# Patient Record
Sex: Female | Born: 1970 | Hispanic: Yes | Smoking: Never smoker
Health system: Southern US, Community
[De-identification: ages and names within clinical notes are randomized; demographics above are authoritative.]

## PROBLEM LIST (undated history)

## (undated) DIAGNOSIS — R7303 Prediabetes: Secondary | ICD-10-CM

## (undated) DIAGNOSIS — J45909 Unspecified asthma, uncomplicated: Secondary | ICD-10-CM

## (undated) DIAGNOSIS — F329 Major depressive disorder, single episode, unspecified: Secondary | ICD-10-CM

## (undated) DIAGNOSIS — J449 Chronic obstructive pulmonary disease, unspecified: Secondary | ICD-10-CM

## (undated) DIAGNOSIS — K219 Gastro-esophageal reflux disease without esophagitis: Secondary | ICD-10-CM

## (undated) DIAGNOSIS — G43909 Migraine, unspecified, not intractable, without status migrainosus: Secondary | ICD-10-CM

## (undated) DIAGNOSIS — F419 Anxiety disorder, unspecified: Secondary | ICD-10-CM

## (undated) DIAGNOSIS — F32A Depression, unspecified: Secondary | ICD-10-CM

## (undated) DIAGNOSIS — G5600 Carpal tunnel syndrome, unspecified upper limb: Secondary | ICD-10-CM

## (undated) DIAGNOSIS — M810 Age-related osteoporosis without current pathological fracture: Secondary | ICD-10-CM

## (undated) DIAGNOSIS — M51379 Other intervertebral disc degeneration, lumbosacral region without mention of lumbar back pain or lower extremity pain: Secondary | ICD-10-CM

## (undated) DIAGNOSIS — J42 Unspecified chronic bronchitis: Secondary | ICD-10-CM

## (undated) DIAGNOSIS — E039 Hypothyroidism, unspecified: Secondary | ICD-10-CM

## (undated) DIAGNOSIS — G4730 Sleep apnea, unspecified: Secondary | ICD-10-CM

## (undated) DIAGNOSIS — I10 Essential (primary) hypertension: Secondary | ICD-10-CM

## (undated) DIAGNOSIS — M5137 Other intervertebral disc degeneration, lumbosacral region: Secondary | ICD-10-CM

## (undated) DIAGNOSIS — E079 Disorder of thyroid, unspecified: Secondary | ICD-10-CM

## (undated) HISTORY — DX: Major depressive disorder, single episode, unspecified: F32.9

## (undated) HISTORY — DX: Depression, unspecified: F32.A

## (undated) HISTORY — DX: Other intervertebral disc degeneration, lumbosacral region: M51.37

## (undated) HISTORY — PX: EYE SURGERY: SHX253

## (undated) HISTORY — PX: FOOT SURGERY: SHX648

## (undated) HISTORY — DX: Unspecified asthma, uncomplicated: J45.909

## (undated) HISTORY — DX: Disorder of thyroid, unspecified: E07.9

## (undated) HISTORY — DX: Essential (primary) hypertension: I10

## (undated) HISTORY — DX: Other intervertebral disc degeneration, lumbosacral region without mention of lumbar back pain or lower extremity pain: M51.379

## (undated) HISTORY — DX: Anxiety disorder, unspecified: F41.9

## (undated) HISTORY — DX: Carpal tunnel syndrome, unspecified upper limb: G56.00

## (undated) HISTORY — DX: Migraine, unspecified, not intractable, without status migrainosus: G43.909

## (undated) HISTORY — PX: CARPAL TUNNEL RELEASE: SHX101

---

## 2016-02-20 IMAGING — US US THYROID
1 series · 14 of 25 positions shown · non-contrast
Comparison: None.

CLINICAL DATA: Hypothyroid. 47-year-old female with a history of
hypothyroidism and thyroid nodules. She reportedly underwent prior
thyroid nodule biopsy in [place].

EXAM:
THYROID ULTRASOUND
TECHNIQUE: Ultrasound examination of the thyroid gland and adjacent soft
tissues was performed.

[Series 1: us thyroid · 0.07mm/px · 14 of 42 slices shown]
[im 1/42]
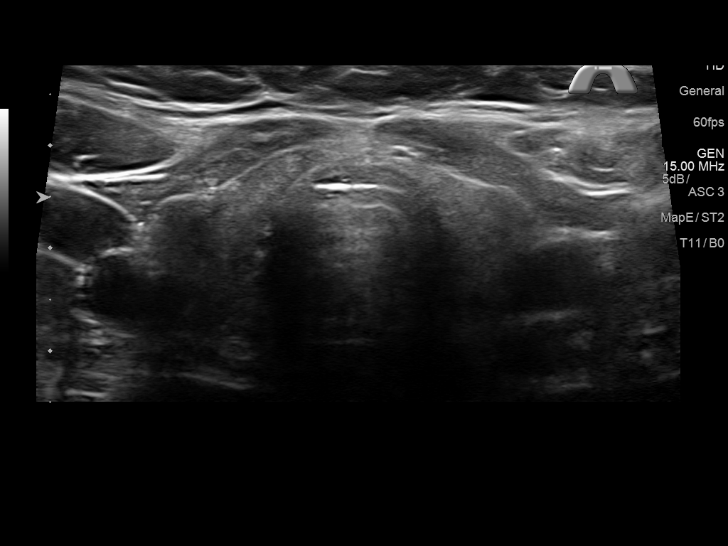
[im 4/42]
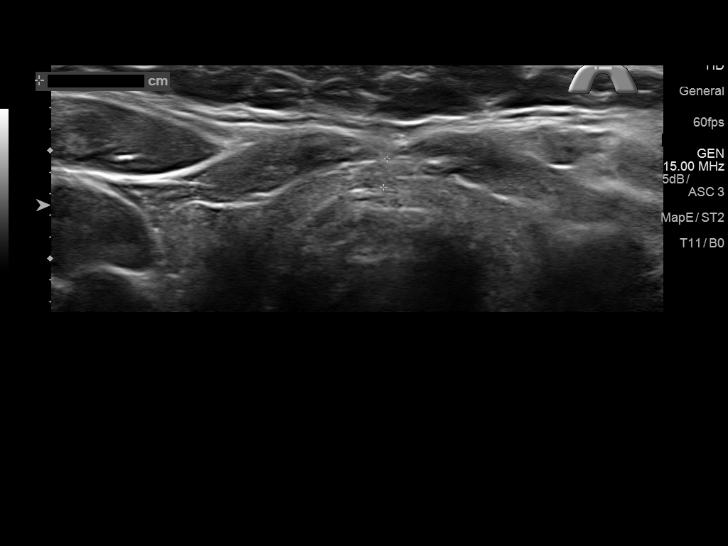
[im 7/42]
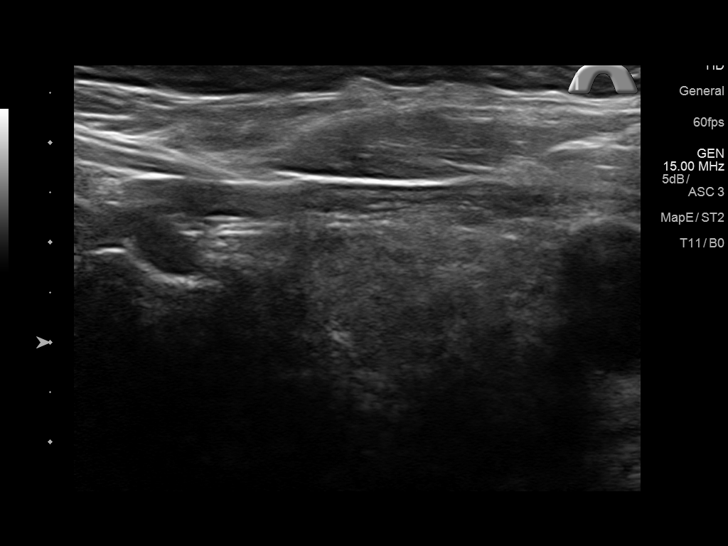
[im 11/42]
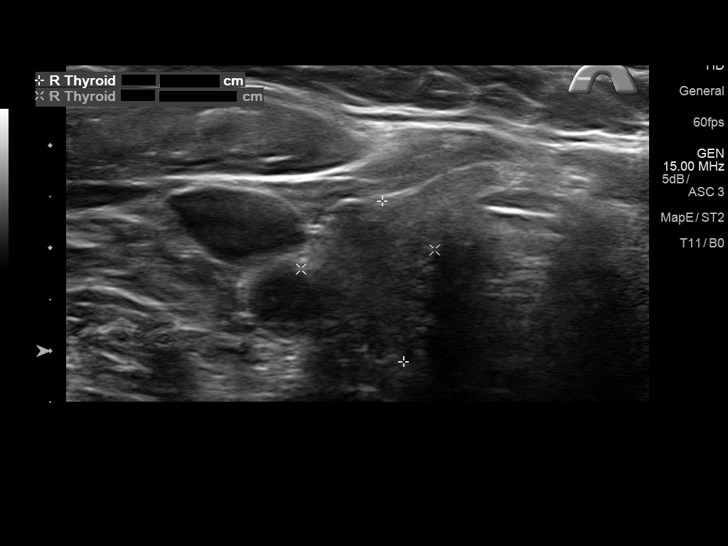
[im 14/42]
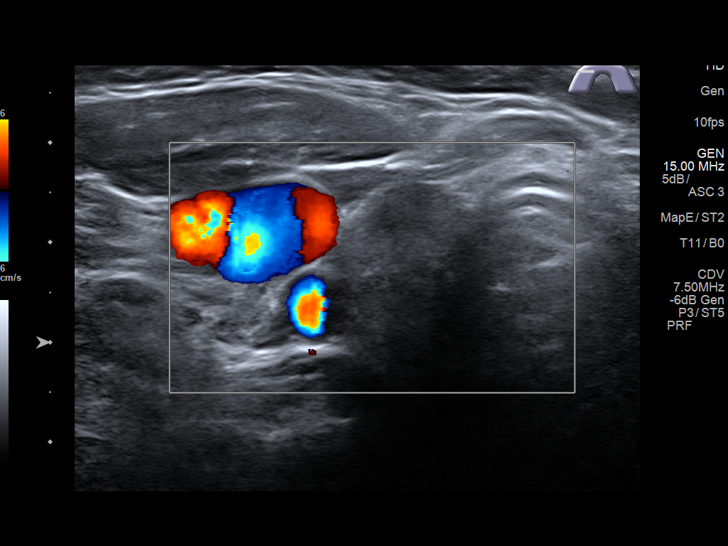
[im 16/42]
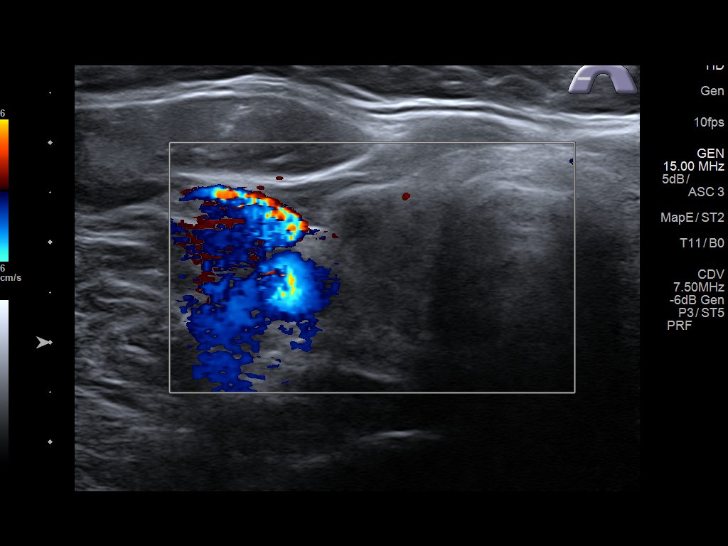
[im 19/42]
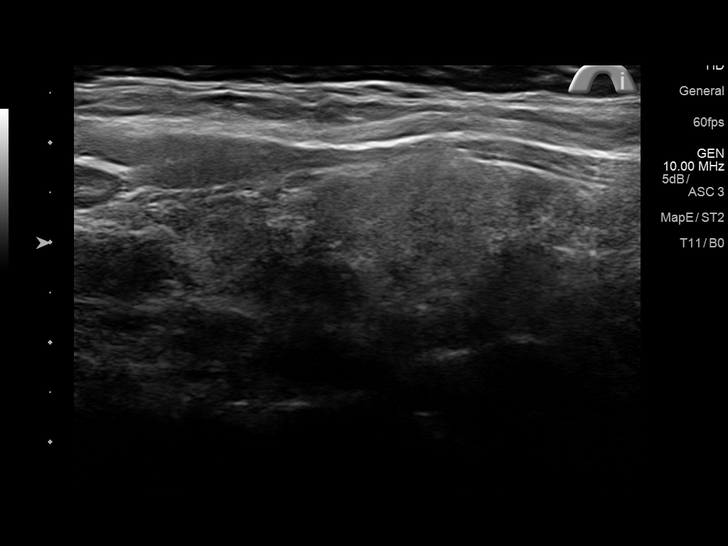
[im 23/42]
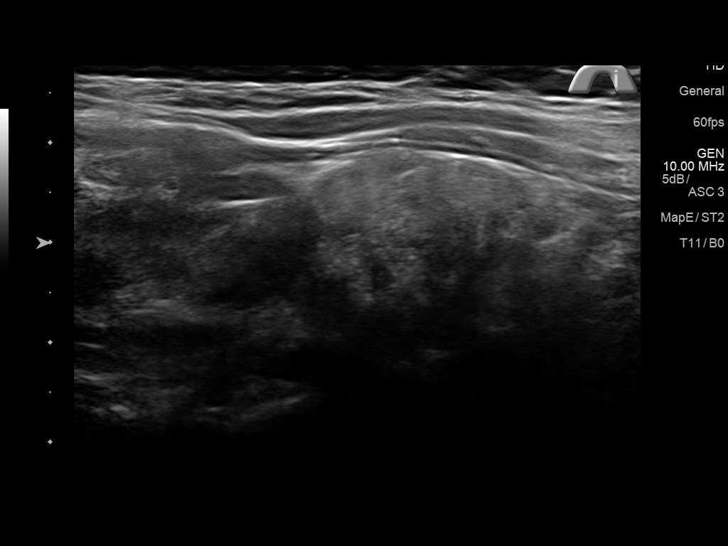
[im 26/42]
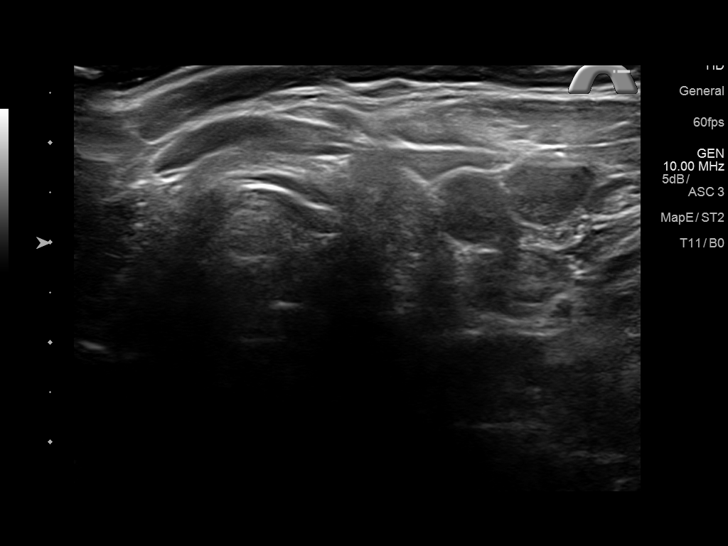
[im 28/42]
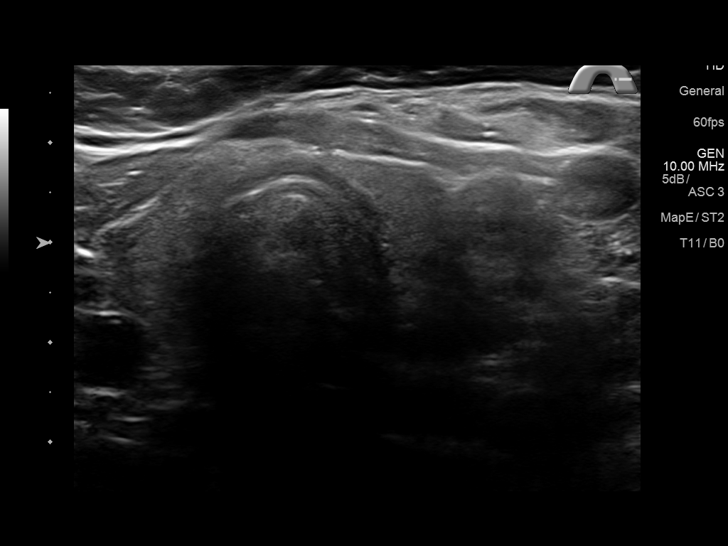
[im 31/42]
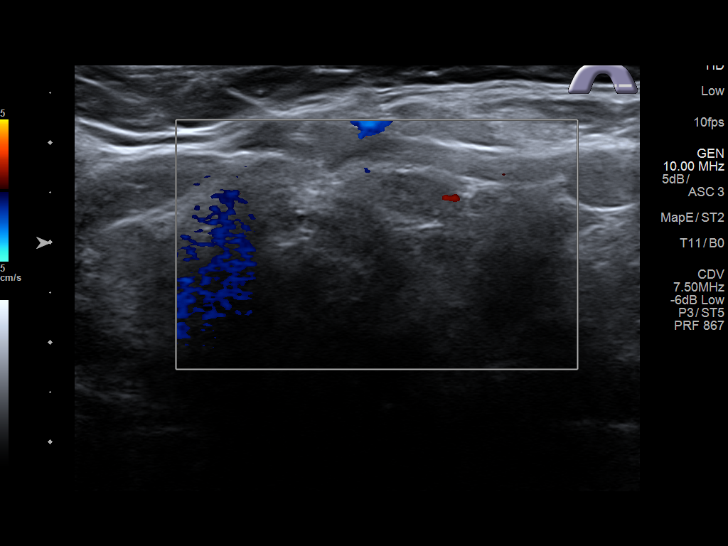
[im 35/42]
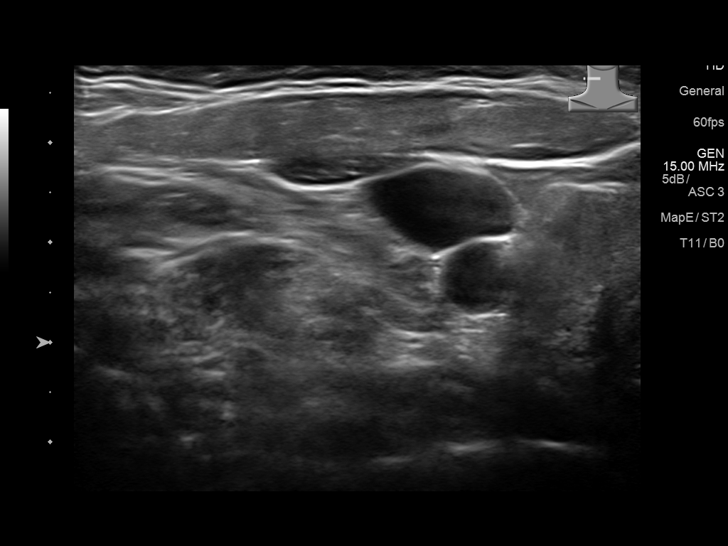
[im 38/42]
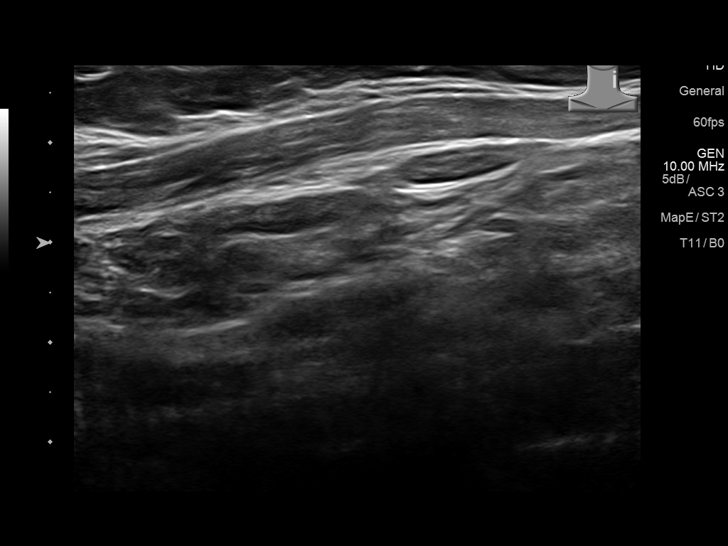
[im 42/42]
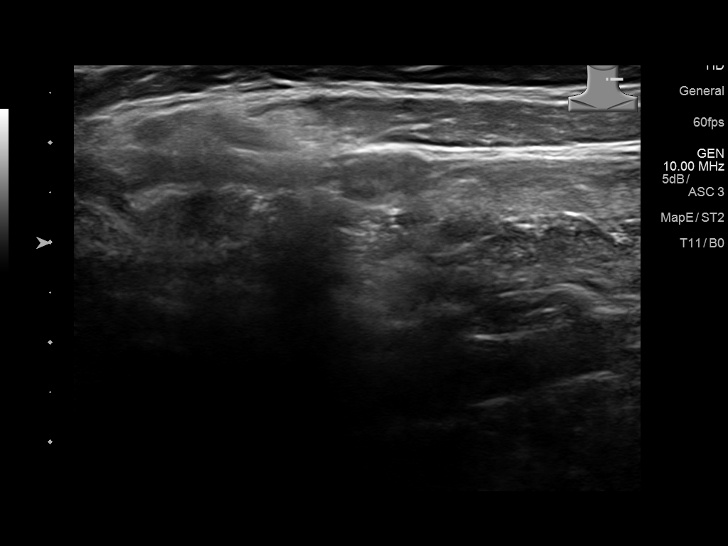

[14 of 25 positions shown; findings below may reference images not displayed]

FINDINGS: Parenchymal Echotexture: Moderately heterogenous

Isthmus: 0.3 cm

Right lobe: 3.4 x 1.6 x 1.3 cm

Left lobe: 2.6 x 1.5 x 1.0 cm

_________________________________________________________

Estimated total number of nodules >/= 1 cm: 0

Number of spongiform nodules >/=  2 cm not described below (TR1): 0

Number of mixed cystic and solid nodules >/= 1.5 cm not described
below (TR2): 0

_________________________________________________________

No discrete nodules are seen within the thyroid gland.
IMPRESSION: Diffusely heterogeneous thyroid gland. No discrete nodules are
identified.

## 2017-04-15 ENCOUNTER — Ambulatory Visit: Payer: Self-pay | Admitting: Urology

## 2017-04-15 VITALS — BP 117/84 | HR 91 | Wt 226.8 lb

## 2017-04-15 DIAGNOSIS — E038 Other specified hypothyroidism: Secondary | ICD-10-CM

## 2017-04-15 MED ORDER — CARVEDILOL 12.5 MG PO TABS: 6.2500 mg | ORAL_TABLET | Freq: Two times a day (BID) | ORAL | 3 refills | Status: DC

## 2017-04-15 MED ORDER — HYDROCHLOROTHIAZIDE 25 MG PO TABS: 12.5000 mg | ORAL_TABLET | Freq: Every day | ORAL | 2 refills | Status: DC

## 2017-04-15 MED ORDER — MONTELUKAST SODIUM 10 MG PO TABS: 10.0000 mg | ORAL_TABLET | Freq: Every day | ORAL | 3 refills | Status: DC

## 2017-04-15 MED ORDER — LEVOTHYROXINE SODIUM 100 MCG PO TABS: 100.0000 ug | ORAL_TABLET | Freq: Every day | ORAL | 3 refills | Status: DC

## 2017-04-15 NOTE — Progress Notes (Signed)
Patient: [name] Female    DOB: 03-12-1970   47 y.o.   MRN: [id number] Visit Date: 04/15/2017  Today's Provider: [place]   Chief Complaint  Patient presents with  . New Patient (Initial Visit)    upper and lower back pain, neck pain  . New Patient (Initial Visit)    medication refills    Subjective:    [name] is 47 y/o with hx of HTN, hypothyroid, asthma Had PCP in [place], who she last saw 4 months ago.   HTN BP well managed with medication regimen of coreg, HCTZ. Denies HA, chest pain, SOB. Had lipids, A1c checked a few months ago in [place], and were told were normal.  Hypothyroid Long-standing hypothyroid. Controlled on current regimen, unsure last time TSH checked.  Neck pain Started one month ago, L paraspinal, with radiation down left shoulder with movement. Pt does not do any heavy lifting, above shoulder exercise. Stretching improves pain. Pt has not tried any meds for pain.        No Known Allergies Previous Medications   ALBUTEROL (PROVENTIL HFA;VENTOLIN HFA) 108 (90 BASE) MCG/ACT INHALER    Inhale 2 puffs into the lungs every 8 (eight) hours as needed for wheezing or shortness of breath.   BUTALBITAL-ACETAMINOPHEN-CAFFEINE (FIORICET, ESGIC) 50-325-40 MG TABLET    Take 2 tablets by mouth 2 (two) times daily as needed for headache.   CARVEDILOL (COREG) 6.25 MG TABLET    Take 6.25 mg by mouth 2 (two) times daily with a meal.   CLONAZEPAM (KLONOPIN) 1 MG TABLET    Take 1 mg by mouth daily.   GABAPENTIN (NEURONTIN) 400 MG CAPSULE    Take 400 mg by mouth every morning.   HYDROCHLOROTHIAZIDE (MICROZIDE) 12.5 MG CAPSULE    Take 12.5 mg by mouth daily.   LEVOTHYROXINE (SYNTHROID, LEVOTHROID) 100 MCG TABLET    Take 100 mcg by mouth daily before breakfast.   MONTELUKAST (SINGULAIR) 10 MG TABLET    Take 10 mg by mouth at bedtime.   VENLAFAXINE XR (EFFEXOR-XR) 150 MG 24 HR CAPSULE    Take 150 mg by mouth daily with breakfast.   ZOLPIDEM (AMBIEN) 5  MG TABLET    Take 5 mg by mouth at bedtime as needed for sleep.    Review of Systems  All other systems reviewed and are negative.   Social History   Tobacco Use  . Smoking status: Never Smoker  . Smokeless tobacco: Never Used  Substance Use Topics  . Alcohol use: Yes    Comment: rarely   Objective:   BP 117/84 (BP Location: Left Arm, Patient Position: Sitting, Cuff Size: Large)   Pulse 91   Wt 226 lb 12.8 oz (102.9 kg)   Physical Exam  Constitutional: She is oriented to person, place, and time. She appears well-developed and well-nourished.  HENT:  Head: Normocephalic and atraumatic.  Eyes: Conjunctivae are normal.  Cardiovascular: Normal rate and regular rhythm. Exam reveals no gallop and no friction rub.  No murmur heard. Pulmonary/Chest: Effort normal and breath sounds normal.  Musculoskeletal: Normal range of motion.       Left shoulder: She exhibits pain. She exhibits normal range of motion, no bony tenderness and normal strength.       Arms: 5+ strength in upper extremities L cervical paraspinal tenderness to palpation. Radiating pain down L shoulder with arm movements (empty can, lift off test)  Neurological: She is alert and oriented to person, place, and time.  Skin:  Skin is warm and dry.  Psychiatric: She has a normal mood and affect.        Assessment & Plan:     [name] is a 47 y/o F with HTN, hypothyroid, asthma here for medication refill  HTN Well controlled on current regimen. Refilled meds today.  Hypothyroid Symptoms well controlled per patient. Pt to return in one week for TSH check. Refilled meds today  Neck pain Hx concerning for muscular vs shoulder impingement. Recommended NSAID for symptom relief. Referral to PT for shoulder exercises.        [facility]

## 2017-04-19 ENCOUNTER — Ambulatory Visit: Payer: Self-pay | Admitting: Pharmacy Technician

## 2017-04-19 DIAGNOSIS — Z79899 Other long term (current) drug therapy: Secondary | ICD-10-CM

## 2017-04-20 NOTE — Progress Notes (Signed)
Patient speaks Spanish.  Interpretation provided by Terrial Rhodes, ID# [id number] Interpreters. Patient stated that she has been receiving SS Disability since 2014.  She currently has Medicare Part A.  According to income information provided, patient should qualify for the Medicare Savings Program and L.I.S.  Referred patient to Dept of SS to be screened for a Medicare Savings Program and SS Administration to sign-up for L.I.S. (Low Income Subsidy).    When patient receives approval letter from [employer] regarding the Medicare Savings Program and from [place] regarding L.I.S., she is contact [facility] and schedule a Medicare Part D consult with Ellie Lunch, Pharmacy Coordinator.  [facility] will only provide medication assistance until patient's Medicare Part D plan goes into effect.    [facility]

## 2017-04-27 ENCOUNTER — Other Ambulatory Visit: Payer: Self-pay

## 2017-04-27 DIAGNOSIS — Z0000 Encounter for general adult medical examination without abnormal findings: Secondary | ICD-10-CM

## 2017-04-28 LAB — TSH: TSH: 1.88 u[IU]/mL (ref 0.450–4.500)

## 2017-05-04 ENCOUNTER — Ambulatory Visit
Admission: EM | Admit: 2017-05-04 | Discharge: 2017-05-04 | Disposition: A | Discharge status: Home / Self Care | Payer: Medicaid Other | Attending: Family Medicine | Admitting: Family Medicine

## 2017-05-04 ENCOUNTER — Encounter: Payer: Self-pay | Admitting: *Deleted

## 2017-05-04 DIAGNOSIS — J069 Acute upper respiratory infection, unspecified: Secondary | ICD-10-CM

## 2017-05-04 MED ORDER — BENZONATATE 200 MG PO CAPS: ORAL_CAPSULE | ORAL | 0 refills | Status: DC

## 2017-05-04 NOTE — ED Triage Notes (Signed)
C/o chills, cough and fever since last night. Pt does not speaks [place]

## 2017-05-04 NOTE — ED Provider Notes (Addendum)
[facility]    CSN: [id number] Arrival date & time: 05/04/17  1520     History   Chief Complaint Chief Complaint  Patient presents with  . Influenza    HPI [name] is a 47 y.o. female.   HPI  47 year old Hispanic female here recently from [place].  Translation services provided by our Spanish-speaking [place].  The patient states that she has had chills cough and fever since last night.  Currently is afebrile, pulse rate of 94, blood pressure 123/82 and O2 sats on room air 99%.  [facility] [place] [facility] for her thyroid problem . The patient has been using her albuterol inhaler.  He has also been on Tessalon Perles 100 mg 8 hours that was prescribed for her in [place] in January.  States that she does not have any wheezing.  Did not receive her influenza injection.        Past Medical History:  Diagnosis Date  . Anxiety   . Asthma   . Carpal tunnel syndrome   . Depression   . Disc degeneration, lumbosacral   . Hypertension   . Migraine   . Thyroid disease     There are no active problems to display for this patient.   Past Surgical History:  Procedure Laterality Date  . CARPAL TUNNEL RELEASE     bilateral  . CESAREAN SECTION      OB History   None      Home Medications    Prior to Admission medications   Medication Sig Start Date End Date Taking? Authorizing Provider  albuterol (PROVENTIL HFA;VENTOLIN HFA) 108 (90 Base) MCG/ACT inhaler Inhale 2 puffs into the lungs every 8 (eight) hours as needed for wheezing or shortness of breath.   Yes [provider]  carvedilol (COREG) 12.5 MG tablet Take 0.5 tablets (6.25 mg total) by mouth 2 (two) times daily with a meal. 04/15/17  Yes McGowan, Larene Beach A, PA-C  gabapentin (NEURONTIN) 400 MG capsule Take 400 mg by mouth every morning.   Yes [provider]  hydrochlorothiazide (HYDRODIURIL) 25 MG tablet Take 0.5 tablets (12.5 mg  total) by mouth daily. 04/15/17  Yes McGowan, Larene Beach A, PA-C  levothyroxine (SYNTHROID, LEVOTHROID) 100 MCG tablet Take 100 mcg by mouth daily before breakfast.   Yes [provider]  benzonatate (TESSALON) 200 MG capsule Take one cap TID PRN cough 05/04/17   Lorin Picket, PA-C  carvedilol (COREG) 6.25 MG tablet Take 6.25 mg by mouth 2 (two) times daily with a meal.    [provider]  hydrochlorothiazide (MICROZIDE) 12.5 MG capsule Take 12.5 mg by mouth daily.    [provider]  levothyroxine (SYNTHROID, LEVOTHROID) 100 MCG tablet Take 1 tablet (100 mcg total) by mouth daily before breakfast. 04/15/17   McGowan, Larene Beach A, PA-C  montelukast (SINGULAIR) 10 MG tablet Take 1 tablet (10 mg total) by mouth at bedtime. 04/15/17   McGowan, Larene Beach A, PA-C  zolpidem (AMBIEN) 5 MG tablet Take 5 mg by mouth at bedtime as needed for sleep.    [provider]    Family History Family History  Problem Relation Age of Onset  . Hypertension Mother   . Depression Mother   . Heart disease Mother   . Thyroid disease Mother   . Heart disease Father   . Heart attack Father   . Cancer Paternal Aunt   . Cancer Paternal Uncle  Social History Social History   Tobacco Use  . Smoking status: Never Smoker  . Smokeless tobacco: Never Used  Substance Use Topics  . Alcohol use: Yes    Comment: rarely  . Drug use: No     Allergies   Morphine and related   Review of Systems Review of Systems  Constitutional: Positive for activity change, appetite change, chills, fatigue and fever.  HENT: Positive for congestion.   Respiratory: Positive for cough.   All other systems reviewed and are negative.    Physical Exam Triage Vital Signs ED Triage Vitals  Enc Vitals Group     BP 05/04/17 1540 123/82     Pulse Rate 05/04/17 1540 94     Resp --      Temp 05/04/17 1540 98 F (36.7 C)     Temp Source 05/04/17 1540 Oral     SpO2 05/04/17 1540 99 %     Weight  05/04/17 1535 226 lb (102.5 kg)     Height 05/04/17 1535 5\' 2"  (1.575 m)     Head Circumference --      Peak Flow --      Pain Score 05/04/17 1534 0     Pain Loc --      Pain Edu? --      Excl. in [place]? --    No data found.  Updated Vital Signs BP 123/82 (BP Location: Left Arm)   Pulse 94   Temp 98 F (36.7 C) (Oral)   Ht 5\' 2"  (1.575 m)   Wt 226 lb (102.5 kg)   LMP 04/18/2017   SpO2 99%   BMI 41.34 kg/m   Visual Acuity Right Eye Distance:   Left Eye Distance:   Bilateral Distance:    Right Eye Near:   Left Eye Near:    Bilateral Near:     Physical Exam  Constitutional: She is oriented to person, place, and time. She appears well-developed and well-nourished. No distress.  HENT:  Head: Normocephalic.  Right Ear: External ear normal.  Left Ear: External ear normal.  Nose: Nose normal.  Mouth/Throat: Oropharynx is clear and moist. No oropharyngeal exudate.  Eyes: Pupils are equal, round, and reactive to light. Right eye exhibits no discharge. Left eye exhibits no discharge.  Neck: Normal range of motion.  Pulmonary/Chest: Effort normal and breath sounds normal.  Musculoskeletal: Normal range of motion.  Lymphadenopathy:    She has no cervical adenopathy.  Neurological: She is alert and oriented to person, place, and time.  Skin: Skin is warm and dry. She is not diaphoretic.  Psychiatric: She has a normal mood and affect. Her behavior is normal. Judgment and thought content normal.  Nursing note and vitals reviewed.    UC Treatments / Results  Labs (all labs ordered are listed, but only abnormal results are displayed) Labs Reviewed - No data to display  EKG None Radiology No results found.  Procedures Procedures (including critical care time)  Medications Ordered in UC Medications - No data to display   Initial Impression / Assessment and Plan / UC Course  I have reviewed the triage vital signs and the nursing notes.  Pertinent labs & imaging  results that were available during my care of the patient were reviewed by me and considered in my medical decision making (see chart for details).     Plan: 1. Test/x-ray results and diagnosis reviewed with patient 2. rx as per orders; risks, benefits, potential side effects reviewed with patient 3.  Recommend supportive treatment with Tessalon Perles.  Reviewed use of the albuterol inhaler as necessary.  If she has increasing sputum production or high fevers she should return to our clinic or go to the emergency room. 4. F/u prn if symptoms worsen or don't improve   Final Clinical Impressions(s) / UC Diagnoses   Final diagnoses:  Upper respiratory tract infection, unspecified type    ED Discharge Orders        Ordered    benzonatate (TESSALON) 200 MG capsule     05/04/17 1558       Controlled Substance Prescriptions [place] Controlled Substance Registry consulted? Not Applicable   Lorin Picket, PA-C 05/04/17 1616    Lorin Picket, PA-C 05/04/17 1621

## 2017-05-11 ENCOUNTER — Telehealth: Payer: Self-pay

## 2017-05-11 NOTE — Telephone Encounter (Signed)
-----   Message from Marcie Mowers sent at 04/28/2017 11:48 AM EDT ----- Regarding: FW: Thyroid lab   ----- Message ----- From: Tawni Millers, MD Sent: 04/28/2017   9:00 AM To: Marcie Mowers Subject: Thyroid lab                                    Test in normal range; current Thyroid Rx dose correct..stay on Rx. ----- Message ----- From: Interface, [facility] Lab Results In Sent: 04/28/2017   7:37 AM To: Bod Results Pool

## 2017-05-11 NOTE — Telephone Encounter (Signed)
Ph number is not in service. Unable to give pt results

## 2017-05-17 ENCOUNTER — Encounter (INDEPENDENT_AMBULATORY_CARE_PROVIDER_SITE_OTHER): Payer: Self-pay

## 2017-05-17 ENCOUNTER — Ambulatory Visit: Payer: Self-pay | Admitting: Pharmacy Technician

## 2017-05-17 DIAGNOSIS — Z79899 Other long term (current) drug therapy: Secondary | ICD-10-CM

## 2017-05-17 NOTE — Progress Notes (Signed)
Patient will be eligible for Medicare B & D 08/02/17.  Helped patient complete online application for [employer] and L.I.S.  Patient understood that [facility] will no longer be able to provide medication assistance once her Medicare Part D goes into effect.  [facility]

## 2017-06-17 DIAGNOSIS — E039 Hypothyroidism, unspecified: Secondary | ICD-10-CM | POA: Insufficient documentation

## 2017-06-17 DIAGNOSIS — F329 Major depressive disorder, single episode, unspecified: Secondary | ICD-10-CM | POA: Insufficient documentation

## 2017-06-17 DIAGNOSIS — I10 Essential (primary) hypertension: Secondary | ICD-10-CM | POA: Insufficient documentation

## 2017-07-13 ENCOUNTER — Ambulatory Visit: Payer: Self-pay

## 2017-08-04 ENCOUNTER — Telehealth: Payer: Self-pay | Admitting: Pharmacy Technician

## 2017-08-04 NOTE — Telephone Encounter (Signed)
Patient has Medicare Part D.  No longer meets [facility] eligibility criteria.  Patient acknowledged that she understood that [facility] could no longer provide medication assistance now that she has prescription drug coverage.  [facility]

## 2017-08-06 ENCOUNTER — Ambulatory Visit
Admission: EM | Admit: 2017-08-06 | Discharge: 2017-08-06 | Disposition: A | Discharge status: Home / Self Care | Payer: Medicare HMO | Attending: Family Medicine | Admitting: Family Medicine

## 2017-08-06 ENCOUNTER — Encounter: Payer: Self-pay | Admitting: Emergency Medicine

## 2017-08-06 ENCOUNTER — Other Ambulatory Visit: Payer: Self-pay

## 2017-08-06 DIAGNOSIS — R05 Cough: Secondary | ICD-10-CM

## 2017-08-06 DIAGNOSIS — R059 Cough, unspecified: Secondary | ICD-10-CM

## 2017-08-06 MED ORDER — HYDROCOD POLST-CPM POLST ER 10-8 MG/5ML PO SUER: 5.0000 mL | Freq: Two times a day (BID) | ORAL | 0 refills | Status: DC | PRN

## 2017-08-06 MED ORDER — PREDNISONE 50 MG PO TABS: ORAL_TABLET | ORAL | 0 refills | Status: DC

## 2017-08-06 NOTE — Discharge Instructions (Signed)
Medications as prescribed. ° °Take care ° °Dr. Brittini Brubeck  °

## 2017-08-06 NOTE — ED Triage Notes (Signed)
Patient in today c/o cough and sob x 4 days. Patient has a history of asthma. Patient denies fever. Patient has tried her Ventolin inhaler, Tussin, Tessalon Perles and Prednisone left over from previous illness.

## 2017-08-06 NOTE — ED Provider Notes (Signed)
[facility]    CSN: [id number] Arrival date & time: 08/06/17  1641  History   Chief Complaint Chief Complaint  Patient presents with  . Cough   HPI  47 year old female presents with cough.  Patient reports a 4-day history of cough.  Patient states that her cough is intermittently dry and productive.  No fever.  She does endorse shortness of breath.  Cough is worse at night.  She has taken her albuterol inhaler, Tessalon Perles, and 1 dose of prednisone without resolution.  No known inciting factor.  No other associated symptoms.  No other complaints.  Past Medical History:  Diagnosis Date  . Anxiety   . Asthma   . Carpal tunnel syndrome   . Depression   . Disc degeneration, lumbosacral   . Hypertension   . Migraine   . Thyroid disease    Past Surgical History:  Procedure Laterality Date  . CARPAL TUNNEL RELEASE     bilateral  . CESAREAN SECTION      OB History   None      Home Medications    Prior to Admission medications   Medication Sig Start Date End Date Taking? Authorizing Provider  albuterol (PROVENTIL HFA;VENTOLIN HFA) 108 (90 Base) MCG/ACT inhaler Inhale 2 puffs into the lungs every 8 (eight) hours as needed for wheezing or shortness of breath.   Yes [provider]  benzonatate (TESSALON) 200 MG capsule Take one cap TID PRN cough 05/04/17  Yes Crecencio Mc P, PA-C  carvedilol (COREG) 6.25 MG tablet Take 6.25 mg by mouth 2 (two) times daily with a meal.   Yes [provider]  clonazePAM (KLONOPIN) 1 MG tablet Take 1 tablet by mouth daily as needed for anxiety. 07/15/17  Yes [provider]  gabapentin (NEURONTIN) 400 MG capsule Take 400 mg by mouth every morning.   Yes [provider]  hydrochlorothiazide (MICROZIDE) 12.5 MG capsule Take 12.5 mg by mouth daily.   Yes [provider]  levothyroxine (SYNTHROID, LEVOTHROID) 100 MCG tablet Take 1 tablet (100 mcg total) by mouth daily before breakfast.  04/15/17  Yes McGowan, Larene Beach A, PA-C  venlafaxine XR (EFFEXOR-XR) 150 MG 24 hr capsule Take 1 capsule by mouth daily. 07/15/17  Yes [provider]  chlorpheniramine-HYDROcodone (TUSSIONEX PENNKINETIC ER) 10-8 MG/5ML SUER Take 5 mLs by mouth every 12 (twelve) hours as needed. 08/06/17   Coral Spikes, DO  predniSONE (DELTASONE) 50 MG tablet 1 tablet daily x 5 days. 08/06/17   Coral Spikes, DO    Family History Family History  Problem Relation Age of Onset  . Hypertension Mother   . Depression Mother   . Heart disease Mother   . Thyroid disease Mother   . Heart disease Father   . Heart attack Father   . Cancer Paternal Aunt   . Cancer Paternal Uncle     Social History Social History   Tobacco Use  . Smoking status: Never Smoker  . Smokeless tobacco: Never Used  Substance Use Topics  . Alcohol use: Yes    Comment: rarely  . Drug use: No     Allergies   Morphine and related   Review of Systems Review of Systems  Constitutional: Negative for fever.  Respiratory: Positive for cough and shortness of breath.    Physical Exam Triage Vital Signs ED Triage Vitals [08/06/17 1653]  Enc Vitals Group     BP (!) 137/93     Pulse Rate (!) 103  Resp 20     Temp 98.1 F (36.7 C)     Temp Source Oral     SpO2 97 %     Weight 226 lb (102.5 kg)     Height 5\' 1"  (1.549 m)     Head Circumference      Peak Flow      Pain Score 0     Pain Loc      Pain Edu?      Excl. in [place]?    Updated Vital Signs BP (!) 137/93 (BP Location: Left Arm)   Pulse (!) 103   Temp 98.1 F (36.7 C) (Oral)   Resp 20   Ht 5\' 1"  (1.549 m)   Wt 226 lb (102.5 kg)   LMP 07/16/2017   SpO2 97%   BMI 42.70 kg/m     Physical Exam  Constitutional: She is oriented to person, place, and time. She appears well-developed. No distress.  HENT:  Head: Normocephalic and atraumatic.  Mouth/Throat: Oropharynx is clear and moist.  Cardiovascular:  Tachycardic.  Regular rhythm.  Pulmonary/Chest:  Effort normal. No respiratory distress.  Coarse breath sounds.  Neurological: She is alert and oriented to person, place, and time.  Psychiatric: She has a normal mood and affect. Her behavior is normal.  Nursing note and vitals reviewed.  UC Treatments / Results  Labs (all labs ordered are listed, but only abnormal results are displayed) Labs Reviewed - No data to display  EKG None  Radiology No results found.  Procedures Procedures (including critical care time)  Medications Ordered in UC Medications - No data to display  Initial Impression / Assessment and Plan / UC Course  I have reviewed the triage vital signs and the nursing notes.  Pertinent labs & imaging results that were available during my care of the patient were reviewed by me and considered in my medical decision making (see chart for details).    47 year old female presents with cough.  Given asthma, treating with prednisone.  Tussionex for cough.  Patient informed me that she has an allergy to morphine but is able to take prescription cough syrups.  Final Clinical Impressions(s) / UC Diagnoses   Final diagnoses:  Cough     Discharge Instructions     Medications as prescribed.  Take care  Dr. Lacinda Axon    ED Prescriptions    Medication Sig Dispense Auth. Provider   predniSONE (DELTASONE) 50 MG tablet 1 tablet daily x 5 days. 5 tablet Puckett, Leler Brion G, DO   chlorpheniramine-HYDROcodone (TUSSIONEX PENNKINETIC ER) 10-8 MG/5ML SUER Take 5 mLs by mouth every 12 (twelve) hours as needed. 115 mL Coral Spikes, DO     Controlled Substance Prescriptions [place] Controlled Substance Registry consulted? Not Applicable   Coral Spikes, DO 08/06/17 1718

## 2017-08-24 ENCOUNTER — Other Ambulatory Visit: Payer: Self-pay | Admitting: Family Medicine

## 2017-08-24 DIAGNOSIS — E041 Nontoxic single thyroid nodule: Secondary | ICD-10-CM

## 2017-08-31 ENCOUNTER — Ambulatory Visit
Admission: RE | Admit: 2017-08-31 | Discharge: 2017-08-31 | Disposition: A | Discharge status: Home / Self Care | Payer: Medicare HMO | Source: Ambulatory Visit | Attending: Family Medicine | Admitting: Family Medicine

## 2017-08-31 DIAGNOSIS — E041 Nontoxic single thyroid nodule: Secondary | ICD-10-CM

## 2017-08-31 DIAGNOSIS — E039 Hypothyroidism, unspecified: Secondary | ICD-10-CM | POA: Diagnosis present

## 2017-09-15 ENCOUNTER — Other Ambulatory Visit: Payer: Self-pay | Admitting: Family Medicine

## 2017-09-15 DIAGNOSIS — Z1231 Encounter for screening mammogram for malignant neoplasm of breast: Secondary | ICD-10-CM

## 2017-09-28 ENCOUNTER — Encounter (INDEPENDENT_AMBULATORY_CARE_PROVIDER_SITE_OTHER): Payer: Self-pay

## 2017-09-28 ENCOUNTER — Ambulatory Visit
Admission: RE | Admit: 2017-09-28 | Discharge: 2017-09-28 | Disposition: A | Discharge status: Home / Self Care | Payer: Medicare HMO | Source: Ambulatory Visit | Attending: Family Medicine | Admitting: Family Medicine

## 2017-09-28 DIAGNOSIS — Z1231 Encounter for screening mammogram for malignant neoplasm of breast: Secondary | ICD-10-CM | POA: Insufficient documentation

## 2017-10-20 ENCOUNTER — Ambulatory Visit: Payer: Medicare HMO | Admitting: Anesthesiology

## 2017-10-20 ENCOUNTER — Encounter
Admission: RE | Disposition: A | Discharge status: Home / Self Care | Payer: Self-pay | Source: Ambulatory Visit | Attending: Unknown Physician Specialty

## 2017-10-20 ENCOUNTER — Encounter: Payer: Self-pay | Admitting: *Deleted

## 2017-10-20 ENCOUNTER — Other Ambulatory Visit: Payer: Self-pay

## 2017-10-20 ENCOUNTER — Ambulatory Visit
Admission: RE | Admit: 2017-10-20 | Discharge: 2017-10-20 | Disposition: A | Discharge status: Home / Self Care | Payer: Medicare HMO | Source: Ambulatory Visit | Attending: Unknown Physician Specialty | Admitting: Unknown Physician Specialty

## 2017-10-20 DIAGNOSIS — Z6841 Body Mass Index (BMI) 40.0 and over, adult: Secondary | ICD-10-CM | POA: Insufficient documentation

## 2017-10-20 DIAGNOSIS — Z79890 Hormone replacement therapy (postmenopausal): Secondary | ICD-10-CM | POA: Insufficient documentation

## 2017-10-20 DIAGNOSIS — Z7951 Long term (current) use of inhaled steroids: Secondary | ICD-10-CM | POA: Diagnosis not present

## 2017-10-20 DIAGNOSIS — Z79899 Other long term (current) drug therapy: Secondary | ICD-10-CM | POA: Diagnosis not present

## 2017-10-20 DIAGNOSIS — G5603 Carpal tunnel syndrome, bilateral upper limbs: Secondary | ICD-10-CM | POA: Insufficient documentation

## 2017-10-20 DIAGNOSIS — M25541 Pain in joints of right hand: Secondary | ICD-10-CM | POA: Diagnosis present

## 2017-10-20 DIAGNOSIS — J45909 Unspecified asthma, uncomplicated: Secondary | ICD-10-CM | POA: Insufficient documentation

## 2017-10-20 DIAGNOSIS — I10 Essential (primary) hypertension: Secondary | ICD-10-CM | POA: Insufficient documentation

## 2017-10-20 DIAGNOSIS — F329 Major depressive disorder, single episode, unspecified: Secondary | ICD-10-CM | POA: Diagnosis not present

## 2017-10-20 DIAGNOSIS — E039 Hypothyroidism, unspecified: Secondary | ICD-10-CM | POA: Diagnosis not present

## 2017-10-20 HISTORY — PX: CARPAL TUNNEL RELEASE: SHX101

## 2017-10-20 LAB — POCT PREGNANCY, URINE: Preg Test, Ur: NEGATIVE

## 2017-10-20 SURGERY — CARPAL TUNNEL RELEASE
Anesthesia: General | Site: Arm Lower | Laterality: Right

## 2017-10-20 MED ORDER — PROPOFOL 10 MG/ML IV BOLUS
INTRAVENOUS | Status: AC
  Filled 2017-10-20: qty 20

## 2017-10-20 MED ORDER — TRAMADOL HCL 50 MG PO TABS
ORAL_TABLET | ORAL | Status: AC
  Administered 2017-10-20: 50 mg via ORAL
  Filled 2017-10-20: qty 1

## 2017-10-20 MED ORDER — DEXAMETHASONE SODIUM PHOSPHATE 10 MG/ML IJ SOLN
INTRAMUSCULAR | Status: DC | PRN
  Administered 2017-10-20: 5 mg via INTRAVENOUS

## 2017-10-20 MED ORDER — BUPIVACAINE HCL (PF) 0.5 % IJ SOLN
INTRAMUSCULAR | Status: AC
  Filled 2017-10-20: qty 30

## 2017-10-20 MED ORDER — LACTATED RINGERS IV SOLN
INTRAVENOUS | Status: DC
  Administered 2017-10-20: 08:00:00 via INTRAVENOUS

## 2017-10-20 MED ORDER — DEXAMETHASONE SODIUM PHOSPHATE 10 MG/ML IJ SOLN
INTRAMUSCULAR | Status: AC
  Filled 2017-10-20: qty 1

## 2017-10-20 MED ORDER — TRAMADOL HCL 50 MG PO TABS
50.0000 mg | ORAL_TABLET | Freq: Once | ORAL | Status: AC
  Administered 2017-10-20: 50 mg via ORAL
  Filled 2017-10-20: qty 1

## 2017-10-20 MED ORDER — EPHEDRINE SULFATE 50 MG/ML IJ SOLN
INTRAMUSCULAR | Status: DC | PRN
  Administered 2017-10-20 (×2): 5 mg via INTRAVENOUS

## 2017-10-20 MED ORDER — FENTANYL CITRATE (PF) 100 MCG/2ML IJ SOLN
INTRAMUSCULAR | Status: AC
  Administered 2017-10-20: 25 ug via INTRAVENOUS
  Filled 2017-10-20: qty 2

## 2017-10-20 MED ORDER — PHENYLEPHRINE HCL 10 MG/ML IJ SOLN
INTRAMUSCULAR | Status: DC | PRN
  Administered 2017-10-20 (×4): 100 ug via INTRAVENOUS
  Administered 2017-10-20: 200 ug via INTRAVENOUS
  Administered 2017-10-20 (×2): 100 ug via INTRAVENOUS
  Administered 2017-10-20: 200 ug via INTRAVENOUS
  Administered 2017-10-20: 100 ug via INTRAVENOUS

## 2017-10-20 MED ORDER — SUCCINYLCHOLINE CHLORIDE 20 MG/ML IJ SOLN
INTRAMUSCULAR | Status: AC
  Filled 2017-10-20: qty 1

## 2017-10-20 MED ORDER — ALBUTEROL SULFATE HFA 108 (90 BASE) MCG/ACT IN AERS
INHALATION_SPRAY | RESPIRATORY_TRACT | Status: AC
  Filled 2017-10-20: qty 6.7

## 2017-10-20 MED ORDER — MIDAZOLAM HCL 2 MG/2ML IJ SOLN
INTRAMUSCULAR | Status: DC | PRN
  Administered 2017-10-20: 2 mg via INTRAVENOUS

## 2017-10-20 MED ORDER — FENTANYL CITRATE (PF) 100 MCG/2ML IJ SOLN
INTRAMUSCULAR | Status: AC
  Filled 2017-10-20: qty 2

## 2017-10-20 MED ORDER — FENTANYL CITRATE (PF) 100 MCG/2ML IJ SOLN: INTRAMUSCULAR | Status: DC | PRN

## 2017-10-20 MED ORDER — ONDANSETRON HCL 4 MG/2ML IJ SOLN
INTRAMUSCULAR | Status: AC
  Filled 2017-10-20: qty 2

## 2017-10-20 MED ORDER — FENTANYL CITRATE (PF) 100 MCG/2ML IJ SOLN
25.0000 ug | INTRAMUSCULAR | Status: DC | PRN
  Administered 2017-10-20 (×4): 25 ug via INTRAVENOUS

## 2017-10-20 MED ORDER — ONDANSETRON HCL 4 MG/2ML IJ SOLN
INTRAMUSCULAR | Status: DC | PRN
  Administered 2017-10-20: 4 mg via INTRAVENOUS

## 2017-10-20 MED ORDER — FENTANYL CITRATE (PF) 100 MCG/2ML IJ SOLN
INTRAMUSCULAR | Status: DC | PRN
  Administered 2017-10-20 (×2): 25 ug via INTRAVENOUS
  Administered 2017-10-20: 50 ug via INTRAVENOUS

## 2017-10-20 MED ORDER — ALBUTEROL SULFATE HFA 108 (90 BASE) MCG/ACT IN AERS
INHALATION_SPRAY | RESPIRATORY_TRACT | Status: DC | PRN
  Administered 2017-10-20: 6 via RESPIRATORY_TRACT

## 2017-10-20 MED ORDER — MIDAZOLAM HCL 2 MG/2ML IJ SOLN
INTRAMUSCULAR | Status: AC
  Filled 2017-10-20: qty 2

## 2017-10-20 MED ORDER — BUPIVACAINE HCL (PF) 0.5 % IJ SOLN
INTRAMUSCULAR | Status: DC | PRN
  Administered 2017-10-20: 4 mL

## 2017-10-20 MED ORDER — PROPOFOL 10 MG/ML IV BOLUS
INTRAVENOUS | Status: DC | PRN
  Administered 2017-10-20: 200 mg via INTRAVENOUS

## 2017-10-20 MED ORDER — LIDOCAINE HCL (CARDIAC) PF 100 MG/5ML IV SOSY
PREFILLED_SYRINGE | INTRAVENOUS | Status: DC | PRN
  Administered 2017-10-20: 100 mg via INTRAVENOUS

## 2017-10-20 SURGICAL SUPPLY — 26 items
BANDAGE ELASTIC 2 LF NS (GAUZE/BANDAGES/DRESSINGS) ×4 IMPLANT
BNDG ESMARK 4X12 TAN STRL LF (GAUZE/BANDAGES/DRESSINGS) ×2 IMPLANT
CHLORAPREP W/TINT 26ML (MISCELLANEOUS) ×2 IMPLANT
CUFF TOURN 18 STER (MISCELLANEOUS) IMPLANT
CUFF TOURN 24 STER (MISCELLANEOUS) ×2 IMPLANT
ELECT REM PT RETURN 9FT ADLT (ELECTROSURGICAL) ×2
ELECTRODE REM PT RTRN 9FT ADLT (ELECTROSURGICAL) ×1 IMPLANT
GAUZE SPONGE 4X4 12PLY STRL (GAUZE/BANDAGES/DRESSINGS) ×2 IMPLANT
GLOVE BIO SURGEON STRL SZ8 (GLOVE) ×2 IMPLANT
GLOVE BIOGEL M STRL SZ7.5 (GLOVE) ×2 IMPLANT
GLOVE INDICATOR 8.0 STRL GRN (GLOVE) ×2 IMPLANT
GOWN STRL REUS W/ TWL LRG LVL3 (GOWN DISPOSABLE) ×2 IMPLANT
GOWN STRL REUS W/TWL LRG LVL3 (GOWN DISPOSABLE) ×2
GOWN STRL REUS W/TWL LRG LVL4 (GOWN DISPOSABLE) ×2 IMPLANT
KIT TURNOVER KIT A (KITS) ×2 IMPLANT
NS IRRIG 500ML POUR BTL (IV SOLUTION) ×2 IMPLANT
PACK EXTREMITY ARMC (MISCELLANEOUS) ×2 IMPLANT
PADDING CAST 2X4YD ST (MISCELLANEOUS) ×1
PADDING CAST BLEND 2X4 STRL (MISCELLANEOUS) ×1 IMPLANT
SOL PREP PVP 2OZ (MISCELLANEOUS) ×2
SOLUTION PREP PVP 2OZ (MISCELLANEOUS) ×1 IMPLANT
SPLINT CAST 1 STEP 3X12 (MISCELLANEOUS) ×2 IMPLANT
STOCKINETTE STRL 4IN 9604848 (GAUZE/BANDAGES/DRESSINGS) ×2 IMPLANT
SUT ETHILON 4-0 (SUTURE) ×1
SUT ETHILON 4-0 FS2 18XMFL BLK (SUTURE) ×1
SUTURE ETHLN 4-0 FS2 18XMF BLK (SUTURE) ×1 IMPLANT

## 2017-10-20 NOTE — Discharge Instructions (Addendum)

## 2017-10-20 NOTE — Op Note (Signed)
DATE OF SURGERY:  10/20/2017  PATIENT NAME:  [name]   DOB: Dec 17, 1970  MRN: [id number]  PRE-OPERATIVE DIAGNOSIS: Right carpal tunnel syndrome  POST-OPERATIVE DIAGNOSIS:  Same  PROCEDURE: Right carpal tunnel release  SURGEON: Dr. Leanor Kail, Brooke Bonito. M.D.  ANESTHESIA: General   INDICATIONS FOR SURGERY: [name] is a 47 y.o. year old female with a long history of numbness and paresthesias in the right hand. Nerve conduction studies demonstrated findings consistent with  median nerve compression.The patient had not seen any significant improvement despite conservative nonsurgical intervention. After discussion of the risks and benefits of surgical intervention, the patient expressed understanding of the risks benefits and agreed with plans for carpal tunnel release.   PROCEDURE IN DETAIL: The patient was taken the operating room where satisfactory general anesthesia was achieved. A tourniquet was placed on the patient's right upper arm.The right hand and arm were prepped  and draped in the usual sterile fashion. A "time-out" was performed as per usual protocol. The hand and forearm were exsanguinated using an Esmarch and the tourniquet was inflated to 250 mmHg.  An incision was made just ulnar to the thenar palmar crease. Dissection was carried down through the palmar fascia to the transverse carpal ligament. The transverse carpal ligament was sharply incised, taking care to protect the underlying structures within the carpal tunnel. Complete release of the transverse carpal ligament was achieved. There was no evidence of a mass or proliferative synovitis within the carpal tunnel.  There was evidence, however, of some scarring around the median nerve.  Scar tissue was freed up as part of the release.  The median nerve did appear to be slightly flattened. The wound was irrigated with saline. The tourniquet was released at this time. It had been up for about 10 minutes.  Bleeding was controlled with digital pressure and coagulation cautery. I did inject the subcutaneous tissue of the wound with about 5 cc of 0.5% Marcaine without epinephrine. The skin was  re-approximated with interrupted sutures of #4-0 nylon. A sterile dressing was applied followed by application of a volar splint.  The patient was awakened and transferred to a stretcher bed.  The patient tolerated the procedure well and was transported to the PACU in stable condition. Blood loss was negligible.  Dr. Mariel Kansky. M.D.

## 2017-10-20 NOTE — Transfer of Care (Signed)
Immediate Anesthesia Transfer of Care Note  Patient: Lenoria Narine [place]  Procedure(s) Performed: CARPAL TUNNEL RELEASE (Right Arm Lower)  Patient Location: PACU  Anesthesia Type:General  Level of Consciousness: awake and patient cooperative  Airway & Oxygen Therapy: Patient Spontanous Breathing and Patient connected to face mask oxygen  Post-op Assessment: Report given to RN and Post -op Vital signs reviewed and stable  Post vital signs: Reviewed and stable  Last Vitals:  Vitals Value Taken Time  BP 158/89 10/20/2017 10:30 AM  Temp 37.1 C 10/20/2017 10:30 AM  Pulse 101 10/20/2017 10:32 AM  Resp 31 10/20/2017 10:32 AM  SpO2 100 % 10/20/2017 10:32 AM  Vitals shown include unvalidated device data.  Last Pain:  Vitals:   10/20/17 0806  TempSrc: Tympanic  PainSc: 10-Worst pain ever      Patients Stated Pain Goal: 0 (28/83/37 4451)  Complications: No apparent anesthesia complications

## 2017-10-20 NOTE — H&P (Signed)
  H and P reviewed. No changes. Uploaded at later date. 

## 2017-10-20 NOTE — Anesthesia Procedure Notes (Signed)
Procedure Name: LMA Insertion Date/Time: 10/20/2017 9:14 AM Performed by: Rudean Hitt, CRNA Pre-anesthesia Checklist: Patient identified, Patient being monitored, Timeout performed, Emergency Drugs available and Suction available Patient Re-evaluated:Patient Re-evaluated prior to induction Oxygen Delivery Method: Circle system utilized Preoxygenation: Pre-oxygenation with 100% oxygen Induction Type: IV induction Ventilation: Mask ventilation without difficulty LMA: LMA inserted LMA Size: 3.5 Tube type: Oral Number of attempts: 1 Placement Confirmation: positive ETCO2 and breath sounds checked- equal and bilateral Tube secured with: Tape Dental Injury: Teeth and Oropharynx as per pre-operative assessment

## 2017-10-20 NOTE — Anesthesia Post-op Follow-up Note (Signed)
Anesthesia QCDR form completed.        

## 2017-10-20 NOTE — Anesthesia Preprocedure Evaluation (Addendum)
Anesthesia Evaluation  Patient identified by MRN, date of birth, ID band Patient awake    Reviewed: Allergy & Precautions, H&P , NPO status , Patient's Chart, lab work & pertinent test results  Airway Mallampati: III   Neck ROM: full   Comment: TM 3 FB Dental  (+) Teeth Intact   Pulmonary neg pulmonary ROS, asthma ,    breath sounds clear to auscultation       Cardiovascular hypertension, negative cardio ROS   Rhythm:regular Rate:Normal     Neuro/Psych  Headaches, PSYCHIATRIC DISORDERS Anxiety Depression  Neuromuscular disease negative neurological ROS  negative psych ROS   GI/Hepatic negative GI ROS, Neg liver ROS,   Endo/Other  negative endocrine ROSMorbid obesity  Renal/GU      Musculoskeletal  (+) Arthritis ,   Abdominal   Peds  Hematology negative hematology ROS (+)   Anesthesia Other Findings Past Medical History: No date: Anxiety No date: Asthma No date: Carpal tunnel syndrome No date: Depression No date: Disc degeneration, lumbosacral No date: Hypertension No date: Migraine No date: Thyroid disease  Past Surgical History: No date: CARPAL TUNNEL RELEASE     Comment:  bilateral No date: CESAREAN SECTION  BMI    Body Mass Index:  44.33 kg/m      Reproductive/Obstetrics negative OB ROS                            Anesthesia Physical Anesthesia Plan  ASA: III  Anesthesia Plan: General LMA   Post-op Pain Management:    Induction:   PONV Risk Score and Plan: Ondansetron and Dexamethasone  Airway Management Planned:   Additional Equipment:   Intra-op Plan:   Post-operative Plan:   Informed Consent: I have reviewed the patients History and Physical, chart, labs and discussed the procedure including the risks, benefits and alternatives for the proposed anesthesia with the patient or authorized representative who has indicated his/her understanding and acceptance.    Dental Advisory Given  Plan Discussed with: Anesthesiologist, CRNA and Surgeon  Anesthesia Plan Comments:        Anesthesia Quick Evaluation

## 2017-10-21 NOTE — Anesthesia Postprocedure Evaluation (Signed)
Anesthesia Post Note  Patient: [name]  Procedure(s) Performed: CARPAL TUNNEL RELEASE (Right Arm Lower)  Patient location during evaluation: PACU Anesthesia Type: General Level of consciousness: awake and alert Pain management: pain level controlled Vital Signs Assessment: post-procedure vital signs reviewed and stable Respiratory status: spontaneous breathing, nonlabored ventilation and respiratory function stable Cardiovascular status: blood pressure returned to baseline and stable Postop Assessment: no apparent nausea or vomiting Anesthetic complications: no     Last Vitals:  Vitals:   10/20/17 1100 10/20/17 1125  BP: 133/82 (!) 139/92  Pulse: 91 98  Resp: (!) 24 16  Temp:  (!) 36.4 C  SpO2: 97% 97%    Last Pain:  Vitals:   10/20/17 1125  TempSrc: Temporal  PainSc: [place]

## 2017-12-27 ENCOUNTER — Other Ambulatory Visit: Payer: Self-pay | Admitting: Family Medicine

## 2017-12-27 ENCOUNTER — Ambulatory Visit
Admission: RE | Admit: 2017-12-27 | Discharge: 2017-12-27 | Disposition: A | Discharge status: Home / Self Care | Payer: Medicare HMO | Source: Ambulatory Visit | Attending: Family Medicine | Admitting: Family Medicine

## 2017-12-27 DIAGNOSIS — M19041 Primary osteoarthritis, right hand: Secondary | ICD-10-CM | POA: Insufficient documentation

## 2017-12-27 DIAGNOSIS — M79641 Pain in right hand: Secondary | ICD-10-CM

## 2017-12-27 DIAGNOSIS — M79642 Pain in left hand: Secondary | ICD-10-CM | POA: Insufficient documentation

## 2017-12-27 DIAGNOSIS — M19042 Primary osteoarthritis, left hand: Secondary | ICD-10-CM | POA: Insufficient documentation

## 2017-12-27 IMAGING — CR DG HAND COMPLETE 3+V*L*
3 series · 3 of 3 positions shown · non-contrast
Comparison: No prior.

CLINICAL DATA: History of carpal tunnel surgery.  Pain.

EXAM:
LEFT HAND - COMPLETE 3+ VIEW

[hand ap]
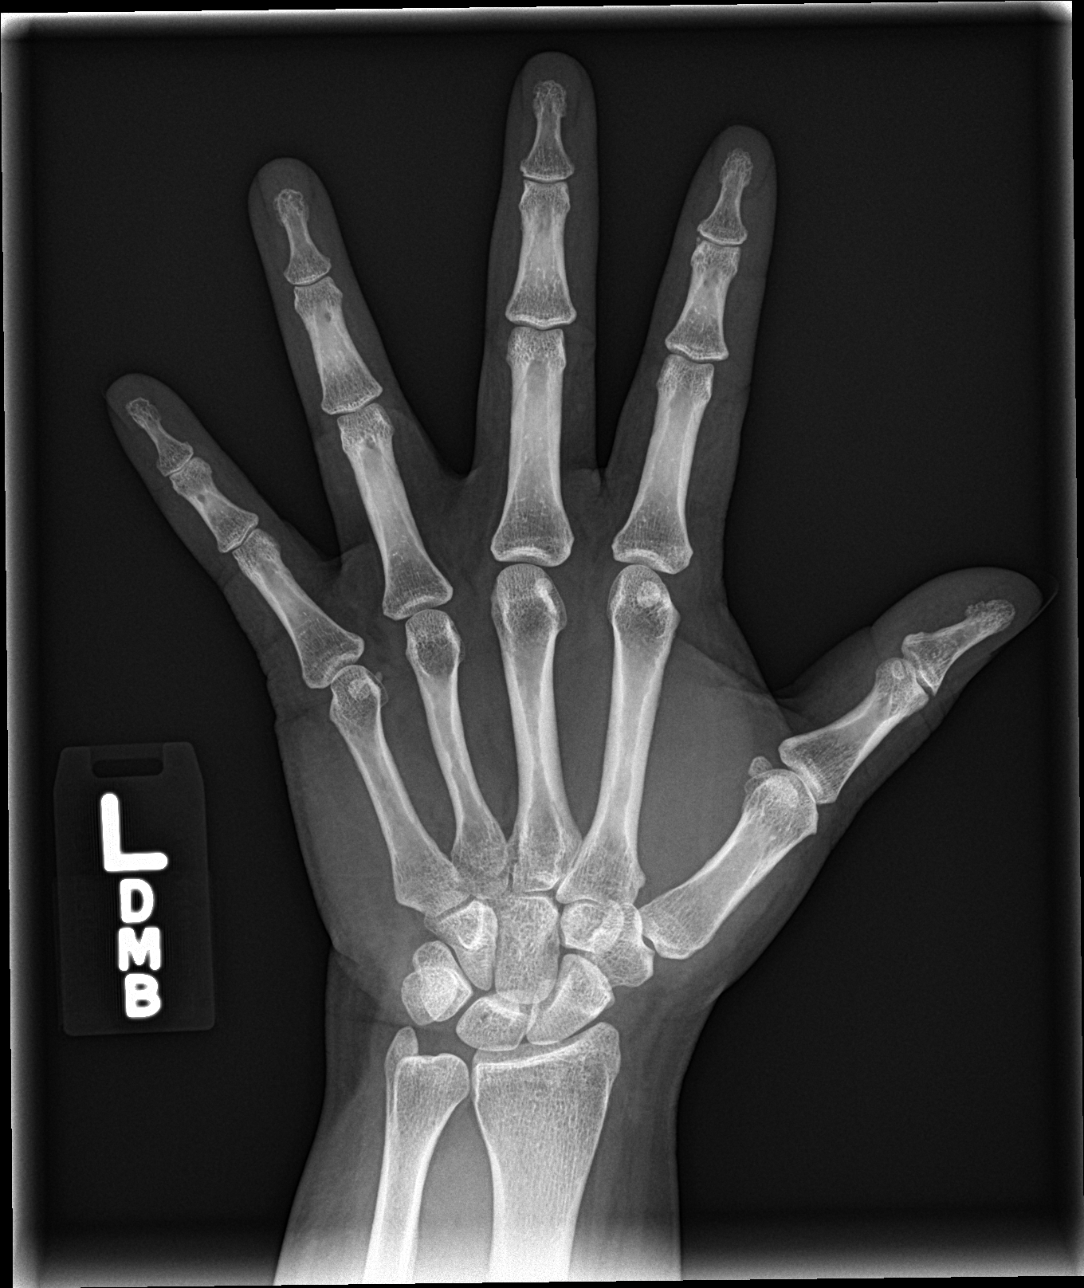

[hand obl]
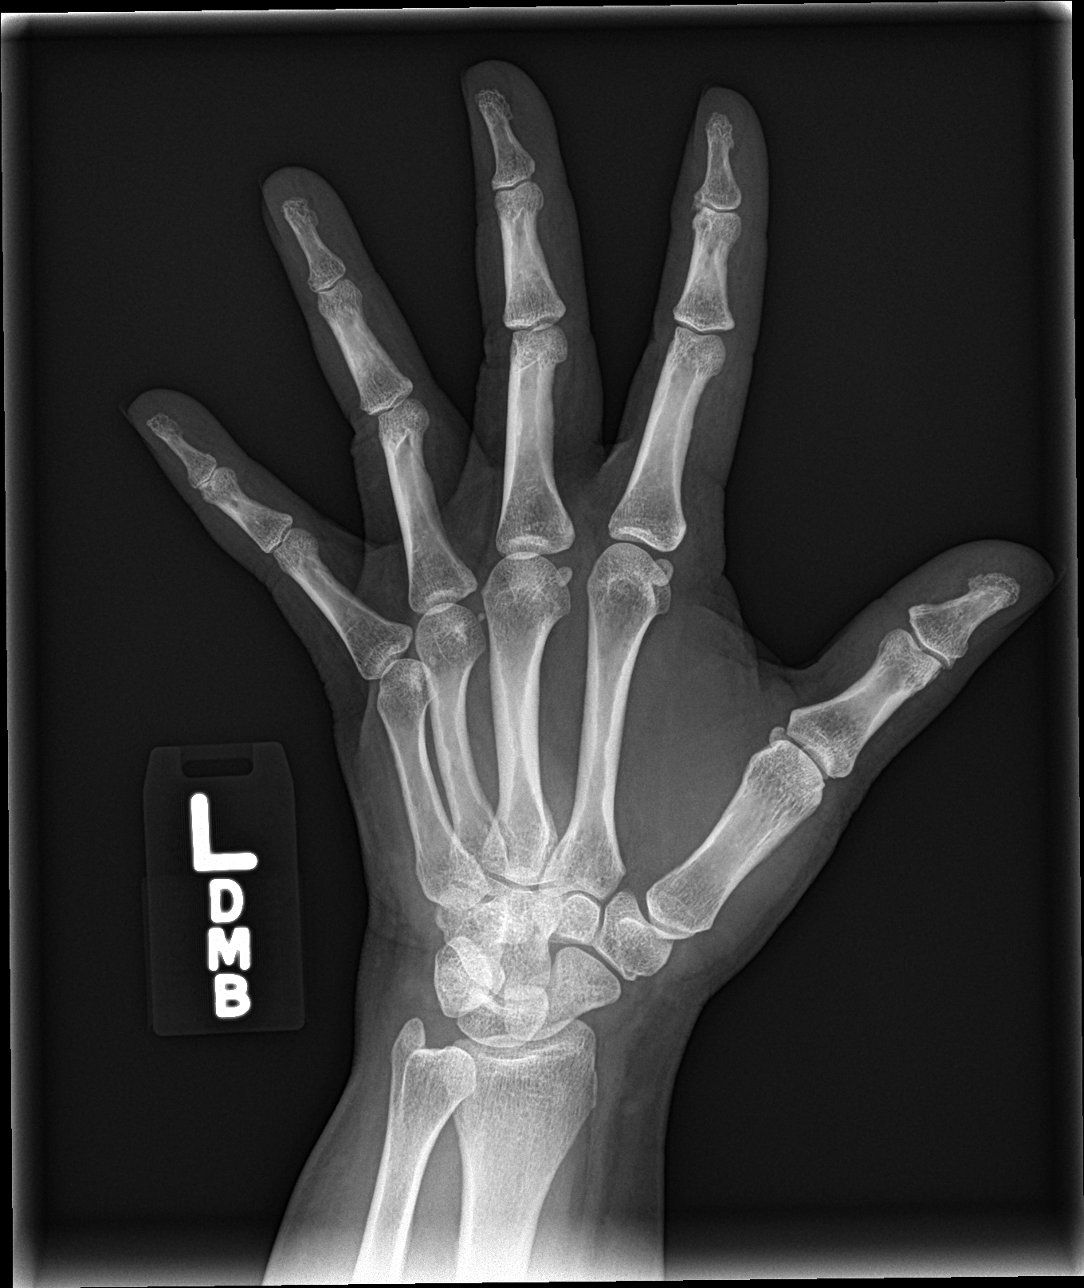

[hand lat]
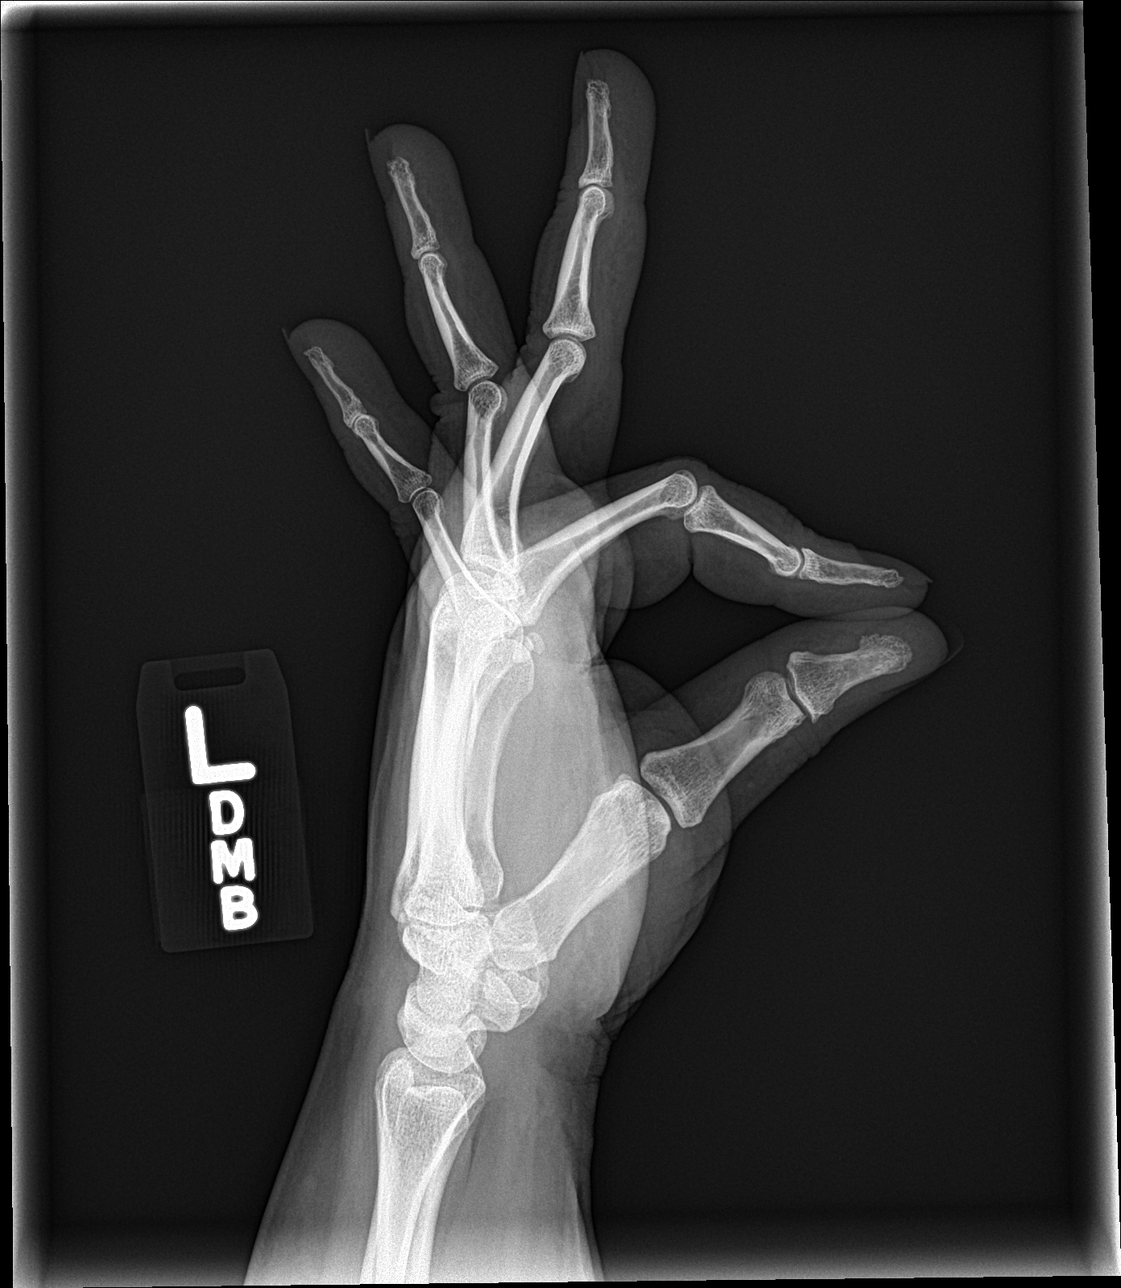

[3 of 3 positions shown; findings below may reference images not displayed]

FINDINGS: Diffuse mild degenerative change. Small carpal cysts are noted.
These most likely degenerative. [HOSPITAL] induced arthropathy such as
CPPD cannot be excluded. No acute abnormality no evidence of
fracture or dislocation.
IMPRESSION: Diffuse mild degenerative change.  No acute abnormality

## 2017-12-27 IMAGING — CR DG HAND COMPLETE 3+V*R*
3 series · 3 of 3 positions shown · non-contrast
Comparison: No prior.

CLINICAL DATA: History of prior surgery.  Pain.

EXAM:
RIGHT HAND - COMPLETE 3+ VIEW

[hand ap]
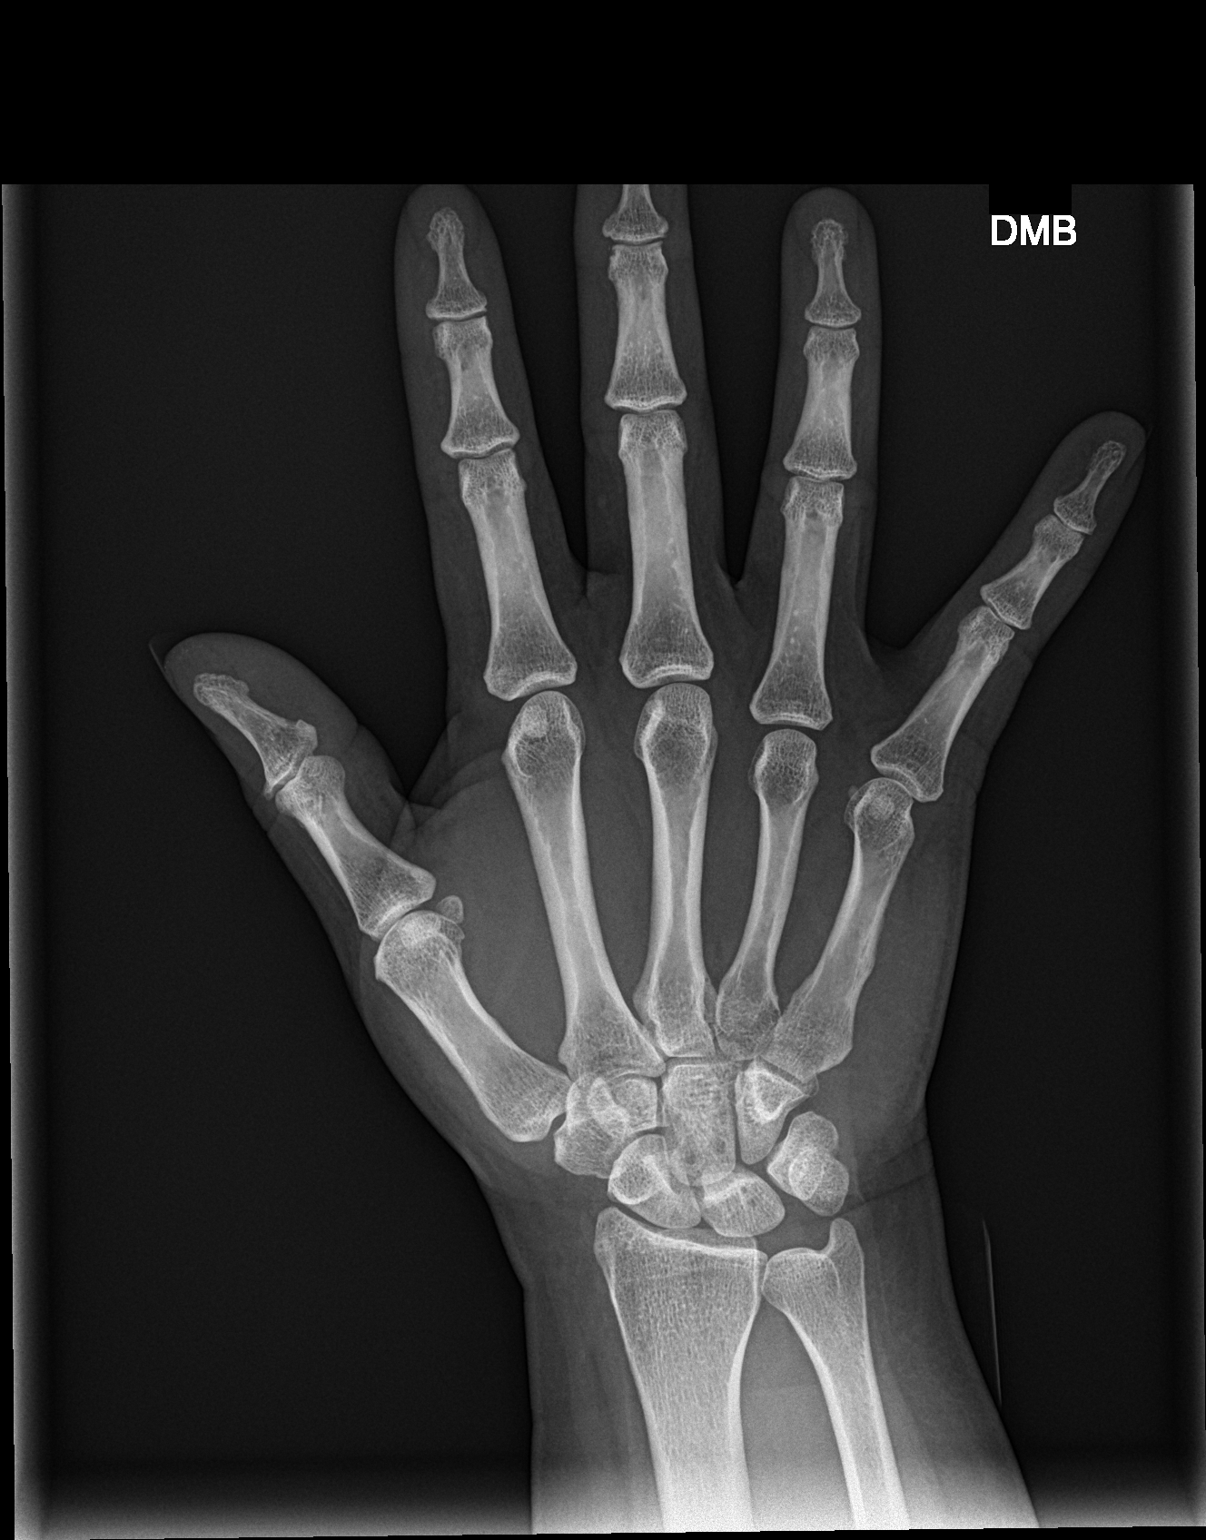

[hand obl]
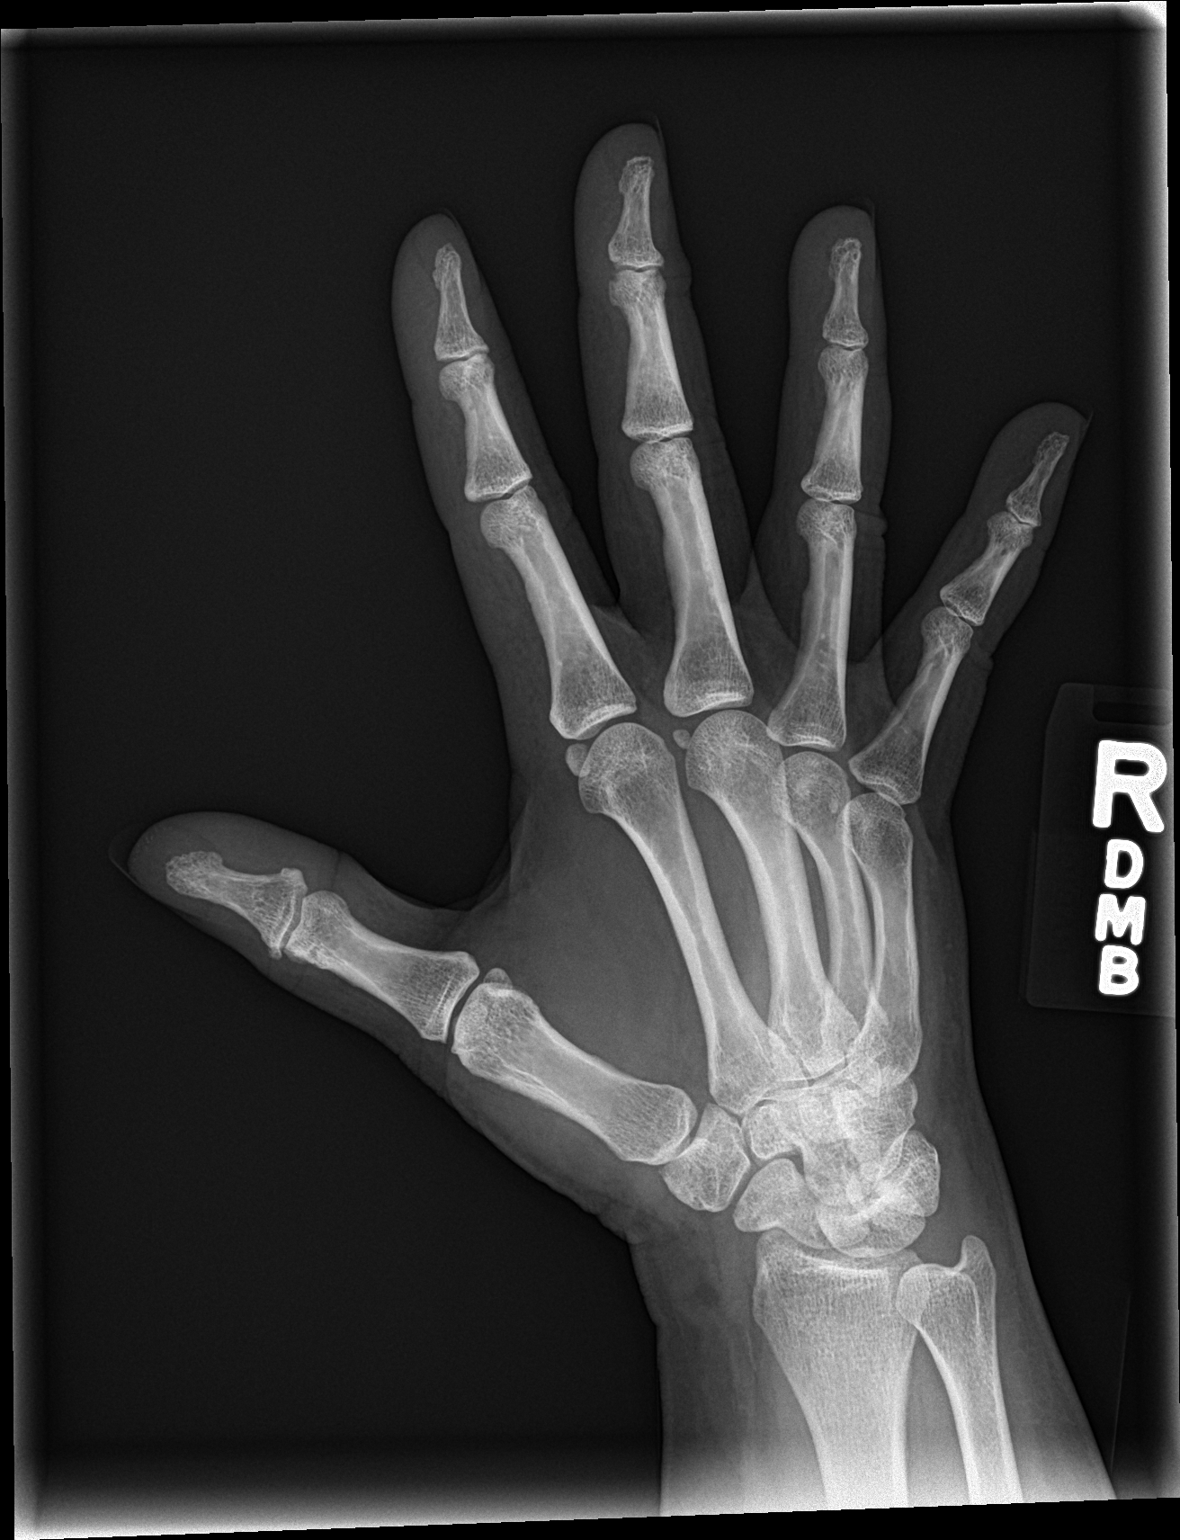

[hand lat]
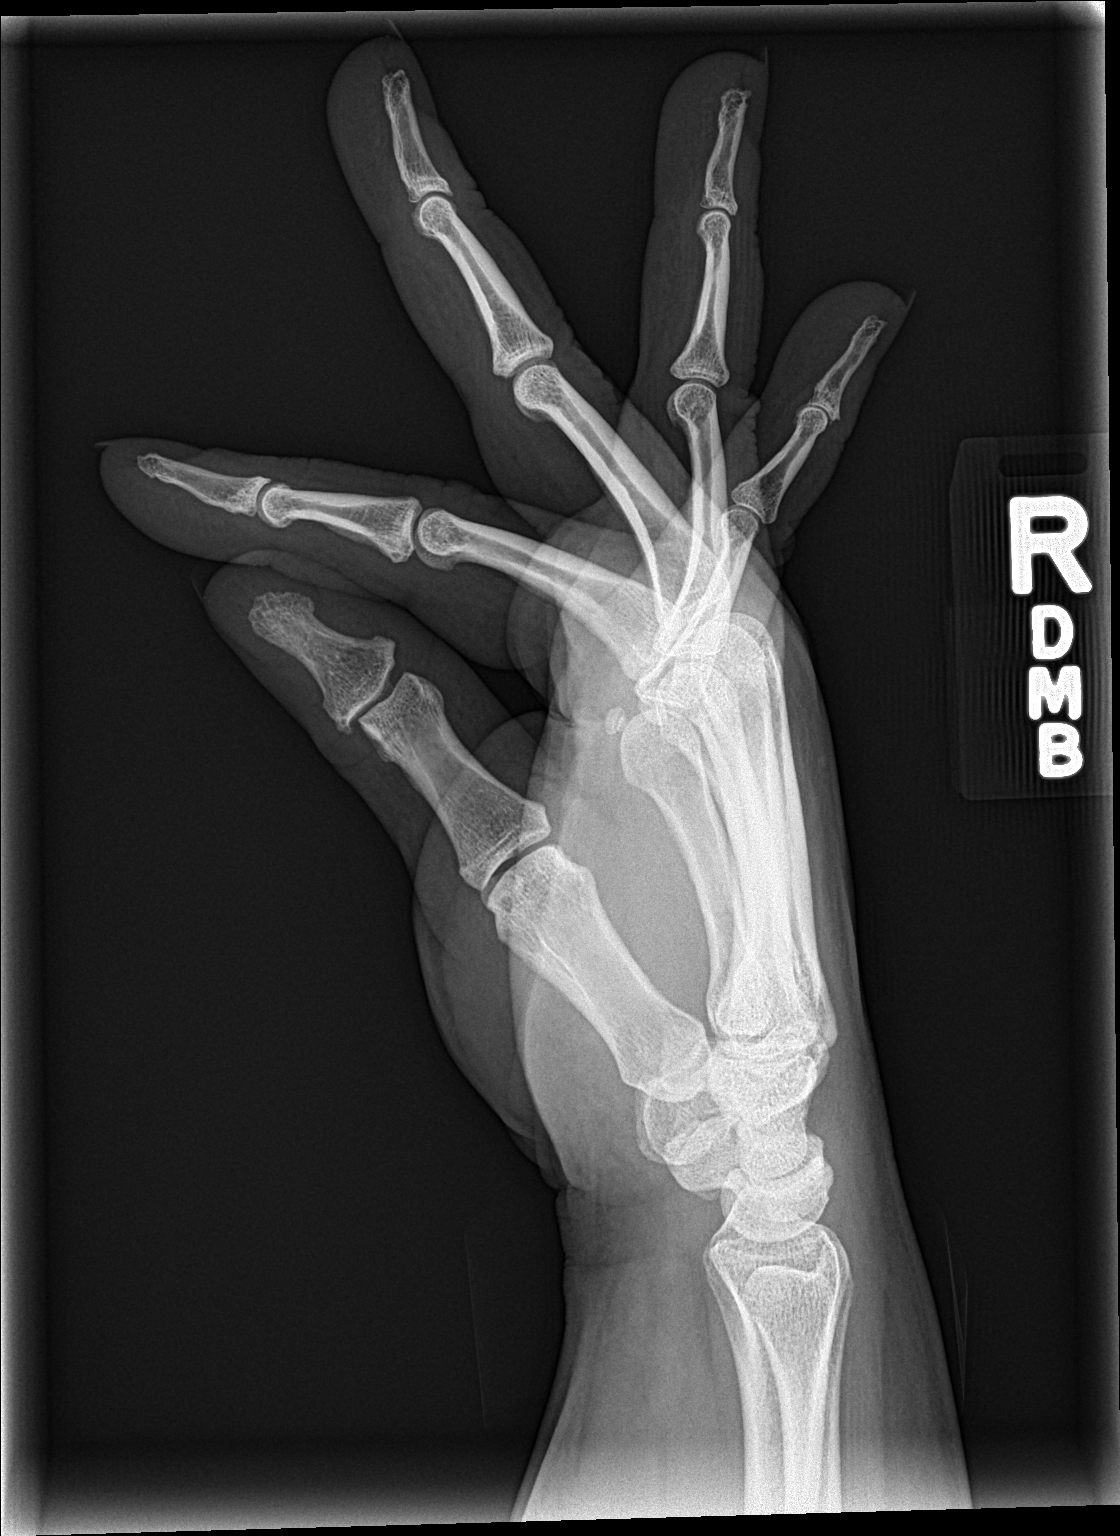

[3 of 3 positions shown; findings below may reference images not displayed]

FINDINGS: Diffuse degenerative change noted. Small carpal cysts are noted.
These most likely degenerative. [HOSPITAL] induced arthropathy
including CPPD cannot be excluded. No acute abnormality. No evidence
of fracture or dislocation.
IMPRESSION: Diffuse degenerative change.  No acute abnormality.

## 2018-05-12 ENCOUNTER — Other Ambulatory Visit: Payer: Self-pay | Admitting: Orthopedic Surgery

## 2018-05-12 DIAGNOSIS — M542 Cervicalgia: Secondary | ICD-10-CM

## 2018-05-12 DIAGNOSIS — M5412 Radiculopathy, cervical region: Secondary | ICD-10-CM

## 2018-06-29 ENCOUNTER — Other Ambulatory Visit: Payer: Self-pay

## 2018-06-29 ENCOUNTER — Ambulatory Visit
Admission: EM | Admit: 2018-06-29 | Discharge: 2018-06-29 | Discharge status: Against Medical Advice | Payer: Medicare HMO | Attending: Emergency Medicine | Admitting: Emergency Medicine

## 2018-06-29 DIAGNOSIS — R0602 Shortness of breath: Secondary | ICD-10-CM | POA: Diagnosis not present

## 2018-06-29 DIAGNOSIS — R600 Localized edema: Secondary | ICD-10-CM | POA: Insufficient documentation

## 2018-06-29 NOTE — ED Triage Notes (Addendum)
Patient states that she has been having shortness of breath and chest tightness and throat tightness. Patient states that she was sent here by primary MD. Patient states that she has had shortness of breath x 1 week. Patient states that she has gained 15 pounds in the last 3 days.  Interpreter Tiffany [phone]

## 2018-06-29 NOTE — ED Provider Notes (Addendum)
[facility] ____________________________________________  Time seen: Approximately 5:27 PM  I have reviewed the triage vital signs and the nursing notes.   HISTORY  Chief Complaint Shortness of Breath and Leg Swelling  Spanish interpreter utilized.  HPI [name] is a 48 y.o. female presenting for evaluation of shortness of breath and swelling sensation.  Patient reports over the last 1 week she has been having shortness of breath and what she describes as having to force deep breaths and breathing more rapidly.  Patient states over the last several days she has been noticing increased swelling to her bilateral hands and bilateral lower extremities.  Further states she has gained 15 pounds in the last 3 days.  History of hypertension.  Denies other cardiac history.  No injury or trauma.  Denies immobilization or recent sickness.  States she overall feels bloated and tight, but denies other pain.  Some tightness in chest intermittently, but denies other chest pain.  No history of similar.  Denies aggravating or alleviating factors.  Denies difficulty swallowing.  Denies recent cough, congestion or fevers.  Hortencia Pilar, MD: PCP   Past Medical History:  Diagnosis Date  . Anxiety   . Asthma   . Carpal tunnel syndrome   . Depression   . Disc degeneration, lumbosacral   . Hypertension   . Migraine   . Thyroid disease     There are no active problems to display for this patient.   Past Surgical History:  Procedure Laterality Date  . CARPAL TUNNEL RELEASE     bilateral  . CARPAL TUNNEL RELEASE Right 10/20/2017   Procedure: CARPAL TUNNEL RELEASE;  Surgeon: Leanor Kail, MD;  Location: [facility] ORS;  Service: Orthopedics;  Laterality: Right;  . CESAREAN SECTION       No current facility-administered medications for this encounter.   Current Outpatient Medications:  .  albuterol (PROVENTIL HFA;VENTOLIN HFA) 108 (90 Base) MCG/ACT inhaler, Inhale 2 puffs  into the lungs every 8 (eight) hours as needed for wheezing or shortness of breath., Disp: , Rfl:  .  carvedilol (COREG) 6.25 MG tablet, Take 6.25 mg by mouth 2 (two) times daily with a meal., Disp: , Rfl:  .  clonazePAM (KLONOPIN) 1 MG tablet, Take 1 tablet by mouth daily as needed for anxiety., Disp: , Rfl: 1 .  hydrochlorothiazide (MICROZIDE) 12.5 MG capsule, Take 12.5 mg by mouth daily., Disp: , Rfl:  .  levothyroxine (SYNTHROID, LEVOTHROID) 100 MCG tablet, Take 1 tablet (100 mcg total) by mouth daily before breakfast., Disp: 30 tablet, Rfl: 3 .  venlafaxine XR (EFFEXOR-XR) 150 MG 24 hr capsule, Take 1 capsule by mouth daily., Disp: , Rfl: 4 .  benzonatate (TESSALON) 200 MG capsule, Take one cap TID PRN cough, Disp: 30 capsule, Rfl: 0 .  chlorpheniramine-HYDROcodone (TUSSIONEX PENNKINETIC ER) 10-8 MG/5ML SUER, Take 5 mLs by mouth every 12 (twelve) hours as needed., Disp: 115 mL, Rfl: 0 .  gabapentin (NEURONTIN) 400 MG capsule, Take 400 mg by mouth every morning., Disp: , Rfl:  .  predniSONE (DELTASONE) 50 MG tablet, 1 tablet daily x 5 days., Disp: 5 tablet, Rfl: 0  Allergies Morphine and related  Family History  Problem Relation Age of Onset  . Hypertension Mother   . Depression Mother   . Heart disease Mother   . Thyroid disease Mother   . Heart disease Father   . Heart attack Father   . Cancer Paternal Aunt   . Cancer Paternal Uncle   .  Breast cancer Neg Hx     Social History Social History   Tobacco Use  . Smoking status: Never Smoker  . Smokeless tobacco: Never Used  Substance Use Topics  . Alcohol use: Yes    Comment: rarely  . Drug use: No    Review of Systems Constitutional: No fever ENT: No sore throat. Cardiovascular: Denies chest pain. Respiratory: Positive shortness of breath. Gastrointestinal: Positive abdominal bloated sensation.  Denies abdominal pain.  No nausea, no vomiting.  Positive intermittent diarrhea.  No constipation.  Last bowel movement today.  Genitourinary: Negative for dysuria. Musculoskeletal: Negative for back pain. Skin: Negative for rash. Neurological: Negative for focal weakness or numbness.   ____________________________________________   PHYSICAL EXAM:  VITAL SIGNS: ED Triage Vitals [06/29/18 1658]  Enc Vitals Group     BP (!) 143/77     Pulse Rate (!) 101     Resp 20     Temp 98.8 F (37.1 C)     Temp Source Oral     SpO2 98 %     Weight 270 lb (122.5 kg)     Height 5\' 1"  (1.549 m)     Head Circumference      Peak Flow      Pain Score 7     Pain Loc      Pain Edu?      Excl. in [place]?     Constitutional: Alert and oriented.  Patient tearful. ENT      Head: Normocephalic and atraumatic. Cardiovascular:  Grossly normal heart sounds.  Good peripheral circulation. Respiratory: Mild increased respiratory rate.  No wheezes, rales, rhonchi. Gastrointestinal: Soft and nontender.  No CVA tenderness. Musculoskeletal:  Bilateral pedal and distal radial pulses equal and easily palpated.  Ambulatory with steady gait.  Bilateral lower extremities mild pitting edema. Neurologic:  Normal speech and language. No gross focal neurologic deficits are appreciated. Speech is normal. No gait instability.  Skin:  Skin is warm, dry and intact. No rash noted. Psychiatric: Mood and affect are normal. Speech and behavior are normal. Patient exhibits appropriate insight and judgment   ___________________________________________   LABS (all labs ordered are listed, but only abnormal results are displayed)  Labs Reviewed - No data to display ____________________________________________  EKG  ED ECG REPORT I, Marylene Land, the attending provider, personally viewed and interpreted this ECG.   Date: 06/29/2018  EKG Time: 1733  Rate: 99  Rhythm: normal sinus rhythm  Axis: normal  Intervals:none  ST&T Change: none noted  RADIOLOGY  No results found. ____________________________________________   PROCEDURES  Procedures     INITIAL IMPRESSION / ASSESSMENT AND PLAN / ED COURSE  Pertinent labs & imaging results that were available during my care of the patient were reviewed by me and considered in my medical decision making (see chart for details).  Patient appears anxious, denies anxiety.  Patient tearful.  Patient complaining of shortness of breath with edema present over the last 1 week.  Recommend further evaluation emergency room for further evaluation.  Discussed transfer by EMS.  Patient alert and oriented with decisional capacity and declines EMS transfer and verbalizes understand risk including up to death, and states her friend will drive her to [facility].  Patient directed to go directly to the emergency room.  Patient agrees to this plan.   ____________________________________________   FINAL CLINICAL IMPRESSION(S) / ED DIAGNOSES  Final diagnoses:  Shortness of breath  Leg edema     ED Discharge Orders  None       Note: This dictation was prepared with Dragon dictation along with smaller phrase technology. Any transcriptional errors that result from this process are unintentional.        Marylene Land, NP 06/29/18 1747

## 2018-06-29 NOTE — Discharge Instructions (Signed)
Go directly to emergency room.  

## 2018-07-02 MED ORDER — VENLAFAXINE HCL ER 75 MG PO CP24
150.00 | ORAL_CAPSULE | ORAL | Status: DC
Start: 2018-07-01 — End: 2018-07-02

## 2018-07-02 MED ORDER — ALBUTEROL SULFATE (2.5 MG/3ML) 0.083% IN NEBU
1.25 | INHALATION_SOLUTION | RESPIRATORY_TRACT | Status: DC
Start: ? — End: 2018-07-02

## 2018-07-02 MED ORDER — ASPIRIN 81 MG PO CHEW
81.00 | CHEWABLE_TABLET | ORAL | Status: DC
Start: 2018-07-01 — End: 2018-07-02

## 2018-07-02 MED ORDER — MONTELUKAST SODIUM 10 MG PO TABS
10.00 | ORAL_TABLET | ORAL | Status: DC
Start: 2018-06-30 — End: 2018-07-02

## 2018-07-02 MED ORDER — TIZANIDINE HCL 4 MG PO TABS
4.00 | ORAL_TABLET | ORAL | Status: DC
Start: ? — End: 2018-07-02

## 2018-07-02 MED ORDER — IPRATROPIUM BROMIDE 0.02 % IN SOLN
500.00 | RESPIRATORY_TRACT | Status: DC
Start: 2018-06-30 — End: 2018-07-02

## 2018-07-02 MED ORDER — LEVOTHYROXINE SODIUM 112 MCG PO TABS
112.00 | ORAL_TABLET | ORAL | Status: DC
Start: 2018-07-01 — End: 2018-07-02

## 2018-07-02 MED ORDER — MELOXICAM 15 MG PO TABS
15.00 | ORAL_TABLET | ORAL | Status: DC
Start: ? — End: 2018-07-02

## 2018-07-02 MED ORDER — GENERIC EXTERNAL MEDICATION
Status: DC
Start: ? — End: 2018-07-02

## 2018-07-02 MED ORDER — FLUTICASONE FUROATE-VILANTEROL 200-25 MCG/INH IN AEPB
1.00 | INHALATION_SPRAY | RESPIRATORY_TRACT | Status: DC
Start: 2018-06-30 — End: 2018-07-02

## 2018-07-02 MED ORDER — CLONAZEPAM 1 MG PO TABS
1.00 | ORAL_TABLET | ORAL | Status: DC
Start: ? — End: 2018-07-02

## 2018-07-02 MED ORDER — HYDROCHLOROTHIAZIDE 12.5 MG PO TABS
12.50 | ORAL_TABLET | ORAL | Status: DC
Start: 2018-07-01 — End: 2018-07-02

## 2018-07-02 MED ORDER — CARVEDILOL 6.25 MG PO TABS
6.25 | ORAL_TABLET | ORAL | Status: DC
Start: ? — End: 2018-07-02

## 2018-07-02 MED ORDER — ZOLPIDEM TARTRATE 5 MG PO TABS
5.00 | ORAL_TABLET | ORAL | Status: DC
Start: ? — End: 2018-07-02

## 2018-07-02 MED ORDER — FLUTICASONE PROPIONATE 50 MCG/ACT NA SUSP
2.00 | NASAL | Status: DC
Start: 2018-07-01 — End: 2018-07-02

## 2018-07-02 MED ORDER — LORATADINE 10 MG PO TABS
10.00 | ORAL_TABLET | ORAL | Status: DC
Start: 2018-07-01 — End: 2018-07-02

## 2018-07-02 MED ORDER — POLYETHYLENE GLYCOL 3350 17 G PO PACK
17.00 | PACK | ORAL | Status: DC
Start: 2018-07-01 — End: 2018-07-02

## 2018-07-02 MED ORDER — ACETAMINOPHEN 325 MG PO TABS
650.00 | ORAL_TABLET | ORAL | Status: DC
Start: ? — End: 2018-07-02

## 2018-07-07 ENCOUNTER — Other Ambulatory Visit: Payer: Self-pay

## 2018-07-07 ENCOUNTER — Ambulatory Visit
Admission: RE | Admit: 2018-07-07 | Discharge: 2018-07-07 | Disposition: A | Discharge status: Home / Self Care | Payer: Medicare HMO | Source: Ambulatory Visit | Attending: Orthopedic Surgery | Admitting: Orthopedic Surgery

## 2018-07-07 DIAGNOSIS — M542 Cervicalgia: Secondary | ICD-10-CM | POA: Insufficient documentation

## 2018-07-07 DIAGNOSIS — M5412 Radiculopathy, cervical region: Secondary | ICD-10-CM | POA: Insufficient documentation

## 2018-07-07 IMAGING — MR MRI CERVICAL SPINE WITHOUT CONTRAST
5 series · 40 of 48 positions shown · non-contrast
Comparison: None.

CLINICAL DATA: 47-year-old female with pain radiating to the
bilateral shoulders and arms right greater than left. Tingling in
the right 4th and 5th fingers since [DATE].

EXAM:
MRI CERVICAL SPINE WITHOUT CONTRAST
TECHNIQUE: Multiplanar, multisequence MR imaging of the cervical spine was
performed. No intravenous contrast was administered.

[Series 5: T2 · sagittal · 3.0mm · 0.62mm/px · 8 of 15 slices shown (1 of 2)]
[im 1/15]
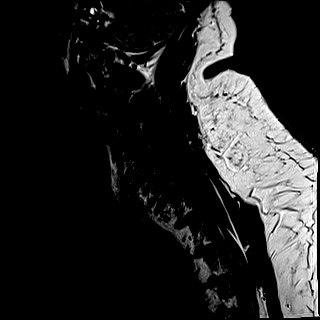
[im 3/15]
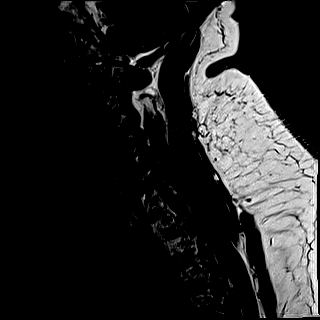
[im 5/15]
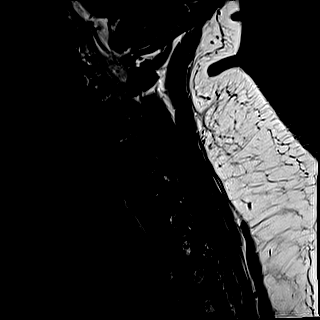
[im 7/15]
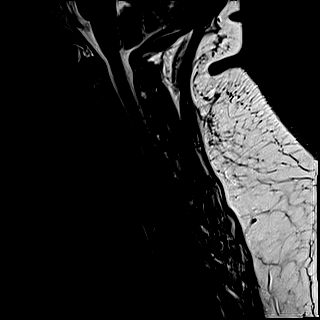
[im 9/15]
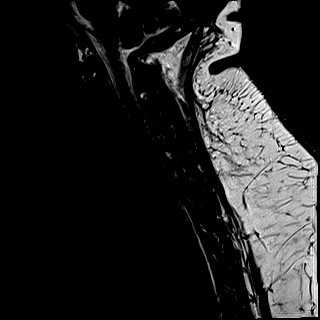
[im 11/15]
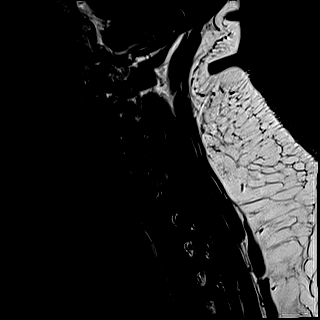
[im 13/15]
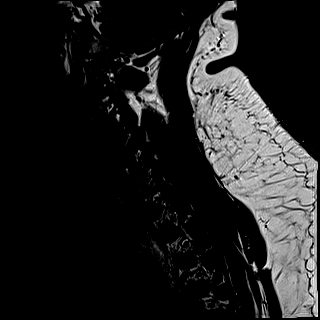
[im 15/15]
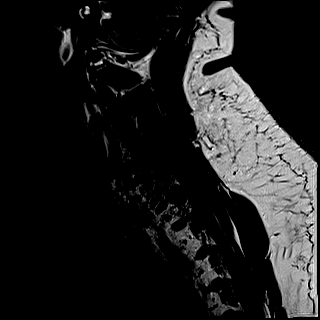

[Series 6: FLAIR · sagittal · 3.0mm · 0.78mm/px · 8 of 15 slices shown]
[im 1/15]
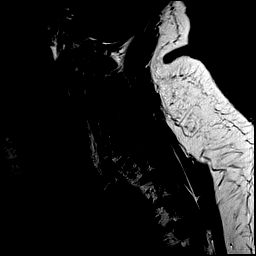
[im 3/15]
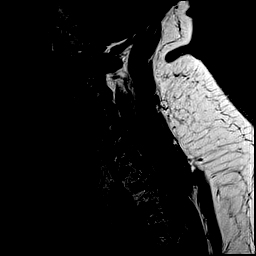
[im 5/15]
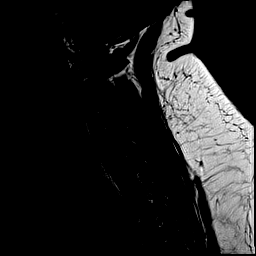
[im 7/15]
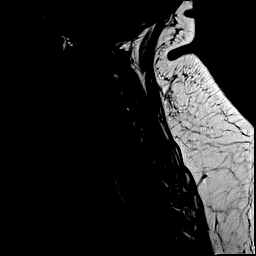
[im 9/15]
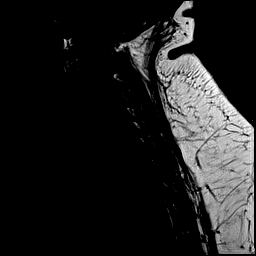
[im 11/15]
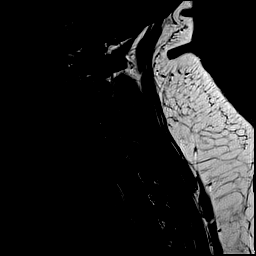
[im 13/15]
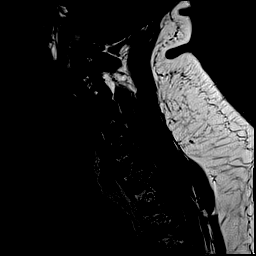
[im 15/15]
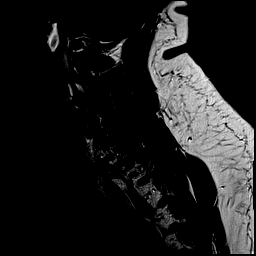

[Series 7: STIR · sagittal · 3.0mm · 0.62mm/px · 8 of 15 slices shown]
[im 1/15]
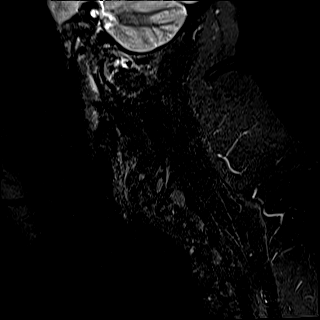
[im 3/15]
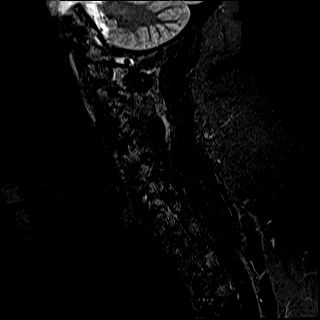
[im 5/15]
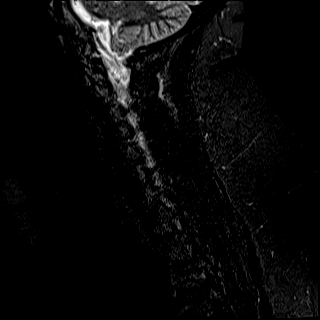
[im 7/15]
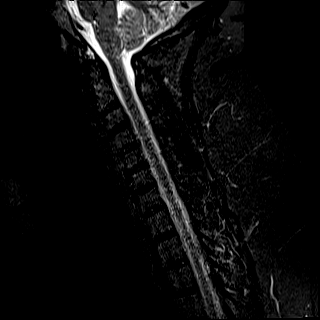
[im 9/15]
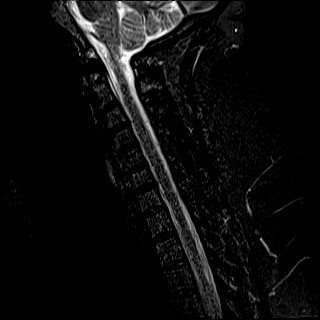
[im 11/15]
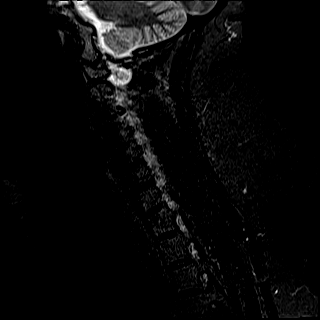
[im 13/15]
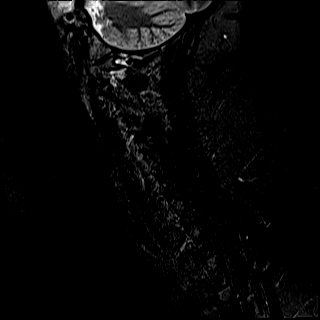
[im 15/15]
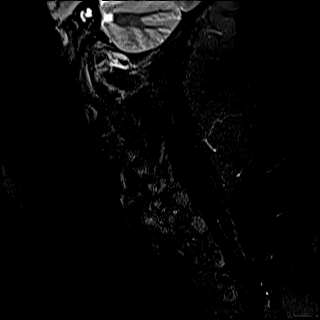

[Series 8: T2 · axial · 3.0mm · 0.70mm/px · z∈[-23,+52]mm · 9 of 24 slices shown (2 of 2)]
[im 1/24]
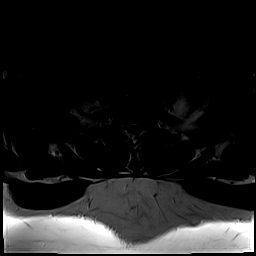
[im 5/24]
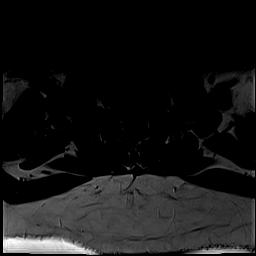
[im 7/24]
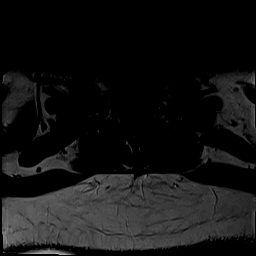
[im 11/24]
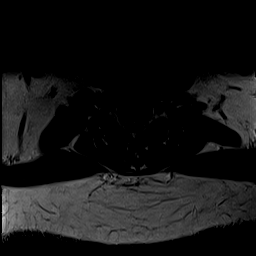
[im 13/24]
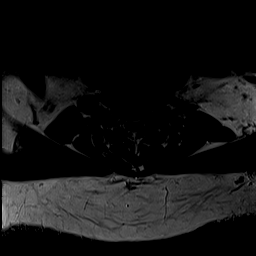
[im 17/24]
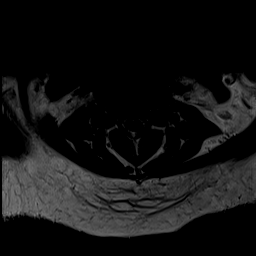
[im 19/24]
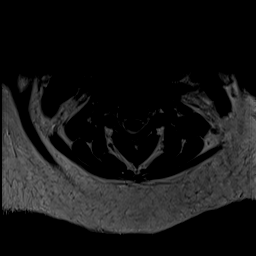
[im 21/24]
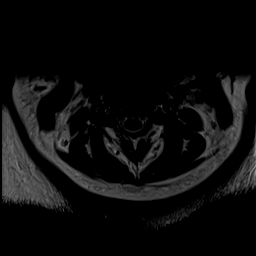
[im 24/24]
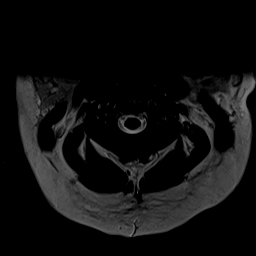

[Series 9: ax mpgr · axial · 3.0mm · 0.35mm/px · z∈[-23,+36]mm · 7 of 24 slices shown]
[im 1/24]
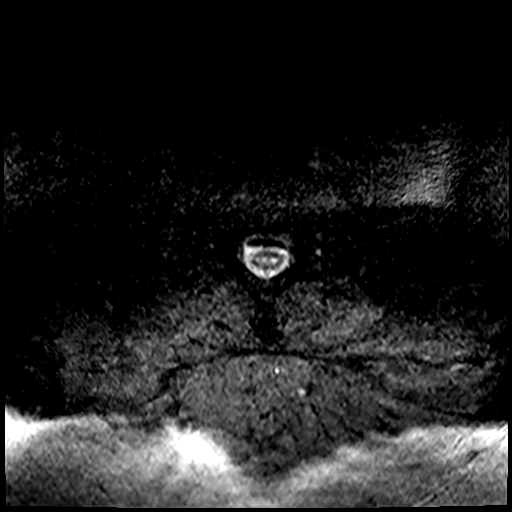
[im 5/24]
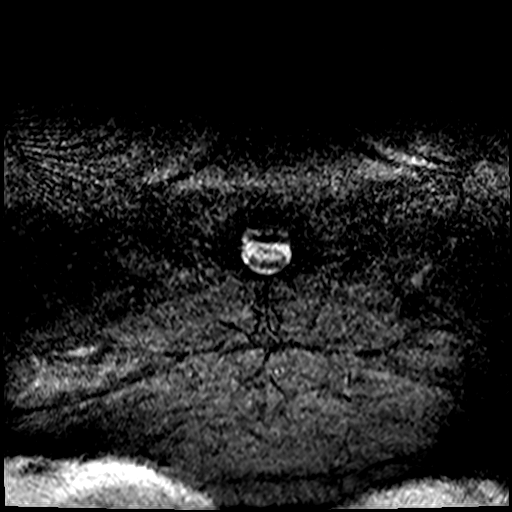
[im 7/24]
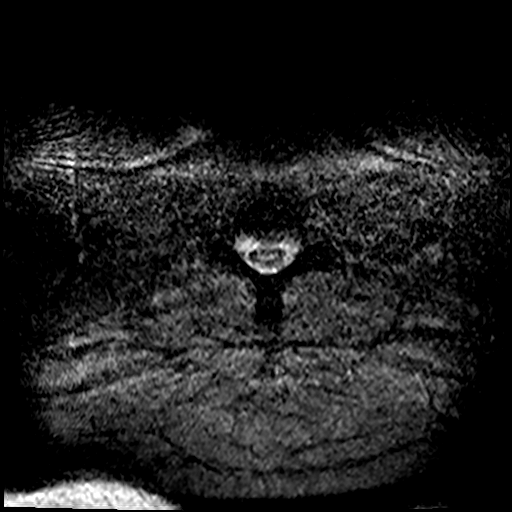
[im 11/24]
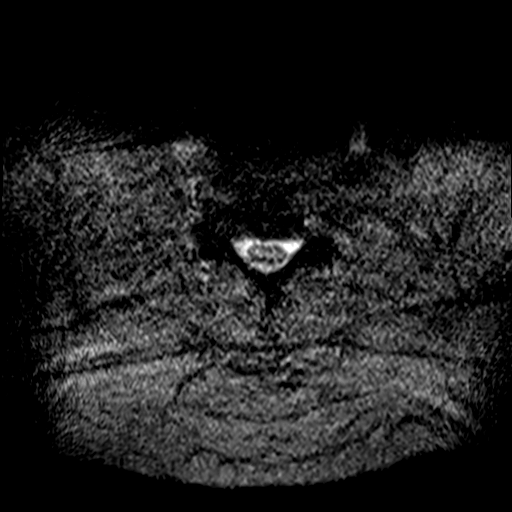
[im 13/24]
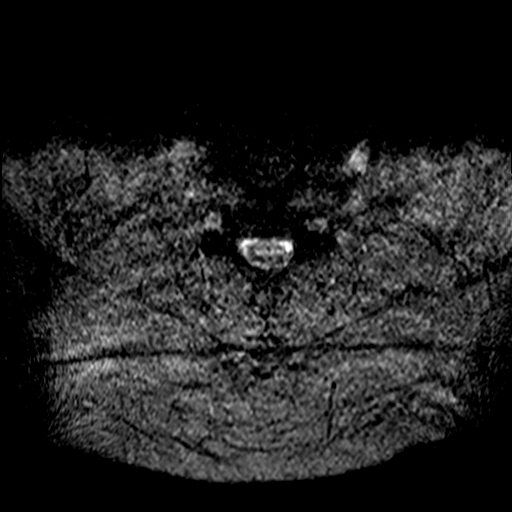
[im 17/24]
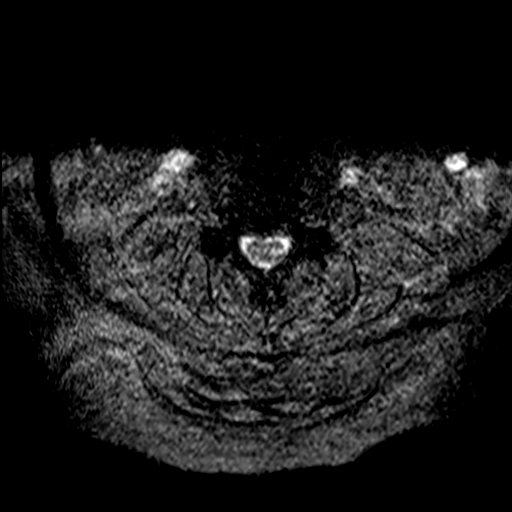
[im 19/24]
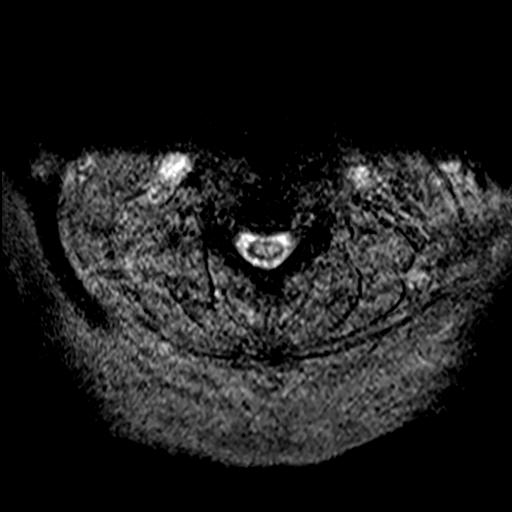

[40 of 48 positions shown; findings below may reference images not displayed]

FINDINGS: Alignment: Straightening of cervical lordosis. No spondylolisthesis.

Vertebrae: No marrow edema or evidence of acute osseous abnormality.
Visualized bone marrow signal is within normal limits.

Cord: Spinal cord signal is within normal limits at all visualized
levels.

Posterior Fossa, vertebral arteries, paraspinal tissues:
Cervicomedullary junction is within normal limits. Negative visible
posterior fossa.

Preserved major vascular flow voids in the neck. Negative visible
neck soft tissues and lung apices.

Disc levels:

C2-C3:  Mild left facet hypertrophy.  No stenosis.

C3-C4: Minor disc bulge. Mild left facet hypertrophy. No stenosis.

C4-C5:  Negative.

C5-C6:  Minimal disc bulge.  No stenosis.

C6-C7: Suggestion of a tiny right paracentral disc protrusion on
series 8, image 18. Underlying mild disc bulging and endplate
spurring. Borderline to mild spinal stenosis. No convincing
foraminal stenosis.

C7-T1: Mild facet hypertrophy. Mild endplate spurring. No spinal
stenosis. Mild right C8 foraminal stenosis.

Negative visible upper thoracic levels.
IMPRESSION: No acute osseous abnormality and overall mild cervical spine
degeneration.

Possible small right paracentral disc herniation at C6-C7 with
borderline to mild spinal stenosis. There is mild right neural
foraminal stenosis at C7-T1 related to endplate and facet spurring.
Query right C8 radiculitis.

## 2019-02-23 DIAGNOSIS — J4550 Severe persistent asthma, uncomplicated: Secondary | ICD-10-CM | POA: Insufficient documentation

## 2019-05-01 ENCOUNTER — Ambulatory Visit: Payer: Medicare HMO | Attending: Internal Medicine

## 2019-05-01 DIAGNOSIS — Z23 Encounter for immunization: Secondary | ICD-10-CM

## 2019-05-01 NOTE — Progress Notes (Signed)
   Covid-19 Vaccination Clinic  Name:  [name]    MRN: [id number] DOB: [birth year]  05/01/2019  [name] was observed post Covid-19 immunization for 30 minutes based on pre-vaccination screening without incident. She was provided with Vaccine Information Sheet and instruction to access the V-Safe system. Comploained of elevated HR. VSS 147/88 HR 97 SPO2 96 c/o feeling anxious. 1155 said felt better  [name] was instructed to call 911 with any severe reactions post vaccine: Marland Kitchen Difficulty breathing  . Swelling of face and throat  . A fast heartbeat  . A bad rash all over body  . Dizziness and weakness   Immunizations Administered    Name Date Dose VIS Date Route   Pfizer COVID-19 Vaccine 05/01/2019 11:34 AM 0.3 mL 01/13/2019 Intramuscular   Manufacturer: [place]   Lot: [id number]   [place]: [id number]

## 2019-05-22 ENCOUNTER — Ambulatory Visit: Payer: Medicare HMO | Attending: Internal Medicine

## 2019-05-22 DIAGNOSIS — Z23 Encounter for immunization: Secondary | ICD-10-CM

## 2019-05-22 NOTE — Progress Notes (Signed)
   Covid-19 Vaccination Clinic  Name:  [name]    MRN: [id number] DOB: Dec 16, 1970  05/22/2019  [name] was observed post Covid-19 immunization for 15 minutes without incident. She was provided with Vaccine Information Sheet and instruction to access the V-Safe system.   [name] was instructed to call 911 with any severe reactions post vaccine: Marland Kitchen Difficulty breathing  . Swelling of face and throat  . A fast heartbeat  . A bad rash all over body  . Dizziness and weakness   Immunizations Administered    Name Date Dose VIS Date Route   Pfizer COVID-19 Vaccine 05/22/2019 10:13 AM 0.3 mL 03/29/2018 Intramuscular   Manufacturer: [employer]   Lot: [id number]   [place]: [id number]

## 2019-05-25 ENCOUNTER — Telehealth: Payer: Self-pay | Admitting: Emergency Medicine

## 2019-05-25 ENCOUNTER — Ambulatory Visit
Admission: EM | Admit: 2019-05-25 | Discharge: 2019-05-25 | Disposition: A | Discharge status: Home / Self Care | Payer: Medicare HMO | Attending: Family Medicine | Admitting: Family Medicine

## 2019-05-25 ENCOUNTER — Other Ambulatory Visit: Payer: Self-pay

## 2019-05-25 ENCOUNTER — Encounter: Payer: Self-pay | Admitting: Emergency Medicine

## 2019-05-25 ENCOUNTER — Ambulatory Visit (INDEPENDENT_AMBULATORY_CARE_PROVIDER_SITE_OTHER): Payer: Medicare HMO

## 2019-05-25 DIAGNOSIS — R109 Unspecified abdominal pain: Secondary | ICD-10-CM

## 2019-05-25 LAB — URINALYSIS, COMPLETE (UACMP) WITH MICROSCOPIC
Bilirubin Urine: NEGATIVE
Glucose, UA: NEGATIVE mg/dL
Ketones, ur: NEGATIVE mg/dL
Leukocytes,Ua: NEGATIVE
Nitrite: NEGATIVE
Protein, ur: NEGATIVE mg/dL
Specific Gravity, Urine: 1.015 (ref 1.005–1.030)
pH: 6 (ref 5.0–8.0)

## 2019-05-25 LAB — PREGNANCY, URINE: Preg Test, Ur: NEGATIVE

## 2019-05-25 IMAGING — CT CT RENAL STONE PROTOCOL
1 of 2 series · 15 of 32 positions shown, 19 images · non-contrast
Comparison: None.

CLINICAL DATA: Acute left flank pain.

EXAM:
CT ABDOMEN AND PELVIS WITHOUT CONTRAST
TECHNIQUE: Multidetector CT imaging of the abdomen and pelvis was performed
following the standard protocol without IV contrast.

[Series 2: axial st · axial · 0.84mm/px · z∈[-1254,-794]mm · 15 of 100 slices shown, 19 images]
[im 4/100  soft-tissue]
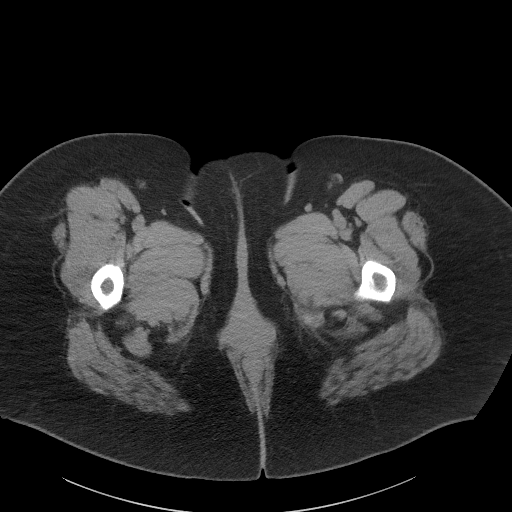
[im 4/100  bone]
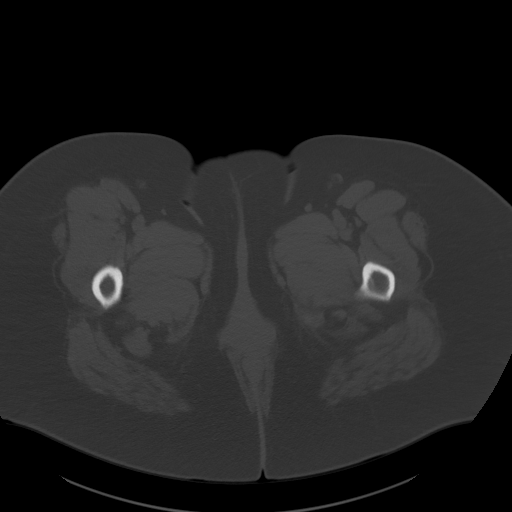
[im 12/100  soft-tissue]
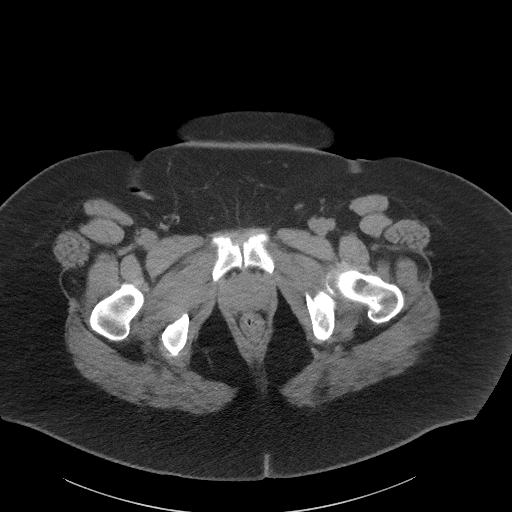
[im 20/100  soft-tissue]
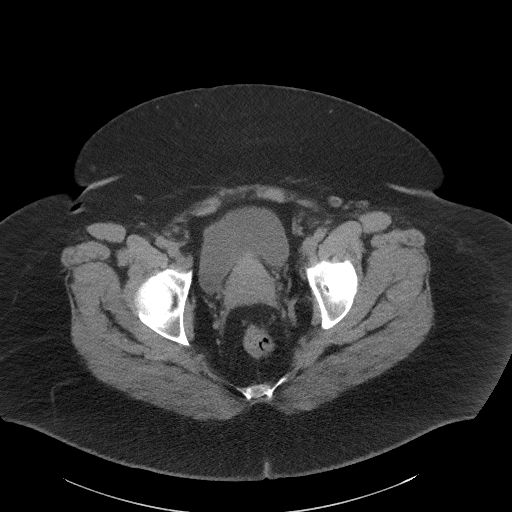
[im 28/100  soft-tissue]
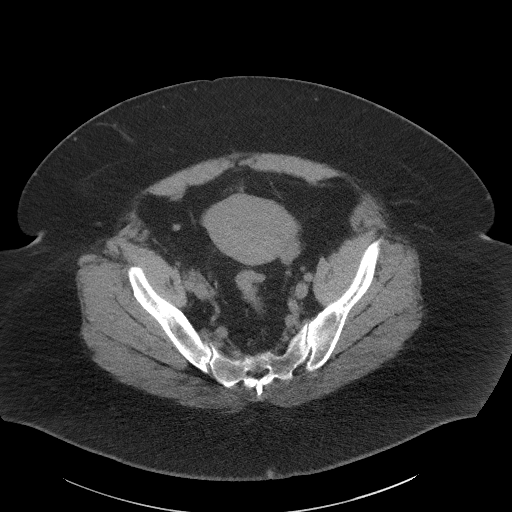
[im 36/100  soft-tissue]
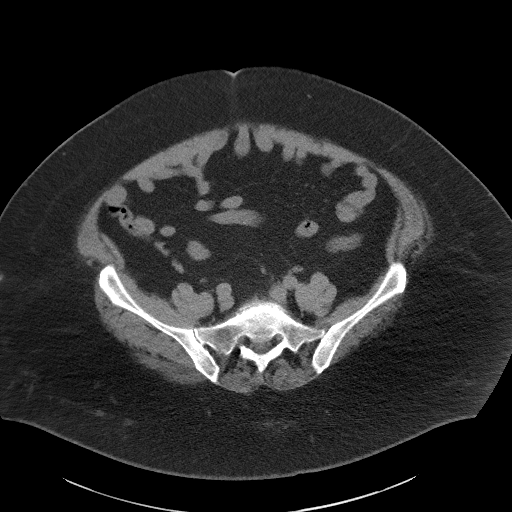
[im 44/100  soft-tissue]
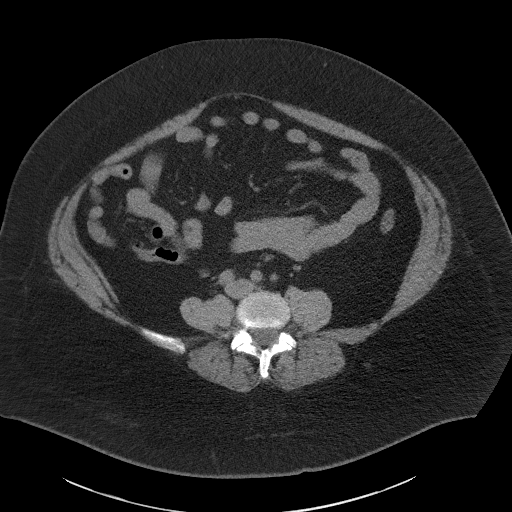
[im 52/100  soft-tissue]
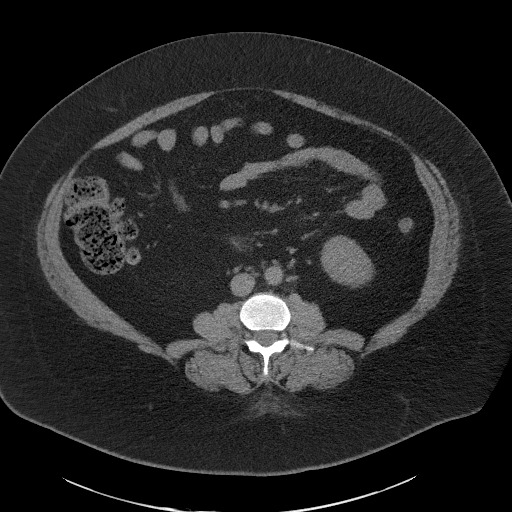
[im 56/100  soft-tissue]
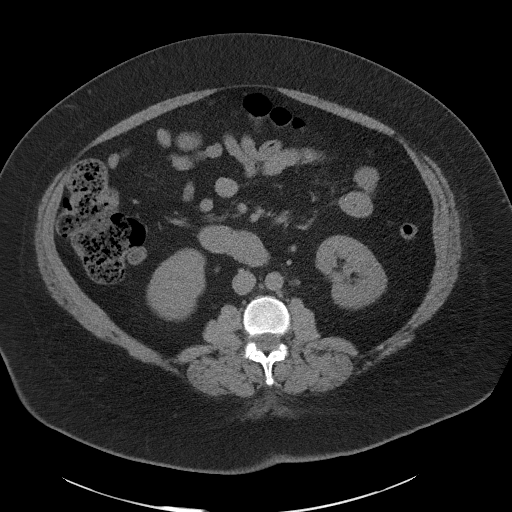
[im 64/100  soft-tissue]
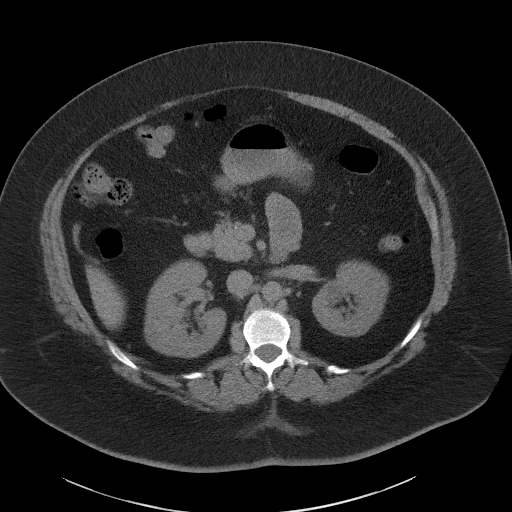
[im 64/100  bone]
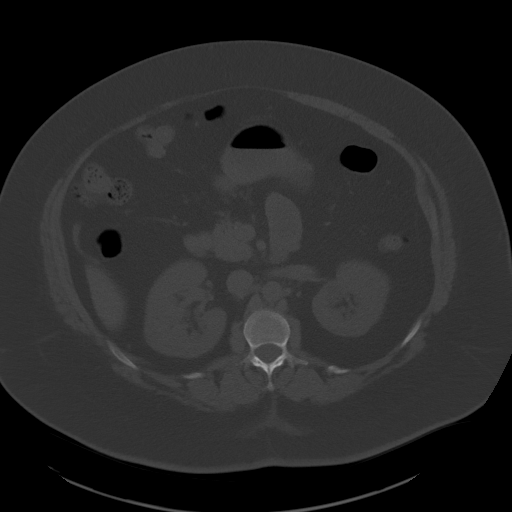
[im 72/100  soft-tissue]
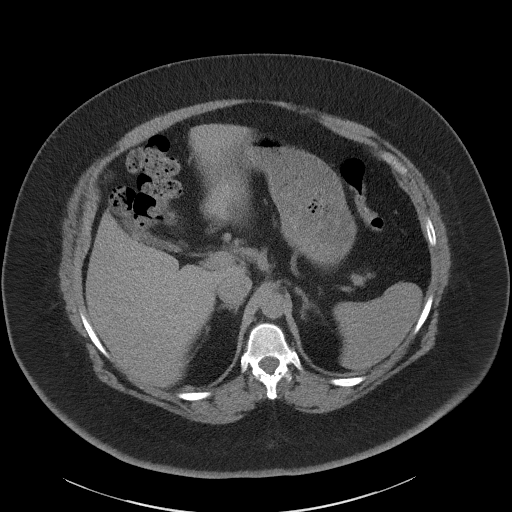
[im 80/100  soft-tissue]
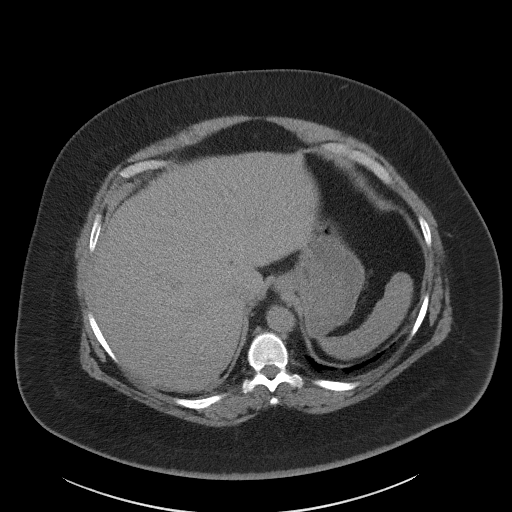
[im 84/100  lung]
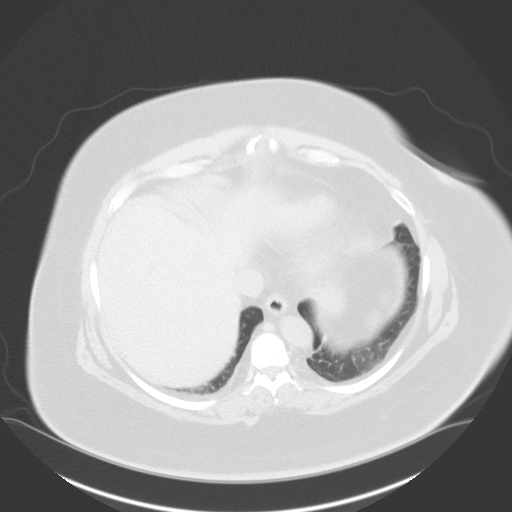
[im 88/100  soft-tissue]
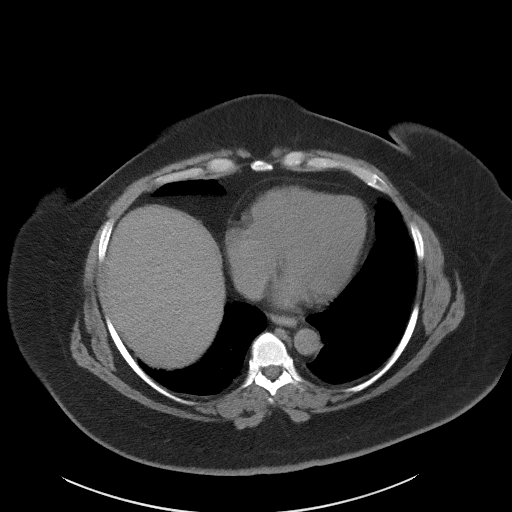
[im 88/100  lung]
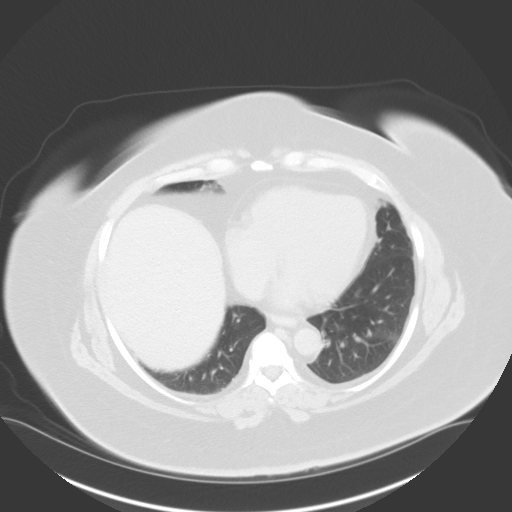
[im 92/100  lung]
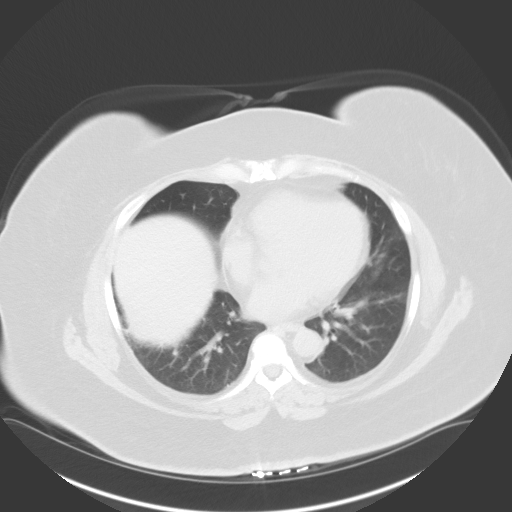
[im 96/100  soft-tissue]
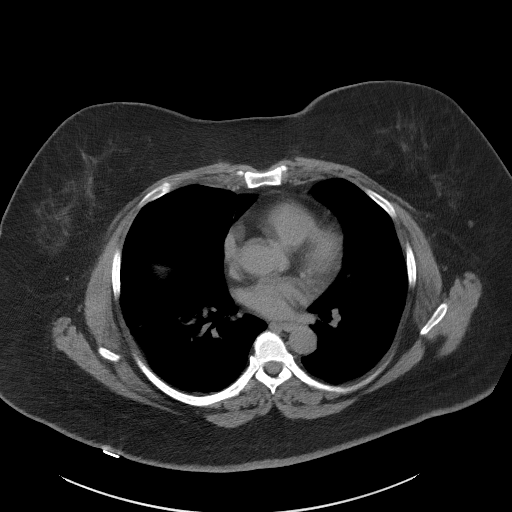
[im 96/100  lung]
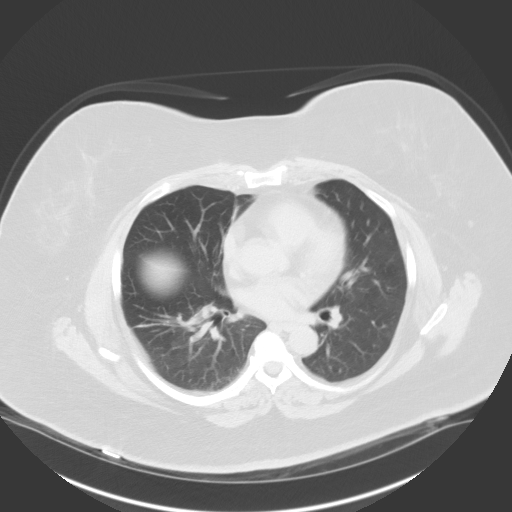

[15 of 32 positions shown; findings below may reference images not displayed]

FINDINGS: Lower chest: No acute abnormality.

Hepatobiliary: No focal liver abnormality is seen. No gallstones,
gallbladder wall thickening, or biliary dilatation.

Pancreas: Unremarkable. No pancreatic ductal dilatation or
surrounding inflammatory changes.

Spleen: Normal in size without focal abnormality.

Adrenals/Urinary Tract: Adrenal glands are unremarkable. Kidneys are
normal, without renal calculi, focal lesion, or hydronephrosis.
Bladder is unremarkable.

Stomach/Bowel: Stomach is within normal limits. Appendix appears
normal. No evidence of bowel wall thickening, distention, or
inflammatory changes.

Vascular/Lymphatic: No significant vascular findings are present. No
enlarged abdominal or pelvic lymph nodes.

Reproductive: Uterus and bilateral adnexa are unremarkable.

Other: No abdominal wall hernia or abnormality. No abdominopelvic
ascites.

Musculoskeletal: No acute or significant osseous findings.
IMPRESSION: No abnormality seen in the abdomen or pelvis.

## 2019-05-25 MED ORDER — KETOROLAC TROMETHAMINE 10 MG PO TABS: 10.0000 mg | ORAL_TABLET | Freq: Four times a day (QID) | ORAL | 0 refills | Status: DC | PRN

## 2019-05-25 NOTE — ED Provider Notes (Addendum)
[facility]    CSN: [id number] Arrival date & time: 05/25/19  1133  History   Chief Complaint Chief Complaint  Patient presents with  . Flank Pain    left   HPI  49 year old female presents with left-sided flank pain.  Started on Monday.  Patient reports that her pain is steadily worsening.  Located on the left flank.  9/10 in severity.  Worsening.  She has chronic back pain but states that this is different.  No urinary symptoms. No relieving factors.  No fall, trauma, injury.  No other reported symptoms.  No other complaints.  Past Medical History:  Diagnosis Date  . Anxiety   . Asthma   . Carpal tunnel syndrome   . Depression   . Disc degeneration, lumbosacral   . Hypertension   . Migraine   . Thyroid disease    Past Surgical History:  Procedure Laterality Date  . CARPAL TUNNEL RELEASE     bilateral  . CARPAL TUNNEL RELEASE Right 10/20/2017   Procedure: CARPAL TUNNEL RELEASE;  Surgeon: Leanor Kail, MD;  Location: [facility] ORS;  Service: Orthopedics;  Laterality: Right;  . CESAREAN SECTION     OB History   No obstetric history on file.    Home Medications    Prior to Admission medications   Medication Sig Start Date End Date Taking? Authorizing Provider  albuterol (PROVENTIL HFA;VENTOLIN HFA) 108 (90 Base) MCG/ACT inhaler Inhale 2 puffs into the lungs every 8 (eight) hours as needed for wheezing or shortness of breath.   Yes [provider]  carvedilol (COREG) 6.25 MG tablet Take 6.25 mg by mouth 2 (two) times daily with a meal.   Yes [provider]  clonazePAM (KLONOPIN) 1 MG tablet Take 1 tablet by mouth daily as needed for anxiety. 07/15/17  Yes [provider]  cyclobenzaprine (FLEXERIL) 10 MG tablet TAKE 1/2 TO 1 TABLET POR V A ORAL AL ACOSTARSE CUANDO SEA NECESARIO 10/07/18  Yes [provider]  hydrochlorothiazide (MICROZIDE) 12.5 MG capsule Take 12.5 mg by mouth daily.   Yes [provider]    levothyroxine (SYNTHROID) 112 MCG tablet Take by mouth. 03/02/18  Yes [provider]  montelukast (SINGULAIR) 10 MG tablet Take by mouth. 03/16/18  Yes [provider]  Tiotropium Bromide Monohydrate (SPIRIVA RESPIMAT) 1.25 MCG/ACT AERS Inhale into the lungs. 05/16/18  Yes [provider]  venlafaxine XR (EFFEXOR-XR) 150 MG 24 hr capsule Take 1 capsule by mouth daily. 07/15/17  Yes [provider]  ketorolac (TORADOL) 10 MG tablet Take 1 tablet (10 mg total) by mouth every 6 (six) hours as needed for moderate pain or severe pain. 05/25/19   Coral Spikes, DO  gabapentin (NEURONTIN) 400 MG capsule Take 400 mg by mouth every morning.  05/25/19  [provider]    Family History Family History  Problem Relation Age of Onset  . Hypertension Mother   . Depression Mother   . Heart disease Mother   . Thyroid disease Mother   . Heart disease Father   . Heart attack Father   . Cancer Paternal Aunt   . Cancer Paternal Uncle   . Breast cancer Neg Hx     Social History Social History   Tobacco Use  . Smoking status: Never Smoker  . Smokeless tobacco: Never Used  Substance Use Topics  . Alcohol use: Yes    Comment: rarely  . Drug use: No     Allergies  Morphine and related   Review of Systems Review of Systems  Constitutional: Negative.   Genitourinary: Positive for flank pain.   Physical Exam Triage Vital Signs ED Triage Vitals  Enc Vitals Group     BP 05/25/19 1159 117/81     Pulse Rate 05/25/19 1159 93     Resp 05/25/19 1159 18     Temp 05/25/19 1159 98.3 F (36.8 C)     Temp Source 05/25/19 1159 Oral     SpO2 05/25/19 1159 98 %     Weight 05/25/19 1153 270 lb 1 oz (122.5 kg)     Height 05/25/19 1153 5\' 1"  (1.549 m)     Head Circumference --      Peak Flow --      Pain Score 05/25/19 1153 9     Pain Loc --      Pain Edu? --      Excl. in [place]? --    Updated Vital Signs BP 117/81 (BP Location: Right Arm)   Pulse 93    Temp 98.3 F (36.8 C) (Oral)   Resp 18   Ht 5\' 1"  (1.549 m)   Wt 122.5 kg   LMP 05/17/2018   SpO2 98%   BMI 51.03 kg/m   Visual Acuity Right Eye Distance:   Left Eye Distance:   Bilateral Distance:    Right Eye Near:   Left Eye Near:    Bilateral Near:     Physical Exam Vitals and nursing note reviewed.  Constitutional:      General: She is not in acute distress.    Appearance: Normal appearance. She is not ill-appearing.  HENT:     Head: Normocephalic and atraumatic.  Eyes:     General:        Right eye: No discharge.        Left eye: No discharge.     Conjunctiva/sclera: Conjunctivae normal.  Cardiovascular:     Rate and Rhythm: Normal rate and regular rhythm.  Pulmonary:     Effort: Pulmonary effort is normal.     Breath sounds: No wheezing, rhonchi or rales.  Abdominal:     Tenderness: There is no right CVA tenderness or left CVA tenderness.  Neurological:     Mental Status: She is alert.  Psychiatric:        Mood and Affect: Mood normal.        Behavior: Behavior normal.    UC Treatments / Results  Labs (all labs ordered are listed, but only abnormal results are displayed) Labs Reviewed  URINALYSIS, COMPLETE (UACMP) WITH MICROSCOPIC - Abnormal; Notable for the following components:      Result Value   Hgb urine dipstick TRACE (*)    Bacteria, UA FEW (*)    All other components within normal limits  PREGNANCY, URINE    EKG   Radiology CT Renal Stone Study  Result Date: 05/25/2019 CLINICAL DATA:  Acute left flank pain. EXAM: CT ABDOMEN AND PELVIS WITHOUT CONTRAST TECHNIQUE: Multidetector CT imaging of the abdomen and pelvis was performed following the standard protocol without IV contrast. COMPARISON:  None. FINDINGS: Lower chest: No acute abnormality. Hepatobiliary: No focal liver abnormality is seen. No gallstones, gallbladder wall thickening, or biliary dilatation. Pancreas: Unremarkable. No pancreatic ductal dilatation or surrounding inflammatory  changes. Spleen: Normal in size without focal abnormality. Adrenals/Urinary Tract: Adrenal glands are unremarkable. Kidneys are normal, without renal calculi, focal lesion, or hydronephrosis. Bladder is unremarkable. Stomach/Bowel: Stomach is within normal limits. Appendix  appears normal. No evidence of bowel wall thickening, distention, or inflammatory changes. Vascular/Lymphatic: No significant vascular findings are present. No enlarged abdominal or pelvic lymph nodes. Reproductive: Uterus and bilateral adnexa are unremarkable. Other: No abdominal wall hernia or abnormality. No abdominopelvic ascites. Musculoskeletal: No acute or significant osseous findings. IMPRESSION: No abnormality seen in the abdomen or pelvis. Electronically Signed   By: Marijo Conception M.D.   On: 05/25/2019 13:43    Procedures Procedures (including critical care time)  Medications Ordered in UC Medications - No data to display  Initial Impression / Assessment and Plan / UC Course  I have reviewed the triage vital signs and the nursing notes.  Pertinent labs & imaging results that were available during my care of the patient were reviewed by me and considered in my medical decision making (see chart for details).    49 year old female presents with flank pain.  Given acute nature, concern for nephrolithiasis/ureterolithiasis.  CT obtained.  CT independently interpreted.  Interpretation: No acute findings in the abdomen or pelvis.  No apparent kidney stone.  Toradol as directed.  Follow-up with regular physician.  Supportive care.  Final Clinical Impressions(s) / UC Diagnoses   Final diagnoses:  Flank pain     Discharge Instructions     Medication as prescribed.  CT negative today.  Follow up with your regular physician.  Take care  Dr. Lacinda Axon     ED Prescriptions    Medication Sig Dispense Auth. Provider   ketorolac (TORADOL) 10 MG tablet Take 1 tablet (10 mg total) by mouth every 6 (six) hours as needed  for moderate pain or severe pain. 20 tablet Thersa Salt G, DO     I have reviewed the PDMP during this encounter.    Coral Spikes, DO 05/25/19 1414

## 2019-05-25 NOTE — Discharge Instructions (Signed)
Medication as prescribed.  CT negative today.  Follow up with your regular physician.  Take care  Dr. Lacinda Axon

## 2019-05-25 NOTE — Telephone Encounter (Signed)
PA obtained for CT abd/pelvis without contrast. PA# [id number] effective 05/25/19-06/24/19

## 2019-05-25 NOTE — ED Triage Notes (Signed)
Pt c/o left sided flank pain. Started about 4 days ago. She states it has gotten progressively worse since it started. Denies urinary symptoms.

## 2019-07-18 ENCOUNTER — Other Ambulatory Visit: Payer: Self-pay | Admitting: Orthopedic Surgery

## 2019-07-18 DIAGNOSIS — M5412 Radiculopathy, cervical region: Secondary | ICD-10-CM

## 2019-07-18 DIAGNOSIS — G8929 Other chronic pain: Secondary | ICD-10-CM

## 2019-07-18 DIAGNOSIS — M9951 Intervertebral disc stenosis of neural canal of cervical region: Secondary | ICD-10-CM

## 2019-07-18 DIAGNOSIS — M542 Cervicalgia: Secondary | ICD-10-CM

## 2019-07-30 ENCOUNTER — Encounter: Payer: Self-pay | Admitting: Emergency Medicine

## 2019-07-30 ENCOUNTER — Ambulatory Visit
Admission: EM | Admit: 2019-07-30 | Discharge: 2019-07-30 | Disposition: A | Discharge status: Home / Self Care | Payer: Medicare HMO | Attending: Emergency Medicine | Admitting: Emergency Medicine

## 2019-07-30 ENCOUNTER — Other Ambulatory Visit: Payer: Self-pay

## 2019-07-30 DIAGNOSIS — L304 Erythema intertrigo: Secondary | ICD-10-CM | POA: Diagnosis not present

## 2019-07-30 MED ORDER — TERBINAFINE HCL 1 % EX CREA: 1.0000 "application " | TOPICAL_CREAM | Freq: Two times a day (BID) | CUTANEOUS | 0 refills | Status: DC

## 2019-07-30 MED ORDER — FLUCONAZOLE 150 MG PO TABS: 150.0000 mg | ORAL_TABLET | ORAL | 0 refills | Status: DC

## 2019-07-30 NOTE — Discharge Instructions (Signed)
You were seen for a rash to your breast and are being treated for a fungal infection.   Take medication as prescribed. Do not use any additional creams or powders. Keep the area as dry as possible.  Take care, Dr. Marland Kitchen, NP-c

## 2019-07-30 NOTE — ED Triage Notes (Signed)
Patient c/o rash under her breasts that started 3 days ago.  Patient c/o soreness and itching under her breasts

## 2019-07-30 NOTE — ED Provider Notes (Signed)
[facility] - [place], [place]   Name: [name] DOB: 10-10-1970 MRN: [id number] CSN: [id number] PCP: Hortencia Pilar, MD  Arrival date and time:  07/30/19 1307  Chief Complaint:  Rash   NOTE: Prior to seeing the patient today, I have reviewed the triage nursing documentation and vital signs. Clinical staff has updated patient's PMH/PSHx, current medication list, and drug allergies/intolerances to ensure comprehensive history available to assist in medical decision making.   History:   HPI: [name] is a 49 y.o. female who presents today with complaints of bilateral breast.  Patient states she noticed this rash approximately 3 days ago she has been attempting to self treat with over-the-counter medications.  She has tried topical antibacterials, antifungals and baby powder.  She has noticed little to no relief from all those topical treatments.  Last night the discomfort spread to her breast tissue surrounding her nipples.  The pain and discomfort were so severe that she was unable to sleep.  She denies any fevers, shortness of breath, or change in activity.  She states she has never had a rash like this before.   Past Medical History:  Diagnosis Date  . Anxiety   . Asthma   . Carpal tunnel syndrome   . Depression   . Disc degeneration, lumbosacral   . Hypertension   . Migraine   . Thyroid disease     Past Surgical History:  Procedure Laterality Date  . CARPAL TUNNEL RELEASE     bilateral  . CARPAL TUNNEL RELEASE Right 10/20/2017   Procedure: CARPAL TUNNEL RELEASE;  Surgeon: Leanor Kail, MD;  Location: [facility];  Service: Orthopedics;  Laterality: Right;  . CESAREAN SECTION      Family History  Problem Relation Age of Onset  . Hypertension Mother   . Depression Mother   . Heart disease Mother   . Thyroid disease Mother   . Heart disease Father   . Heart attack Father   . Cancer Paternal Aunt   . Cancer Paternal Uncle   . Breast  cancer Neg Hx     Social History   Tobacco Use  . Smoking status: Never Smoker  . Smokeless tobacco: Never Used  Vaping Use  . Vaping Use: Never used  Substance Use Topics  . Alcohol use: Yes    Comment: rarely  . Drug use: No    There are no problems to display for this patient.   Home Medications:    Current Meds  Medication Sig  . albuterol (PROVENTIL HFA;VENTOLIN HFA) 108 (90 Base) MCG/ACT inhaler Inhale 2 puffs into the lungs every 8 (eight) hours as needed for wheezing or shortness of breath.  . carvedilol (COREG) 6.25 MG tablet Take 6.25 mg by mouth 2 (two) times daily with a meal.  . clonazePAM (KLONOPIN) 1 MG tablet Take 1 tablet by mouth daily as needed for anxiety.  . hydrochlorothiazide (MICROZIDE) 12.5 MG capsule Take 12.5 mg by mouth daily.  Marland Kitchen levothyroxine (SYNTHROID) 112 MCG tablet Take by mouth.  . montelukast (SINGULAIR) 10 MG tablet Take by mouth.  . Tiotropium Bromide Monohydrate (SPIRIVA RESPIMAT) 1.25 MCG/ACT AERS Inhale into the lungs.  . venlafaxine XR (EFFEXOR-XR) 150 MG 24 hr capsule Take 1 capsule by mouth daily.    Allergies:   Morphine and related  Review of Systems (ROS): Review of Systems  Constitutional: Negative for chills, fatigue and fever.  Skin: Positive for rash.  All other systems reviewed and are negative.  Vital Signs: Today's Vitals   07/30/19 1323 07/30/19 1327 07/30/19 1358  BP:  121/69   Pulse:  84   Resp:  16   Temp:  98 F (36.7 C)   TempSrc:  Oral   SpO2:  95%   Weight: 257 lb (116.6 kg)    Height: 5\' 1"  (1.549 m)    PainSc: 7   7     Physical Exam: Physical Exam Vitals and nursing note reviewed.  Constitutional:      Appearance: Normal appearance. She is obese.  Cardiovascular:     Rate and Rhythm: Normal rate and regular rhythm.     Heart sounds: Normal heart sounds.  Pulmonary:     Breath sounds: Normal breath sounds.  Chest:     Breasts:        Right: Skin change present.        Left: Skin  change present.     Comments: Moist red patches underneath bilateral breast.  Fissuring is present.  Scant erythematous satellite papules suggesting candidal involvement. Neurological:     Mental Status: She is alert.  Psychiatric:        Mood and Affect: Mood normal.        Thought Content: Thought content normal.      Urgent Care Treatments / Results:   LABS: PLEASE NOTE: all labs that were ordered this encounter are listed, however only abnormal results are displayed. Labs Reviewed - No data to display  EKG: -None  RADIOLOGY: No results found.  PROCEDURES: Procedures  MEDICATIONS RECEIVED THIS VISIT: Medications - No data to display  PERTINENT CLINICAL COURSE NOTES/UPDATES:   Initial Impression / Assessment and Plan / Urgent Care Course:  Pertinent labs & imaging results that were available during my care of the patient were personally reviewed by me and considered in my medical decision making (see lab/imaging section of note for values and interpretations).  [name] is a 49 y.o. female who presents to [facility] today with complaints of rash to both breasts, diagnosed with intertrigo, and treated as such with the medications below. NP and patient reviewed discharge instructions below during visit.   Patient is well appearing overall in clinic today. She does not appear to be in any acute distress. Presenting symptoms (see HPI) and exam as documented above.   I have reviewed the follow up and strict return precautions for any new or worsening symptoms. Patient is aware of symptoms that would be deemed urgent/emergent, and would thus require further evaluation either here or in the emergency department. At the time of discharge, she verbalized understanding and consent with the discharge plan as it was reviewed with her. All questions were fielded by provider and/or clinic staff prior to patient discharge.    Final Clinical Impressions / Urgent Care  Diagnoses:   Final diagnoses:  Intertrigo    New Prescriptions:  [place] Controlled Substance Registry consulted? Not Applicable  Meds ordered this encounter  Medications  . fluconazole (DIFLUCAN) 150 MG tablet    Sig: Take 1 tablet (150 mg total) by mouth every 3 (three) days.    Dispense:  2 tablet    Refill:  0  . terbinafine (LAMISIL) 1 % cream    Sig: Apply 1 application topically 2 (two) times daily.    Dispense:  30 g    Refill:  0      Discharge Instructions     You were seen for a rash to your breast and  are being treated for a fungal infection.   Take medication as prescribed. Do not use any additional creams or powders. Keep the area as dry as possible.  Take care, Dr. Marland Kitchen, NP-c     Recommended Follow up Care:  Patient encouraged to follow up with the following provider within the specified time frame, or sooner as dictated by the severity of her symptoms. As always, she was instructed that for any urgent/emergent care needs, she should seek care either here or in the emergency department for more immediate evaluation.   Gertie Baron, DNP, NP-c    Gertie Baron, NP 07/30/19 1428

## 2019-08-01 ENCOUNTER — Ambulatory Visit
Admission: RE | Admit: 2019-08-01 | Discharge: 2019-08-01 | Disposition: A | Discharge status: Home / Self Care | Payer: Medicare HMO | Source: Ambulatory Visit | Attending: Orthopedic Surgery | Admitting: Orthopedic Surgery

## 2019-08-01 ENCOUNTER — Other Ambulatory Visit: Payer: Self-pay

## 2019-08-01 DIAGNOSIS — M542 Cervicalgia: Secondary | ICD-10-CM | POA: Insufficient documentation

## 2019-08-01 DIAGNOSIS — M9951 Intervertebral disc stenosis of neural canal of cervical region: Secondary | ICD-10-CM

## 2019-08-01 DIAGNOSIS — M5412 Radiculopathy, cervical region: Secondary | ICD-10-CM | POA: Diagnosis not present

## 2019-08-01 DIAGNOSIS — G8929 Other chronic pain: Secondary | ICD-10-CM | POA: Insufficient documentation

## 2019-08-01 IMAGING — MR MR CERVICAL SPINE W/O CM
5 series · 38 of 48 positions shown · non-contrast
Comparison: Cervical spine MRI [DATE]

CLINICAL DATA: Cervical radiculopathy. Intervertebral discs
stenosis of neural canal or cervical region. Chronic neck pain.
Additional history provided by technologist: Patient reports right
neck and arm pain, history of carpal tunnel surgery on the right.

EXAM:
MRI CERVICAL SPINE WITHOUT CONTRAST
TECHNIQUE: Multiplanar, multisequence MR imaging of the cervical spine was
performed. No intravenous contrast was administered.

[Series 5: T2 · sagittal · 3.0mm · 0.62mm/px · 8 of 15 slices shown (1 of 2)]
[im 1/15]
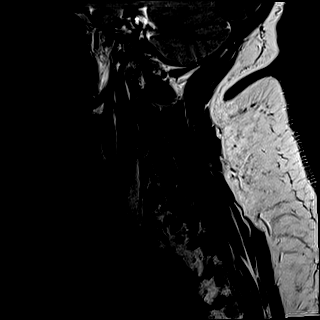
[im 3/15]
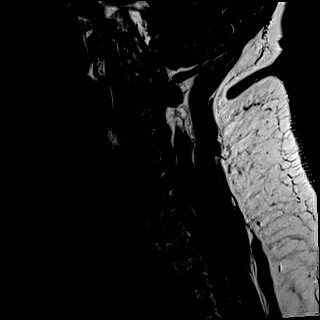
[im 5/15]
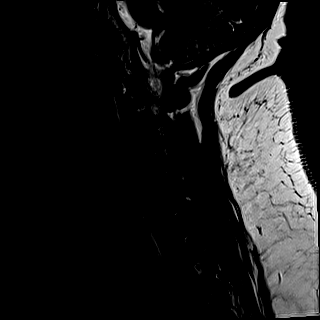
[im 7/15]
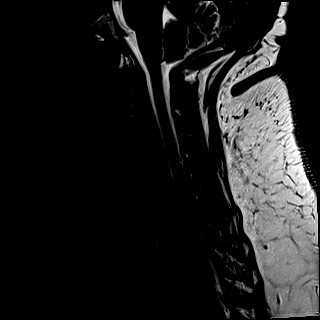
[im 9/15]
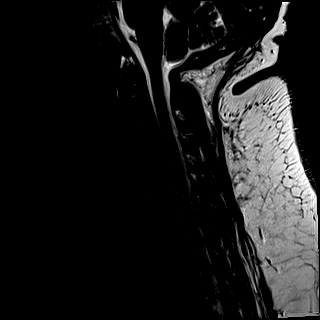
[im 11/15]
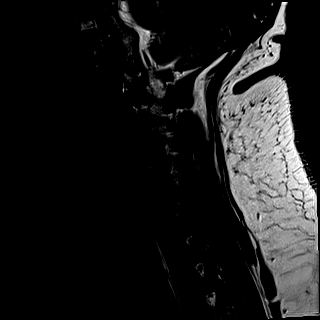
[im 13/15]
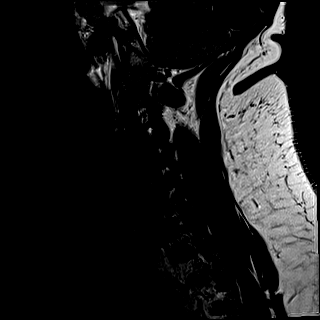
[im 15/15]
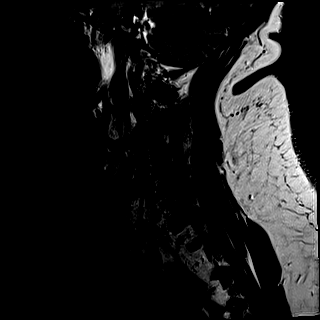

[Series 6: FLAIR · sagittal · 3.0mm · 0.78mm/px · 7 of 15 slices shown]
[im 1/15]
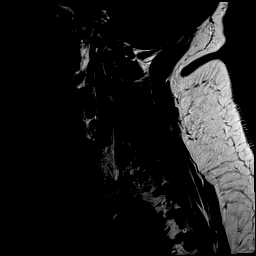
[im 3/15]
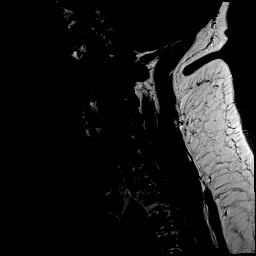
[im 5/15]
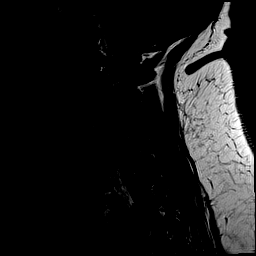
[im 8/15]
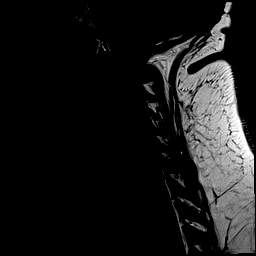
[im 10/15]
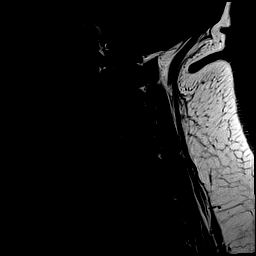
[im 12/15]
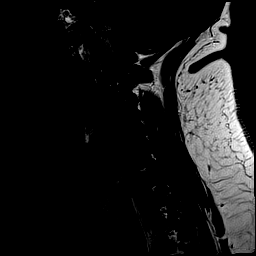
[im 15/15]
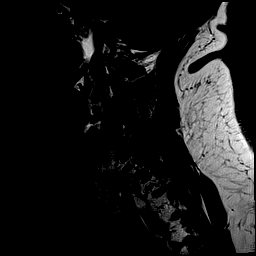

[Series 7: STIR · sagittal · 3.0mm · 0.62mm/px · 7 of 15 slices shown]
[im 1/15]
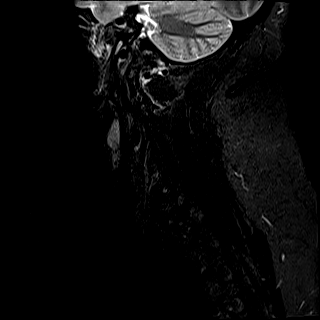
[im 3/15]
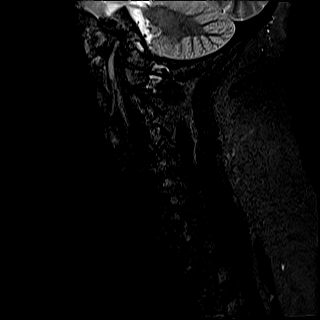
[im 5/15]
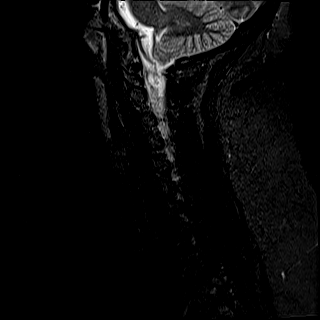
[im 8/15]
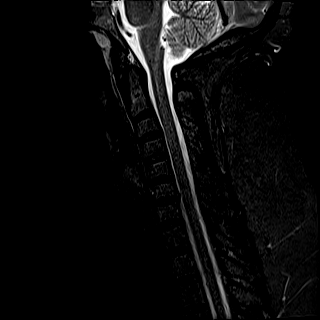
[im 10/15]
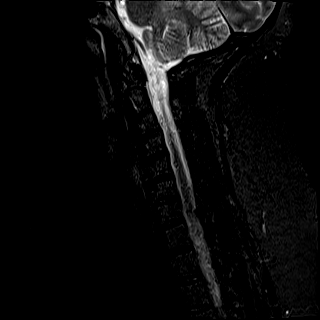
[im 12/15]
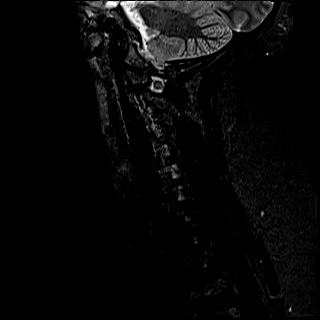
[im 15/15]
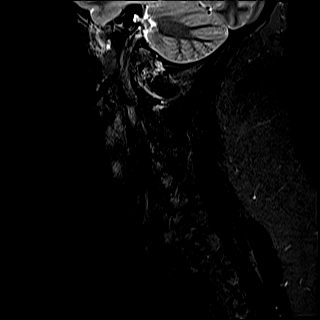

[Series 8: T2 · axial · 3.0mm · 0.70mm/px · z∈[-144,-55]mm · 9 of 27 slices shown (2 of 2)]
[im 1/27]
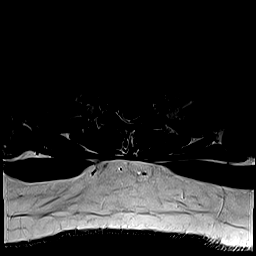
[im 5/27]
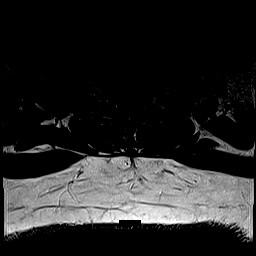
[im 9/27]
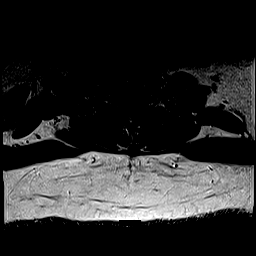
[im 11/27]
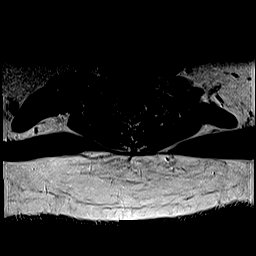
[im 14/27]
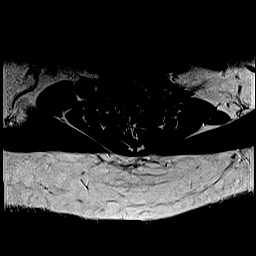
[im 16/27]
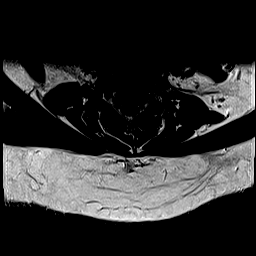
[im 18/27]
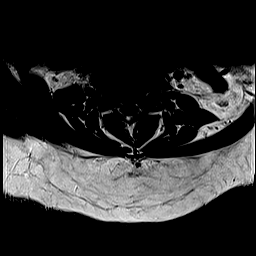
[im 22/27]
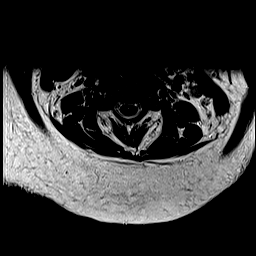
[im 27/27]
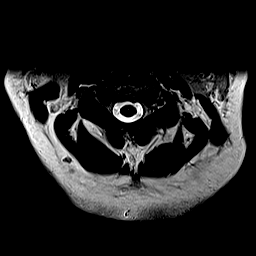

[Series 9: ax mpgr · axial · 3.0mm · 0.35mm/px · z∈[-144,-72]mm · 7 of 27 slices shown]
[im 1/27]
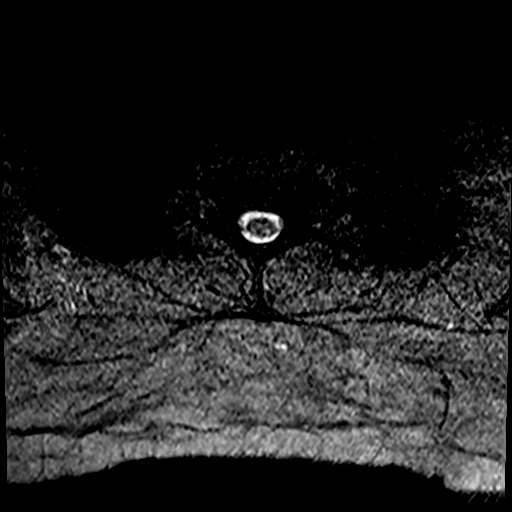
[im 5/27]
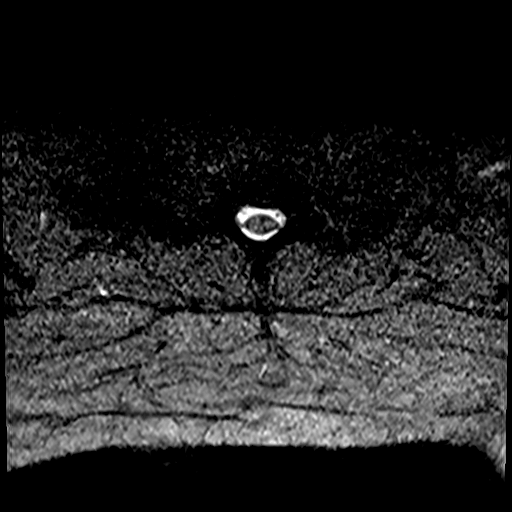
[im 9/27]
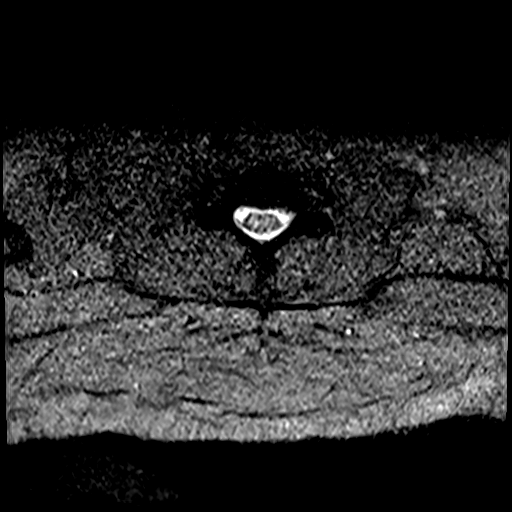
[im 11/27]
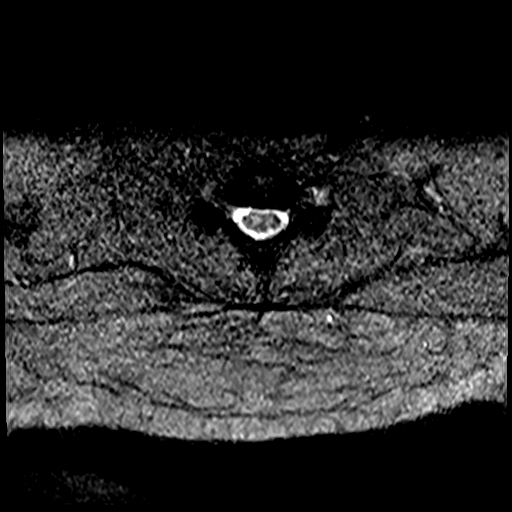
[im 16/27]
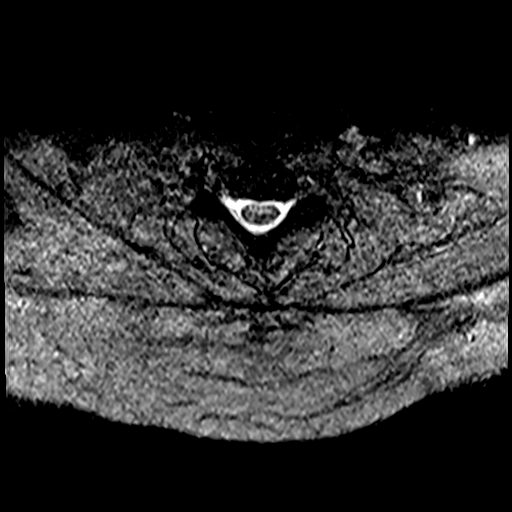
[im 18/27]
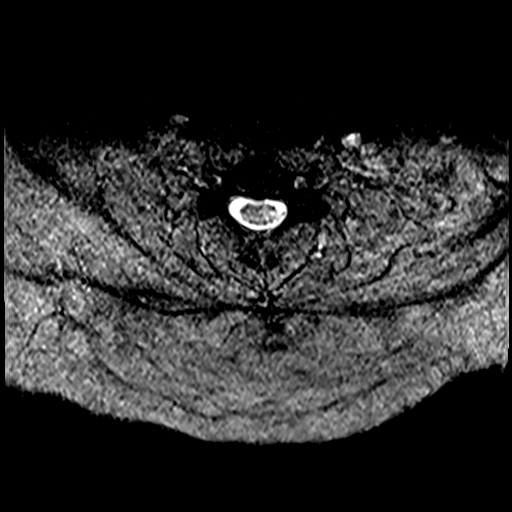
[im 22/27]
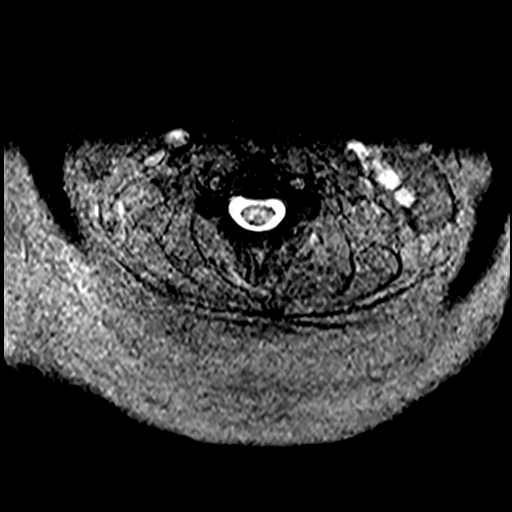

[38 of 48 positions shown; findings below may reference images not displayed]

FINDINGS: Mild intermittent motion degradation.

Alignment: Straightening of the expected cervical lordosis. No
significant spondylolisthesis.

Vertebrae: There is no suspicious osseous lesion or significant
marrow edema.

Cord: No spinal cord signal abnormality is identified.

Posterior Fossa, vertebral arteries, paraspinal tissues: No
abnormality is identified within included portions of the posterior
fossa. Flow voids preserved within the imaged cervical vertebral
arteries. Paraspinal soft tissues within normal limits.

Disc levels:

Unless otherwise stated, the level by level findings below have not
significantly changed since prior MRI [DATE].

Unchanged mild multilevel disc degeneration.

C2-C3: Minimal facet hypertrophy.  No stenosis.

C3-C4: Minimal facet hypertrophy.  No stenosis.

C4-C5: Minimal facet hypertrophy.  No stenosis.

C5-C6: Minimal facet hypertrophy.  No stenosis.

C6-C7: Disc bulge with superimposed small right center disc
protrusion. Mild endplate spurring. Mild relative spinal canal
narrowing without spinal cord mass effect. No significant foraminal
stenosis.

C7-T1: Mild endplate spurring and facet hypertrophy. No significant
spinal canal stenosis. Mild/mild right neural foraminal narrowing.
IMPRESSION: Cervical spondylosis as described and unchanged as compared to prior
MRI [DATE]. Findings are most notably as follows.

At C6-C7, there is a disc bulge with superimposed small right center
disc protrusion. Mild endplate spurring. Mild relative spinal canal
narrowing without spinal cord mass effect. No significant foraminal
stenosis.

At C7-T1, mild endplate spurring and facet hypertrophy.
Mild/moderate right neural foraminal narrowing. No significant canal
stenosis.

Minimal facet hypertrophy at the remaining levels without stenosis.

## 2019-08-18 ENCOUNTER — Encounter: Payer: Self-pay | Admitting: Emergency Medicine

## 2019-08-18 ENCOUNTER — Other Ambulatory Visit: Payer: Self-pay

## 2019-08-18 ENCOUNTER — Ambulatory Visit
Admission: EM | Admit: 2019-08-18 | Discharge: 2019-08-18 | Disposition: A | Discharge status: Home / Self Care | Payer: Medicare HMO | Attending: Emergency Medicine | Admitting: Emergency Medicine

## 2019-08-18 DIAGNOSIS — J0100 Acute maxillary sinusitis, unspecified: Secondary | ICD-10-CM | POA: Insufficient documentation

## 2019-08-18 DIAGNOSIS — Z20822 Contact with and (suspected) exposure to covid-19: Secondary | ICD-10-CM | POA: Insufficient documentation

## 2019-08-18 DIAGNOSIS — J069 Acute upper respiratory infection, unspecified: Secondary | ICD-10-CM | POA: Insufficient documentation

## 2019-08-18 MED ORDER — IBUPROFEN 600 MG PO TABS: 600.0000 mg | ORAL_TABLET | Freq: Four times a day (QID) | ORAL | 0 refills | Status: DC | PRN

## 2019-08-18 MED ORDER — BENZONATATE 200 MG PO CAPS: 200.0000 mg | ORAL_CAPSULE | Freq: Three times a day (TID) | ORAL | 0 refills | Status: DC | PRN

## 2019-08-18 MED ORDER — FLUTICASONE PROPIONATE 50 MCG/ACT NA SUSP: 2.0000 | Freq: Every day | NASAL | 0 refills | Status: DC

## 2019-08-18 NOTE — Discharge Instructions (Addendum)
Start Mucinex to keep the mucous thin.  I have written another prescription of Flonase in case you run out.  It is not on your current medication list.  You may take 600 mg of motrin with 1 gram of tylenol up to 3-4 times a day as needed for pain. This is an effective combination for pain.  Most sinus infections are viral and do not need antibiotics unless you have a high fever, have had this for 10 days, or you get better and then get sick again. Use a NeilMed sinus rinse with distilled water as often as you want to to reduce nasal congestion. Follow the directions on the box.  Tessalon for the cough.  Return to the ER if you get worse, have a fever >100.4, or for any concerns.   Go to www.goodrx.com to look up your medications. This will give you a list of where you can find your prescriptions at the most affordable prices. Or you can ask the pharmacist what the cash price is. This is frequently cheaper than going through insurance.

## 2019-08-18 NOTE — ED Triage Notes (Signed)
Patient c/o stuffy nose, sinus congestion and pressure and dry cough that started 2 days ago.  Patient denies fevers.

## 2019-08-18 NOTE — ED Provider Notes (Signed)
HPI  SUBJECTIVE:  [name] is a 49 y.o. female who presents with 2 days of nasal congestion, cough, sinus pain and pressure, body aches, sore throat yesterday secondary to the cough.  No fevers, headaches, rhinorrhea, postnasal drip.  She reports loss of smell secondary to the nasal congestion, reports intact sense of taste.  No chest pain, wheezing, shortness of breath, nausea, vomiting, diarrhea, abdominal pain.  No known Covid exposure.  She got both doses of the [employer] Covid vaccine.  No facial swelling, upper dental pain.  States that her mother and niece had identical symptoms last week, they were not tested for Covid.  No allergy symptoms.  No antibiotics in the past month.  No antipyretic in the past 6 hours.  Patient has tried Vicks, pushing fluids, Tussin, Flonase.  No aggravating or alleviating factors.  She has a past medical history of asthma, allergies, hypertension, hypothyroidism.  No history of smoking, frequent sinusitis, diabetes.  LMP: April 14.  Denies the possibility being pregnant.  PMD: Hortencia Pilar, MD    Past Medical History:  Diagnosis Date  . Anxiety   . Asthma   . Carpal tunnel syndrome   . Depression   . Disc degeneration, lumbosacral   . Hypertension   . Migraine   . Thyroid disease     Past Surgical History:  Procedure Laterality Date  . CARPAL TUNNEL RELEASE     bilateral  . CARPAL TUNNEL RELEASE Right 10/20/2017   Procedure: CARPAL TUNNEL RELEASE;  Surgeon: Leanor Kail, MD;  Location: [facility] OR[facility];  Service: Orthopedics;  Laterality: Right;  . CESAREAN SECTION      Family History  Problem Relation Age of Onset  . Hypertension Mother   . Depression Mother   . Heart disease Mother   . Thyroid disease Mother   . Heart disease Father   . Heart attack Father   . Cancer Paternal Aunt   . Cancer Paternal Uncle   . Breast cancer Neg Hx     Social History   Tobacco Use  . Smoking status: Never Smoker  . Smokeless tobacco: Never  Used  Vaping Use  . Vaping Use: Never used  Substance Use Topics  . Alcohol use: Yes    Comment: rarely  . Drug use: No    No current facility-administered medications for this encounter.  Current Outpatient Medications:  .  albuterol (PROVENTIL HFA;VENTOLIN HFA) 108 (90 Base) MCG/ACT inhaler, Inhale 2 puffs into the lungs every 8 (eight) hours as needed for wheezing or shortness of breath., Disp: , Rfl:  .  carvedilol (COREG) 6.25 MG tablet, Take 6.25 mg by mouth 2 (two) times daily with a meal., Disp: , Rfl:  .  clonazePAM (KLONOPIN) 1 MG tablet, Take 1 tablet by mouth daily as needed for anxiety., Disp: , Rfl: 1 .  cyclobenzaprine (FLEXERIL) 10 MG tablet, TAKE 1/2 TO 1 TABLET POR V A ORAL AL ACOSTARSE CUANDO SEA NECESARIO, Disp: , Rfl:  .  hydrochlorothiazide (MICROZIDE) 12.5 MG capsule, Take 12.5 mg by mouth daily., Disp: , Rfl:  .  levothyroxine (SYNTHROID) 112 MCG tablet, Take by mouth., Disp: , Rfl:  .  montelukast (SINGULAIR) 10 MG tablet, Take by mouth., Disp: , Rfl:  .  terbinafine (LAMISIL) 1 % cream, Apply 1 application topically 2 (two) times daily., Disp: 30 g, Rfl: 0 .  venlafaxine XR (EFFEXOR-XR) 150 MG 24 hr capsule, Take 1 capsule by mouth daily., Disp: , Rfl: 4 .  benzonatate (TESSALON) 200  MG capsule, Take 1 capsule (200 mg total) by mouth 3 (three) times daily as needed for cough., Disp: 30 capsule, Rfl: 0 .  fluconazole (DIFLUCAN) 150 MG tablet, Take 1 tablet (150 mg total) by mouth every 3 (three) days., Disp: 2 tablet, Rfl: 0 .  fluticasone (FLONASE) 50 MCG/ACT nasal spray, Place 2 sprays into both nostrils daily., Disp: 16 g, Rfl: 0 .  ibuprofen (ADVIL) 600 MG tablet, Take 1 tablet (600 mg total) by mouth every 6 (six) hours as needed., Disp: 30 tablet, Rfl: 0 .  Tiotropium Bromide Monohydrate (SPIRIVA RESPIMAT) 1.25 MCG/ACT AERS, Inhale into the lungs., Disp: , Rfl:   Allergies  Allergen Reactions  . Morphine And Related Anaphylaxis     ROS  As noted in  HPI.   Physical Exam  BP 111/79 (BP Location: Right Arm)   Pulse 92   Temp 98.4 F (36.9 C) (Oral)   Resp 16   Ht 5\' 1"  (1.549 m)   Wt 117 kg   LMP 05/17/2018   SpO2 96%   BMI 48.75 kg/m   Constitutional: Well developed, well nourished, no acute distress Eyes:  EOMI, conjunctiva normal bilaterally HENT: Normocephalic, atraumatic,mucus membranes moist.  Extensive nasal congestion with erythematous, swollen turbinates worse on the right.  Positive bilateral maxillary sinus tenderness.  No frontal sinus tenderness.  Erythematous oropharynx.  Tonsils normal size without exudates.  Uvula midline. Neck: No cervical adenopathy Respiratory: Normal inspiratory effort, lungs clear bilaterally, good air movement Cardiovascular: Normal rate regular rhythm no murmurs rubs or gallops GI: nondistended skin: No rash, skin intact Musculoskeletal: no deformities Neurologic: Alert & oriented x 3, no focal neuro deficits Psychiatric: Speech and behavior appropriate   ED Course   Medications - No data to display  Orders Placed This Encounter  Procedures  . SARS CORONAVIRUS 2 (TAT 6-24 HRS) Nasopharyngeal Nasopharyngeal Swab    Standing Status:   Standing    Number of Occurrences:   1    Order Specific Question:   Is this test for diagnosis or screening    Answer:   Screening    Order Specific Question:   Symptomatic for COVID-19 as defined by CDC    Answer:   Yes    Order Specific Question:   Date of Symptom Onset    Answer:   08/16/2019    Order Specific Question:   Hospitalized for COVID-19    Answer:   No    Order Specific Question:   Admitted to ICU for COVID-19    Answer:   No    Order Specific Question:   Previously tested for COVID-19    Answer:   No    Order Specific Question:   Resident in a congregate (group) care setting    Answer:   No    Order Specific Question:   Employed in healthcare setting    Answer:   No    Order Specific Question:   Pregnant    Answer:   No     Order Specific Question:   Has patient completed COVID vaccination(s) (2 doses of Pfizer/Moderna 1 dose of [employer])    Answer:   Yes    No results found for this or any previous visit (from the past 24 hour(s)). No results found.  ED Clinical Impression  1. Upper respiratory tract infection, unspecified type   2. Encounter for laboratory testing for COVID-19 virus   3. Acute non-recurrent maxillary sinusitis      ED  Assessment/Plan  Patient with a URI/sinusitis, most likely viral.  No indications for antibiotics today.  Covid PCR sent.  Home with Tessalon, saline nasal irrigation, Mucinex, ibuprofen 600 mg combined with gauze milligrams of Tylenol 3-4 times a day as needed for pain, continue Flonase.  Will write a prescription for Flonase in case that she does not have enough.  Follow-up with PMD or here in 8 days if not better we can consider antibiotics at that time.   Discussed labs, imaging, MDM, treatment plan, and plan for follow-up with patient.  patient agrees with plan.   Meds ordered this encounter  Medications  . fluticasone (FLONASE) 50 MCG/ACT nasal spray    Sig: Place 2 sprays into both nostrils daily.    Dispense:  16 g    Refill:  0  . ibuprofen (ADVIL) 600 MG tablet    Sig: Take 1 tablet (600 mg total) by mouth every 6 (six) hours as needed.    Dispense:  30 tablet    Refill:  0  . benzonatate (TESSALON) 200 MG capsule    Sig: Take 1 capsule (200 mg total) by mouth 3 (three) times daily as needed for cough.    Dispense:  30 capsule    Refill:  0    *This clinic note was created using [employer]. Therefore, there may be occasional mistakes despite careful proofreading.   ?    Melynda Ripple, MD 08/18/19 1657

## 2019-08-19 LAB — SARS CORONAVIRUS 2 (TAT 6-24 HRS): SARS Coronavirus 2: NEGATIVE

## 2019-10-04 HISTORY — PX: LAPAROSCOPIC GASTRIC SLEEVE RESECTION: SHX5895

## 2019-10-07 ENCOUNTER — Other Ambulatory Visit: Payer: Self-pay

## 2019-10-07 ENCOUNTER — Ambulatory Visit (INDEPENDENT_AMBULATORY_CARE_PROVIDER_SITE_OTHER): Payer: Medicare HMO

## 2019-10-07 ENCOUNTER — Ambulatory Visit
Admission: EM | Admit: 2019-10-07 | Discharge: 2019-10-07 | Disposition: A | Discharge status: Home / Self Care | Payer: Medicare HMO | Attending: Internal Medicine | Admitting: Internal Medicine

## 2019-10-07 ENCOUNTER — Encounter: Payer: Self-pay | Admitting: Emergency Medicine

## 2019-10-07 DIAGNOSIS — M25572 Pain in left ankle and joints of left foot: Secondary | ICD-10-CM

## 2019-10-07 DIAGNOSIS — M79672 Pain in left foot: Secondary | ICD-10-CM

## 2019-10-07 DIAGNOSIS — M779 Enthesopathy, unspecified: Secondary | ICD-10-CM | POA: Diagnosis not present

## 2019-10-07 LAB — CBC WITH DIFFERENTIAL/PLATELET
Abs Immature Granulocytes: 0.06 10*3/uL (ref 0.00–0.07)
Basophils Absolute: 0 10*3/uL (ref 0.0–0.1)
Basophils Relative: 0 %
Eosinophils Absolute: 0.2 10*3/uL (ref 0.0–0.5)
Eosinophils Relative: 1 %
HCT: 43.2 % (ref 36.0–46.0)
Hemoglobin: 15 g/dL (ref 12.0–15.0)
Immature Granulocytes: 1 %
Lymphocytes Relative: 22 %
Lymphs Abs: 2.5 10*3/uL (ref 0.7–4.0)
MCH: 31.9 pg (ref 26.0–34.0)
MCHC: 34.7 g/dL (ref 30.0–36.0)
MCV: 91.9 fL (ref 80.0–100.0)
Monocytes Absolute: 1.2 10*3/uL — ABNORMAL HIGH (ref 0.1–1.0)
Monocytes Relative: 10 %
Neutro Abs: 7.6 10*3/uL (ref 1.7–7.7)
Neutrophils Relative %: 66 %
Platelets: 432 10*3/uL — ABNORMAL HIGH (ref 150–400)
RBC: 4.7 MIL/uL (ref 3.87–5.11)
RDW: 14.1 % (ref 11.5–15.5)
WBC: 11.5 10*3/uL — ABNORMAL HIGH (ref 4.0–10.5)
nRBC: 0 % (ref 0.0–0.2)

## 2019-10-07 IMAGING — CR DG FOOT COMPLETE 3+V*L*
3 series · 3 of 3 positions shown · non-contrast
Comparison: None.

CLINICAL DATA: Pain in the region of the left 1st metatarsal and
left ankle the past 4 days. No known injury.

EXAM:
LEFT FOOT - COMPLETE 3+ VIEW

[foot ap]
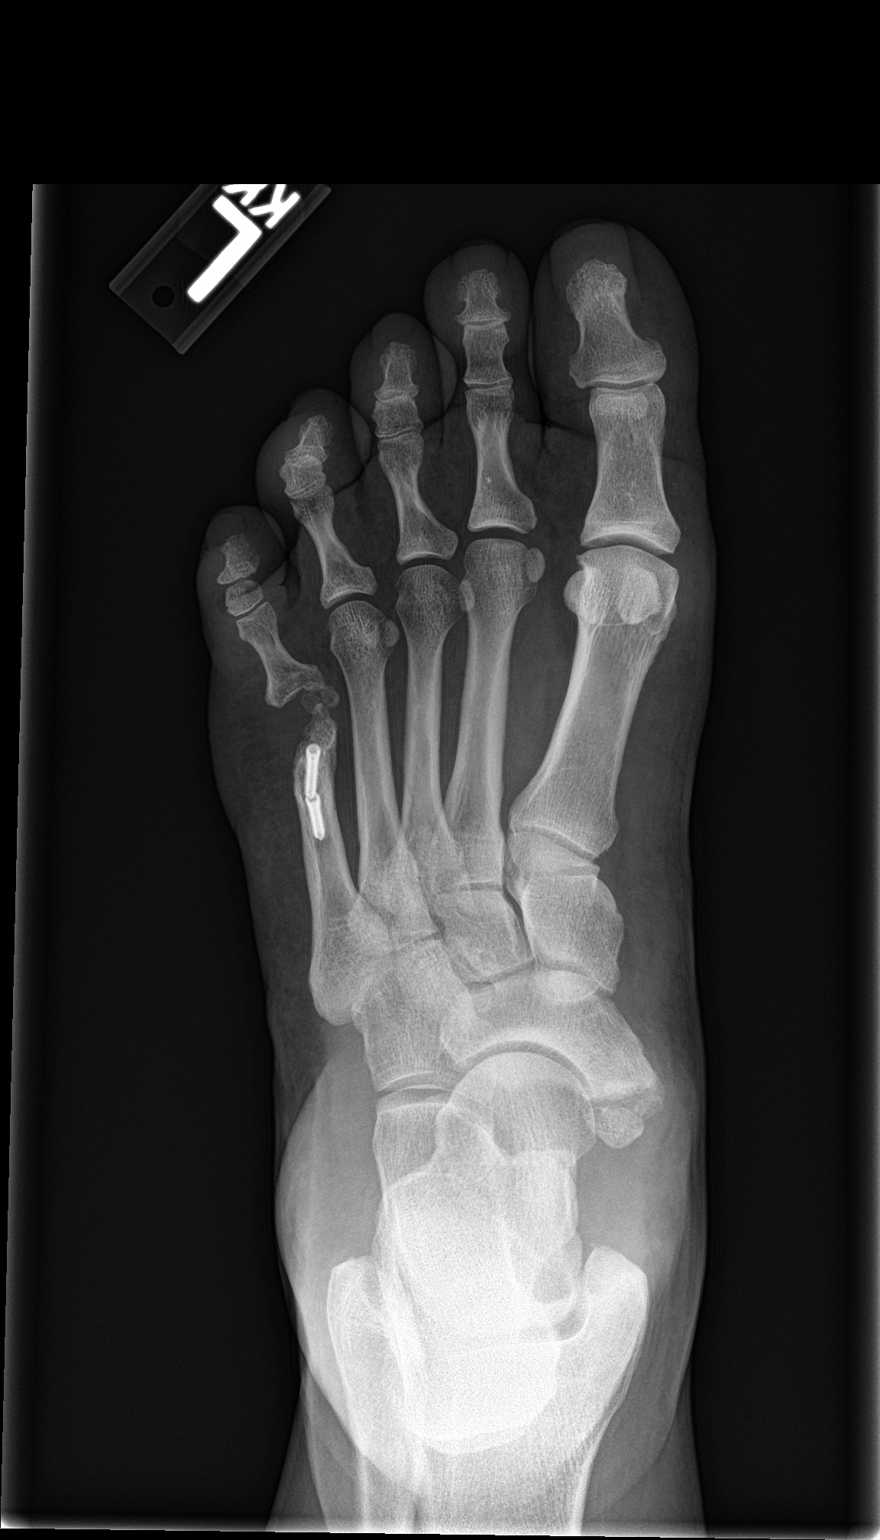

[foot obl]
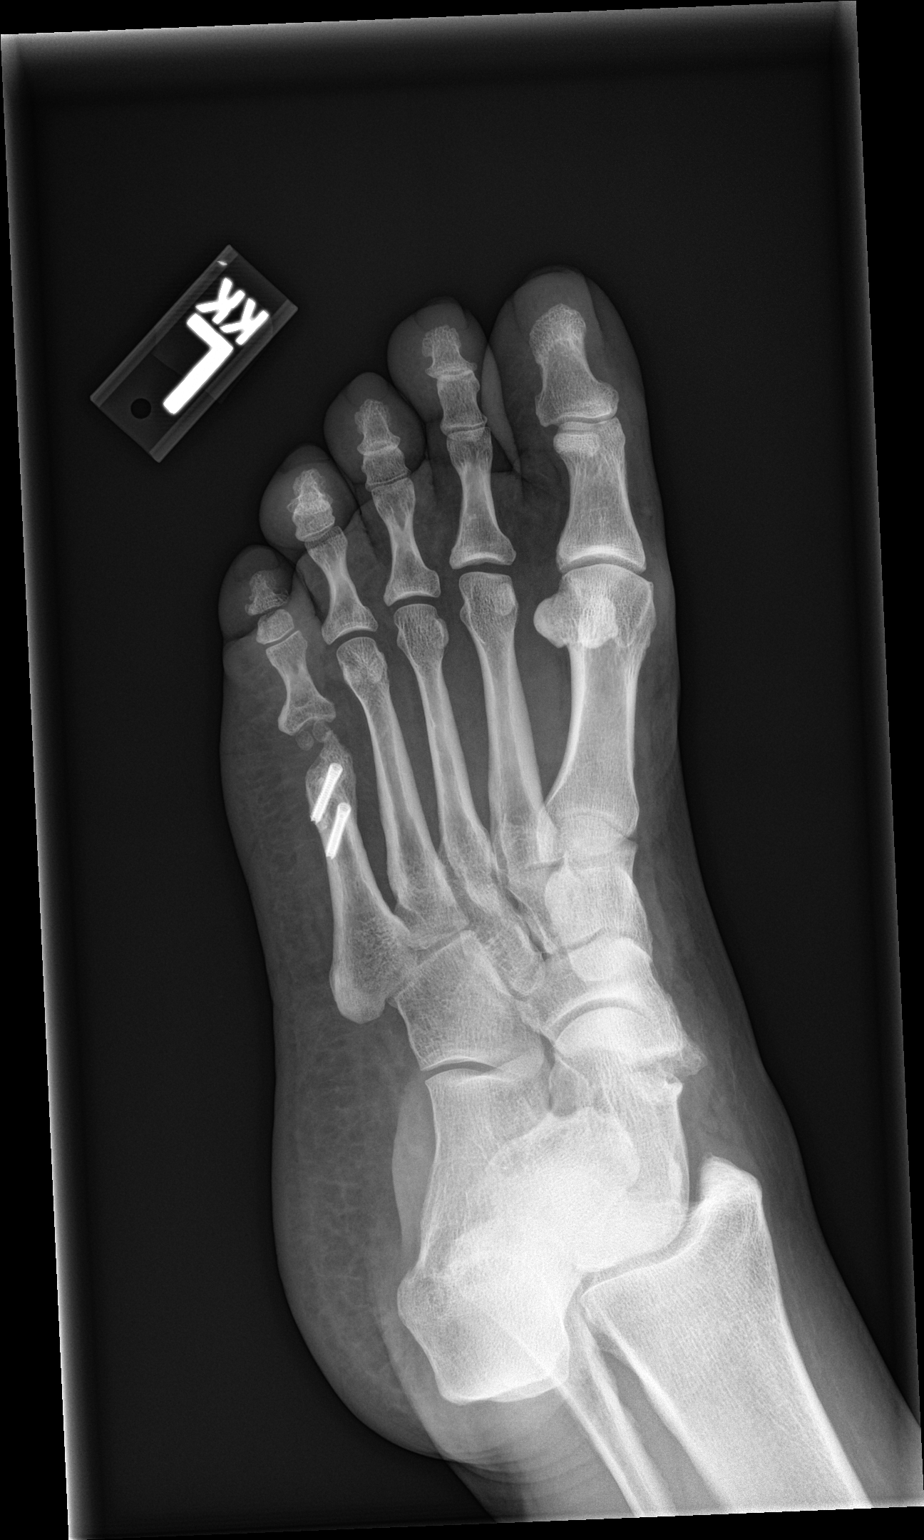

[foot lat]
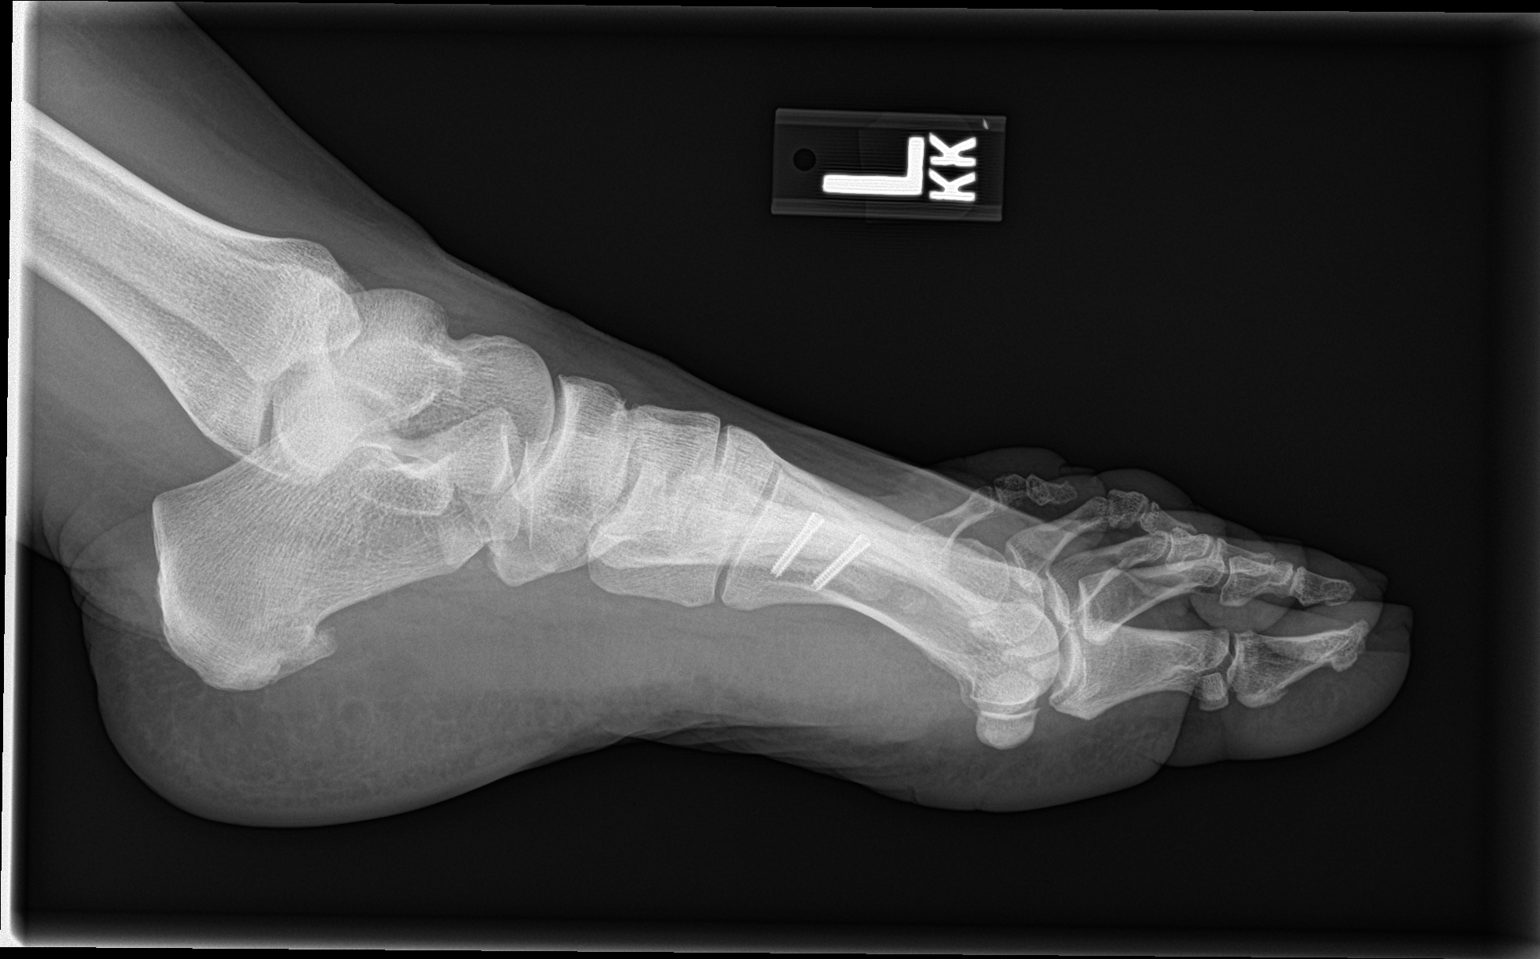

[3 of 3 positions shown; findings below may reference images not displayed]

FINDINGS: Postsurgical changes involving the distal 5th metatarsal with 2
screws in place. Mild 1st MTP joint spur formation. Os naviculare.
Moderate sized inferior calcaneal enthesophyte.
IMPRESSION: No acute abnormality. Mild 1st MTP joint degenerative changes.

## 2019-10-07 NOTE — ED Provider Notes (Signed)
[facility]    CSN: [id number] Arrival date & time: 10/07/19  1358      History   Chief Complaint Chief Complaint  Patient presents with  . Foot Pain    left    HPI [name] is a 49 y.o. female who woke up with pain on her  Mid arch foot and has continued hurting since and radiates to her lateral and medial ankle region. She does not recall injuring herself. She does go up and down steps at home. Has hx of osteoporosis. Has tried topical meds with no relief.     Past Medical History:  Diagnosis Date  . Anxiety   . Asthma   . Carpal tunnel syndrome   . Depression   . Disc degeneration, lumbosacral   . Hypertension   . Migraine   . Thyroid disease     There are no problems to display for this patient.   Past Surgical History:  Procedure Laterality Date  . CARPAL TUNNEL RELEASE     bilateral  . CARPAL TUNNEL RELEASE Right 10/20/2017   Procedure: CARPAL TUNNEL RELEASE;  Surgeon: Leanor Kail, MD;  Location: [facility] OR[facility];  Service: Orthopedics;  Laterality: Right;  . CESAREAN SECTION      OB History   No obstetric history on file.      Home Medications    Prior to Admission medications   Medication Sig Start Date End Date Taking? Authorizing Provider  albuterol (PROVENTIL HFA;VENTOLIN HFA) 108 (90 Base) MCG/ACT inhaler Inhale 2 puffs into the lungs every 8 (eight) hours as needed for wheezing or shortness of breath.   Yes [provider]  carvedilol (COREG) 6.25 MG tablet Take 6.25 mg by mouth 2 (two) times daily with a meal.   Yes [provider]  clonazePAM (KLONOPIN) 1 MG tablet Take 1 tablet by mouth daily as needed for anxiety. 07/15/17  Yes [provider]  cyclobenzaprine (FLEXERIL) 10 MG tablet TAKE 1/2 TO 1 TABLET POR V A ORAL AL ACOSTARSE CUANDO SEA NECESARIO 10/07/18  Yes [provider]  fluticasone (FLONASE) 50 MCG/ACT nasal spray Place 2 sprays into both nostrils daily. 08/18/19  Yes Melynda Ripple, MD  hydrochlorothiazide (MICROZIDE) 12.5 MG capsule Take 12.5 mg by mouth daily.   Yes [provider]  ibuprofen (ADVIL) 600 MG tablet Take 1 tablet (600 mg total) by mouth every 6 (six) hours as needed. 08/18/19  Yes Melynda Ripple, MD  levothyroxine (SYNTHROID) 112 MCG tablet Take by mouth. 03/02/18  Yes [provider]  montelukast (SINGULAIR) 10 MG tablet Take by mouth. 03/16/18  Yes [provider]  Tiotropium Bromide Monohydrate (SPIRIVA RESPIMAT) 1.25 MCG/ACT AERS Inhale into the lungs. 05/16/18  Yes [provider]  venlafaxine XR (EFFEXOR-XR) 150 MG 24 hr capsule Take 1 capsule by mouth daily. 07/15/17  Yes [provider]  terbinafine (LAMISIL) 1 % cream Apply 1 application topically 2 (two) times daily. 07/30/19   Gertie Baron, NP  gabapentin (NEURONTIN) 400 MG capsule Take 400 mg by mouth every morning.  05/25/19  [provider]    Family History Family History  Problem Relation Age of Onset  . Hypertension Mother   . Depression Mother   . Heart disease Mother   . Thyroid disease Mother   . Heart disease Father   . Heart attack Father   . Cancer Paternal Aunt   . Cancer Paternal Uncle   . Breast cancer Neg Hx  Social History Social History   Tobacco Use  . Smoking status: Never Smoker  . Smokeless tobacco: Never Used  Vaping Use  . Vaping Use: Never used  Substance Use Topics  . Alcohol use: Yes    Comment: rarely  . Drug use: No     Allergies   Morphine and related   Review of Systems Review of Systems  Musculoskeletal: Positive for arthralgias, gait problem and joint swelling.  Skin: Negative for rash and wound.  Neurological: Negative for numbness.     Physical Exam Triage Vital Signs ED Triage Vitals  Enc Vitals Group     BP 10/07/19 1424 114/81     Pulse Rate 10/07/19 1424 79     Resp 10/07/19 1424 18     Temp 10/07/19 1424 98.2 F (36.8 C)     Temp Source 10/07/19 1424  Oral     SpO2 10/07/19 1424 97 %     Weight 10/07/19 1420 257 lb 15 oz (117 kg)     Height 10/07/19 1420 5\' 1"  (1.549 m)     Head Circumference --      Peak Flow --      Pain Score 10/07/19 1420 8     Pain Loc --      Pain Edu? --      Excl. in [place]? --    No data found.  Updated Vital Signs BP 114/81 (BP Location: Right Arm)   Pulse 79   Temp 98.2 F (36.8 C) (Oral)   Resp 18   Ht 5\' 1"  (1.549 m)   Wt 257 lb 15 oz (117 kg)   LMP 05/17/2018   SpO2 97%   BMI 48.74 kg/m   Visual Acuity Right Eye Distance:   Left Eye Distance:   Bilateral Distance:    Right Eye Near:   Left Eye Near:    Bilateral Near:     Physical Exam Vitals and nursing note reviewed.  Constitutional:      General: She is not in acute distress.    Appearance: She is obese. She is not toxic-appearing.  HENT:     Head: Atraumatic.     Right Ear: External ear normal.     Left Ear: External ear normal.  Eyes:     General: No scleral icterus.    Conjunctiva/sclera: Conjunctivae normal.  Pulmonary:     Effort: Pulmonary effort is normal.  Musculoskeletal:        General: Swelling present. Normal range of motion.     Cervical back: Neck supple.     Right lower leg: No edema.     Left lower leg: No edema.     Comments: L FOOT/Ankle-  Has local tenderness of mid 1st metatarsal region, but is not on the fascia. N wounds present on feet or between her toes.  L LOWER leg- no local tenderness, but has faint pinkish coloration on lateral calf which is not hot. No calf cords or tenderness.   Skin:    General: Skin is warm and dry.     Findings: Erythema present. No rash.  Neurological:     Mental Status: She is alert and oriented to person, place, and time.     Motor: No weakness.     Gait: Gait normal.  Psychiatric:        Mood and Affect: Mood normal.        Behavior: Behavior normal.        Thought Content: Thought content normal.  Judgment: Judgment normal.    UC Treatments / Results    Labs (all labs ordered are listed, but only abnormal results are displayed) Labs Reviewed  CBC WITH DIFFERENTIAL/PLATELET - Abnormal; Notable for the following components:      Result Value   WBC 11.5 (*)    Platelets 432 (*)    Monocytes Absolute 1.2 (*)    All other components within normal limits    EKG   Radiology DG Foot Complete Left  Result Date: 10/07/2019 CLINICAL DATA:  Pain in the region of the left 1st metatarsal and left ankle the past 4 days. No known injury. EXAM: LEFT FOOT - COMPLETE 3+ VIEW COMPARISON:  None. FINDINGS: Postsurgical changes involving the distal 5th metatarsal with 2 screws in place. Mild 1st MTP joint spur formation. Os naviculare. Moderate sized inferior calcaneal enthesophyte. IMPRESSION: No acute abnormality. Mild 1st MTP joint degenerative changes. Electronically Signed   By: Claudie Revering M.D.   On: 10/07/2019 15:16    Procedures Procedures (including critical care time)  Medications Ordered in UC Medications - No data to display  Initial Impression / Assessment and Plan / UC Course  I have reviewed the triage vital signs and the nursing notes. She was advised to take her NSAID's she has at home for arthritis. Needs to avoid walking with no shoes at home or wearing sandals. Needs to wear good support tennis shoes.  Needs to Fu with PCP Pertinent labs & imaging results that were available during my care of the patient were reviewed by me and considered in my medical decision making (see chart for details).  Final Clinical Impressions(s) / UC Diagnoses   Final diagnoses:  Foot pain, left  Bone spur     Discharge Instructions     Your foot xray is showing you have a large heel bone spurr and its is inflamed possibly causing the pain.   What causes bone spurs on the foot A bone spur on top of the foot is sometimes due to osteoarthritis, a type of arthritis. With this condition, cartilage between bones can deteriorate over time. To  compensate for missing cartilage, the body produces extra growths of bones called bone spurs.    ED Prescriptions    None     PDMP not reviewed this encounter.   Shelby Mattocks, PA-C 10/07/19 1550

## 2019-10-07 NOTE — Discharge Instructions (Signed)
Your foot xray is showing you have a large heel bone spurr and its is inflamed possibly causing the pain.   What causes bone spurs on the foot A bone spur on top of the foot is sometimes due to osteoarthritis, a type of arthritis. With this condition, cartilage between bones can deteriorate over time. To compensate for missing cartilage, the body produces extra growths of bones called bone spurs.

## 2019-10-07 NOTE — ED Triage Notes (Signed)
Pt c/o left ankle and foot pain. Started about 4 days ago. No known injury. Weight baring makes pain worse. She has tried topical pain relief and did not help.

## 2019-11-08 DIAGNOSIS — Z9884 Bariatric surgery status: Secondary | ICD-10-CM | POA: Insufficient documentation

## 2019-11-20 ENCOUNTER — Ambulatory Visit
Admission: EM | Admit: 2019-11-20 | Discharge: 2019-11-20 | Disposition: A | Discharge status: Home / Self Care | Payer: Medicare HMO

## 2019-11-20 ENCOUNTER — Other Ambulatory Visit: Payer: Self-pay

## 2019-11-20 DIAGNOSIS — R1111 Vomiting without nausea: Secondary | ICD-10-CM | POA: Diagnosis not present

## 2019-11-20 NOTE — ED Provider Notes (Signed)
[facility]    CSN: [id number] Arrival date & time: 11/20/19  1811      History   Chief Complaint Chief Complaint  Patient presents with   Emesis    HPI [name] is a 49 y.o. female.   74-year-old female here for evaluation of vomiting and pinching sensation in her throat.  Patient had a sleeve gastrectomy at [facility] on 10/23/2019.  She had her postoperative appointment last week on 11/09/2019.  The day after her postop visit she reports that she was starting to eat soft foods and whenever she would eat she would feel the food go down and stop behind her breastbone.  After 20 minutes or so she would get up and go to the bathroom and leaned over and without force the food would just come back up from her esophagus.  She reports that she is able to drink water and protein shakes but the protein shakes produce a lot of gas.  She has been eating mashed potatoes, soups, and some chicken.  She denies any possibility of bones being in the chicken she ate.  She reports a tender tugging sensation on the right side of her neck and a sharp pinching sensation over the front of her esophagus near her clavicle.  She denies seeing bile or blood in the food that comes back up.  She has not had any shortness of breath or cough.  No abdominal pain.  She has had a lot of belching.  She reports that she has to sleep sitting up on 4 pillows due to the pain caused from the pinching sensation in her neck.  This is despite her taking Ambien, clonazepam, and melatonin.     Past Medical History:  Diagnosis Date   Anxiety    Asthma    Carpal tunnel syndrome    Depression    Disc degeneration, lumbosacral    Hypertension    Migraine    Thyroid disease     There are no problems to display for this patient.   Past Surgical History:  Procedure Laterality Date   CARPAL TUNNEL RELEASE     bilateral   CARPAL TUNNEL RELEASE Right 10/20/2017   Procedure: CARPAL TUNNEL  RELEASE;  Surgeon: Leanor Kail, MD;  Location: [facility] ORS;  Service: Orthopedics;  Laterality: Right;   CESAREAN SECTION      OB History   No obstetric history on file.      Home Medications    Prior to Admission medications   Medication Sig Start Date End Date Taking? Authorizing Provider  albuterol (PROVENTIL HFA;VENTOLIN HFA) 108 (90 Base) MCG/ACT inhaler Inhale 2 puffs into the lungs every 8 (eight) hours as needed for wheezing or shortness of breath.   Yes [provider]  carvedilol (COREG) 6.25 MG tablet Take 6.25 mg by mouth 2 (two) times daily with a meal.   Yes [provider]  clonazePAM (KLONOPIN) 1 MG tablet Take 1 tablet by mouth daily as needed for anxiety. 07/15/17  Yes [provider]  cyclobenzaprine (FLEXERIL) 10 MG tablet TAKE 1/2 TO 1 TABLET POR V A ORAL AL ACOSTARSE CUANDO SEA NECESARIO 10/07/18  Yes [provider]  fluticasone (FLONASE) 50 MCG/ACT nasal spray Place 2 sprays into both nostrils daily. 08/18/19  Yes Melynda Ripple, MD  hydrochlorothiazide (MICROZIDE) 12.5 MG capsule Take 12.5 mg by mouth daily.   Yes [provider]  ibuprofen (ADVIL) 600 MG tablet Take 1 tablet (600 mg total) by  mouth every 6 (six) hours as needed. 08/18/19  Yes Melynda Ripple, MD  levothyroxine (SYNTHROID) 112 MCG tablet Take by mouth. 03/02/18  Yes [provider]  montelukast (SINGULAIR) 10 MG tablet Take by mouth. 03/16/18  Yes [provider]  terbinafine (LAMISIL) 1 % cream Apply 1 application topically 2 (two) times daily. 07/30/19  Yes Gertie Baron, NP  Tiotropium Bromide Monohydrate (SPIRIVA RESPIMAT) 1.25 MCG/ACT AERS Inhale into the lungs. 05/16/18  Yes [provider]  venlafaxine XR (EFFEXOR-XR) 150 MG 24 hr capsule Take 1 capsule by mouth daily. 07/15/17  Yes [provider]  gabapentin (NEURONTIN) 400 MG capsule Take 400 mg by mouth every morning.  05/25/19  [provider]     Family History Family History  Problem Relation Age of Onset   Hypertension Mother    Depression Mother    Heart disease Mother    Thyroid disease Mother    Heart disease Father    Heart attack Father    Cancer Paternal Aunt    Cancer Paternal Uncle    Breast cancer Neg Hx     Social History Social History   Tobacco Use   Smoking status: Never Smoker   Smokeless tobacco: Never Used  Scientific laboratory technician Use: Never used  Substance Use Topics   Alcohol use: Yes    Comment: rarely   Drug use: No     Allergies   Morphine and related   Review of Systems Review of Systems  Constitutional: Negative for activity change, appetite change and fever.  HENT: Positive for sore throat. Negative for congestion and trouble swallowing.        Pinching sensation in the front part of her neck and a sharper tugging sensation on the right side of her neck.  Respiratory: Negative for cough, choking, shortness of breath and wheezing.   Cardiovascular: Negative for chest pain.  Gastrointestinal: Negative for abdominal pain, diarrhea, nausea and vomiting.       Patient reports that food feels like it gets stuck behind her breastbone and never makes it to her stomach.  Genitourinary: Negative for dysuria and frequency.  Musculoskeletal: Negative for arthralgias, back pain and myalgias.  Skin: Positive for wound. Negative for rash.       Patient has surgical sites on both sides of her upper abdomen.  Neurological: Negative for dizziness and numbness.  Hematological: Negative.   Psychiatric/Behavioral: Negative.      Physical Exam Triage Vital Signs ED Triage Vitals  Enc Vitals Group     BP 11/20/19 1837 135/88     Pulse Rate 11/20/19 1837 83     Resp 11/20/19 1837 16     Temp 11/20/19 1837 98.6 F (37 C)     Temp Source 11/20/19 1837 Oral     SpO2 11/20/19 1837 97 %     Weight 11/20/19 1835 237 lb (107.5 kg)     Height 11/20/19 1835 5\' 1"  (1.549 m)     Head  Circumference --      Peak Flow --      Pain Score 11/20/19 1834 7     Pain Loc --      Pain Edu? --      Excl. in [place]? --    No data found.  Updated Vital Signs BP 135/88 (BP Location: Right Arm)    Pulse 83    Temp 98.6 F (37 C) (Oral)    Resp 16    Ht 5\' 1"  (  1.549 m)    Wt 237 lb (107.5 kg)    LMP 05/17/2018    SpO2 97%    BMI 44.78 kg/m   Visual Acuity Right Eye Distance:   Left Eye Distance:   Bilateral Distance:    Right Eye Near:   Left Eye Near:    Bilateral Near:     Physical Exam Vitals and nursing note reviewed.  Constitutional:      General: She is in acute distress.     Appearance: Normal appearance.     Comments: Patient appears anxious and in pain.  HENT:     Head: Normocephalic and atraumatic.     Mouth/Throat:     Mouth: Mucous membranes are moist.     Pharynx: Oropharynx is clear. Posterior oropharyngeal erythema present.     Comments: Posterior oropharynx is quite beefy red.  There is no exudate but there is edema. Eyes:     General: No scleral icterus.    Extraocular Movements: Extraocular movements intact.     Conjunctiva/sclera: Conjunctivae normal.     Pupils: Pupils are equal, round, and reactive to light.  Neck:     Comments: No cervical lymph adenopathy.  There is marked tenderness on the right side of the neck and in the sternal notch. Cardiovascular:     Rate and Rhythm: Normal rate and regular rhythm.     Pulses: Normal pulses.     Heart sounds: Murmur heard.  No gallop.   Pulmonary:     Effort: Pulmonary effort is normal.     Breath sounds: Normal breath sounds. No wheezing, rhonchi or rales.  Abdominal:     General: Bowel sounds are normal.     Tenderness: There is no abdominal tenderness.  Musculoskeletal:        General: No swelling or tenderness. Normal range of motion.     Cervical back: Normal range of motion and neck supple.  Lymphadenopathy:     Cervical: No cervical adenopathy.  Skin:    General: Skin is warm and dry.       Capillary Refill: Capillary refill takes less than 2 seconds.     Findings: No bruising or erythema.  Neurological:     General: No focal deficit present.     Mental Status: She is alert and oriented to person, place, and time.  Psychiatric:        Mood and Affect: Mood normal.        Behavior: Behavior normal.        Thought Content: Thought content normal.        Judgment: Judgment normal.      UC Treatments / Results  Labs (all labs ordered are listed, but only abnormal results are displayed) Labs Reviewed - No data to display  EKG   Radiology No results found.  Procedures Procedures (including critical care time)  Medications Ordered in UC Medications - No data to display  Initial Impression / Assessment and Plan / UC Course  I have reviewed the triage vital signs and the nursing notes.  Pertinent labs & imaging results that were available during my care of the patient were reviewed by me and considered in my medical decision making (see chart for details).   Patient is 4 weeks status post sleeve gastrectomy.  She presents for evaluation of right-sided and anterior neck pain and an inability to swallow food.  She reports that 20 minutes after she eats she feels the food still stuck behind her breastbone  and when she leans over to vomit the food just falls out.  Patient is not having any respiratory distress and lung sounds are clear.  I am unable to fully evaluate her condition in the setting.  I am referring her to [facility] for further evaluation by her bariatric surgeon or gastroenterology.  Patient needs adamantinoma flexible scope to evaluate her lower pharynx and possibly an endoscopy to evaluate for stricture of the esophagus.   Final Clinical Impressions(s) / UC Diagnoses   Final diagnoses:  Vomiting without nausea, intractability of vomiting not specified, unspecified vomiting type     Discharge Instructions     I am recommending that you go  to the emergency department at [facility] given that your surgery was performed at that hospital.  We are unable to fully evaluate your condition in the urgent care.  You need to be evaluated by your bariatric surgeon and possibly gastroenterology.  You may need an endoscopy to figure out what is going on.    ED Prescriptions    None     PDMP not reviewed this encounter.   Margarette Canada, NP 11/20/19 1914

## 2019-11-20 NOTE — Discharge Instructions (Addendum)
I am recommending that you go to the emergency department at [facility] given that your surgery was performed at that hospital.  We are unable to fully evaluate your condition in the urgent care.  You need to be evaluated by your bariatric surgeon and possibly gastroenterology.  You may need an endoscopy to figure out what is going on.

## 2019-11-20 NOTE — ED Triage Notes (Signed)
Patient states that she had bariatric surgery on 10/23/2019. Patient states that she had the sleeve procedure. Patient states that she has been vomiting since Wednesday. States that now her throat is irritated from this. Patient states that she sometimes feels like a "bone is sticking in her throat". Patient states that she called her surgeon and pcp and no one could see her.

## 2020-04-26 ENCOUNTER — Other Ambulatory Visit: Payer: Self-pay | Admitting: Podiatry

## 2020-04-26 DIAGNOSIS — M76822 Posterior tibial tendinitis, left leg: Secondary | ICD-10-CM

## 2020-05-06 ENCOUNTER — Ambulatory Visit
Admission: RE | Admit: 2020-05-06 | Discharge: 2020-05-06 | Disposition: A | Discharge status: Home / Self Care | Payer: Medicare Other | Source: Ambulatory Visit | Attending: Podiatry | Admitting: Podiatry

## 2020-05-06 ENCOUNTER — Other Ambulatory Visit: Payer: Self-pay

## 2020-05-06 DIAGNOSIS — M76822 Posterior tibial tendinitis, left leg: Secondary | ICD-10-CM | POA: Diagnosis not present

## 2020-05-06 IMAGING — MR MR ANKLE*L* W/O CM
5 series · 40 of 40 positions shown · non-contrast
Comparison: None.

CLINICAL DATA: Left medial ankle pain for 7 months to 1 year

EXAM:
MRI OF THE LEFT ANKLE WITHOUT CONTRAST
TECHNIQUE: Multiplanar, multisequence MR imaging of the ankle was performed. No
intravenous contrast was administered.

[Series 4: STIR · sagittal · left · 4.0mm · 0.35mm/px · 6 of 21 slices shown]
[im 1/21]
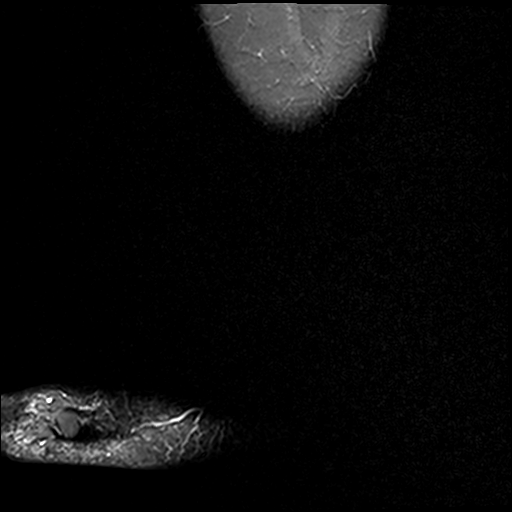
[im 5/21]
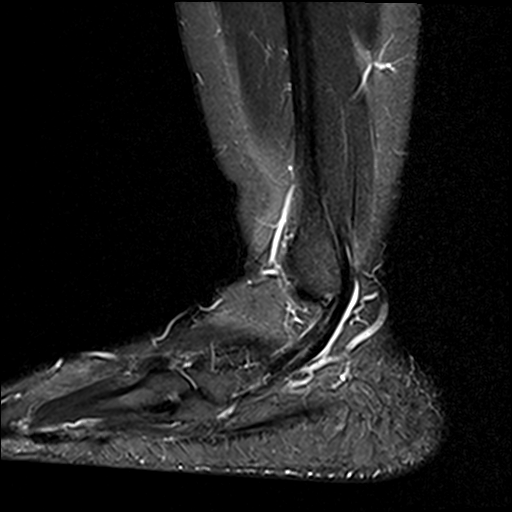
[im 9/21]
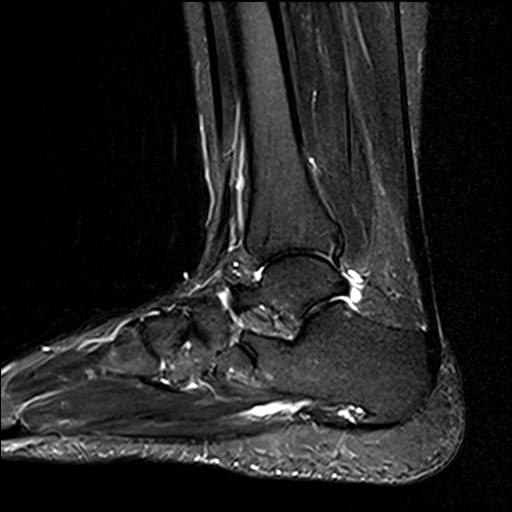
[im 13/21]
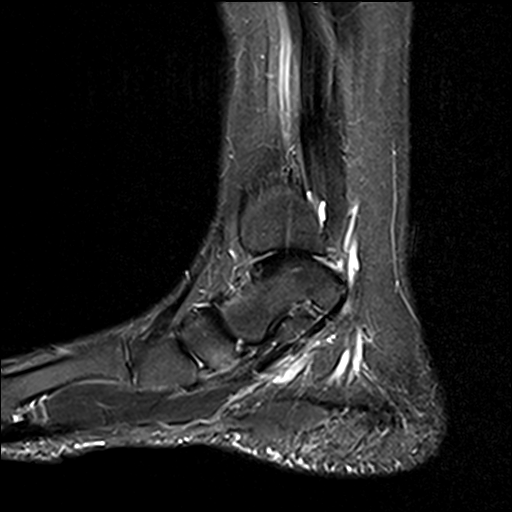
[im 17/21]
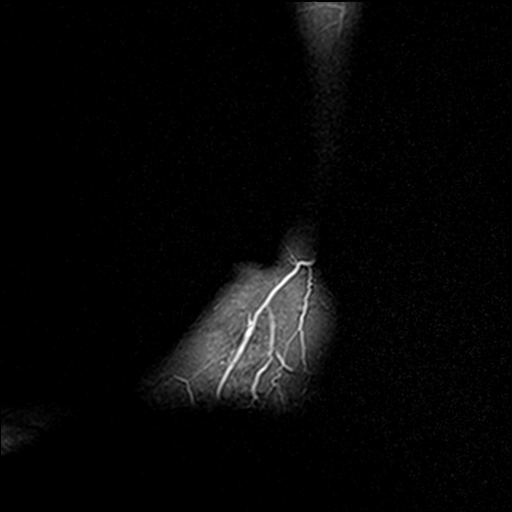
[im 21/21]
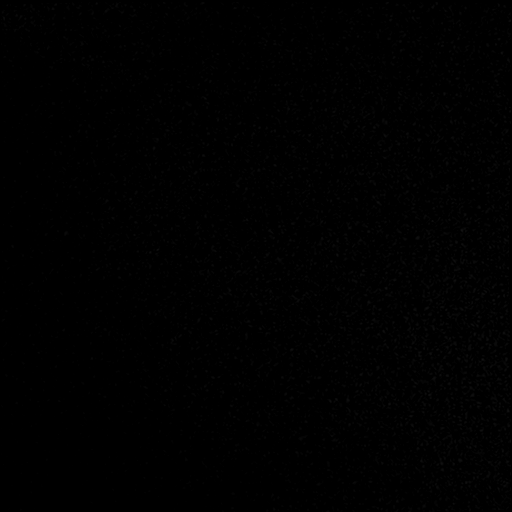

[Series 5: PD fat-sat · axial · left · 3.0mm · 0.50mm/px · z∈[-99,+40]mm · 10 of 36 slices shown]
[im 1/36]
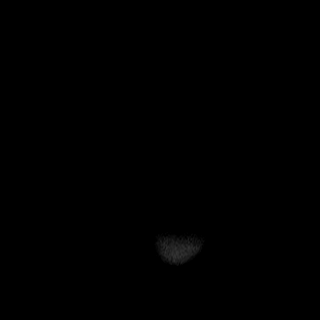
[im 4/36]
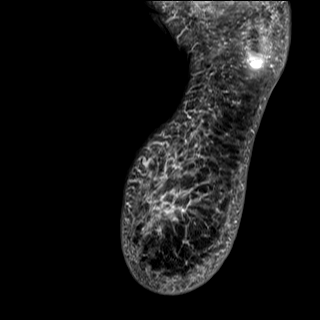
[im 8/36]
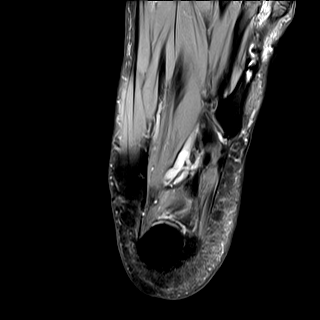
[im 12/36]
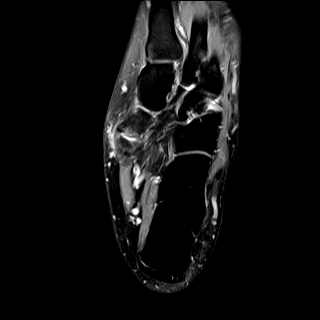
[im 16/36]
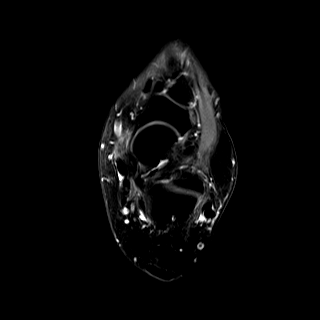
[im 20/36]
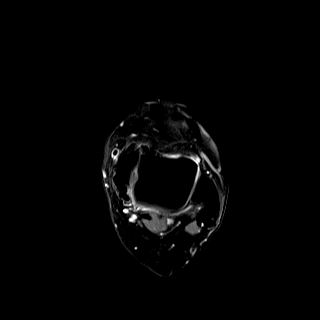
[im 24/36]
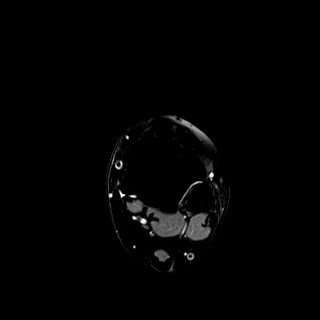
[im 28/36]
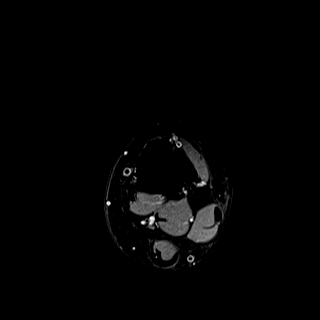
[im 32/36]
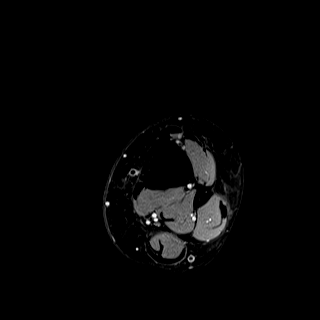
[im 36/36]
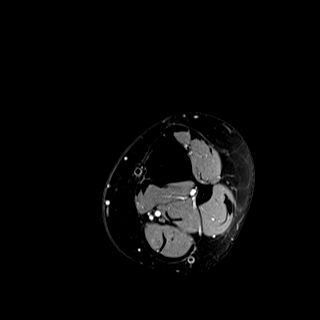

[Series 7: T2 · coronal · left · 3.0mm · 0.62mm/px · 10 of 40 slices shown]
[im 1/40]
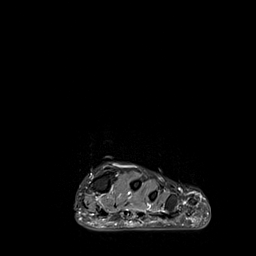
[im 5/40]
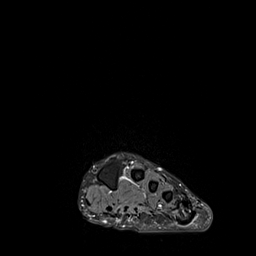
[im 9/40]
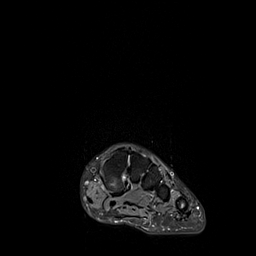
[im 14/40]
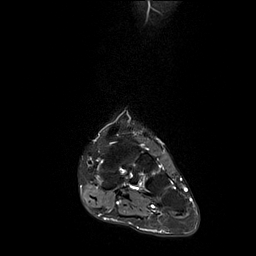
[im 18/40]
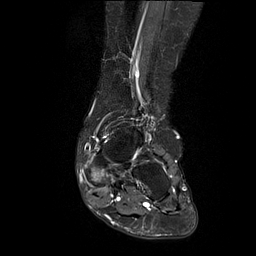
[im 22/40]
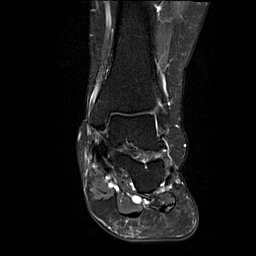
[im 27/40]
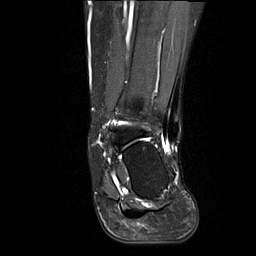
[im 31/40]
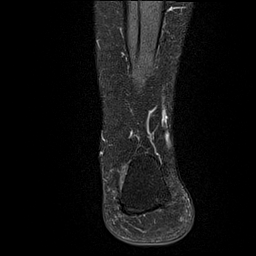
[im 35/40]
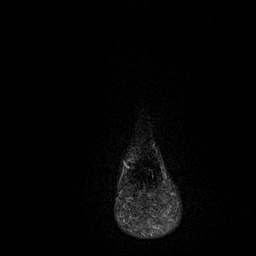
[im 40/40]
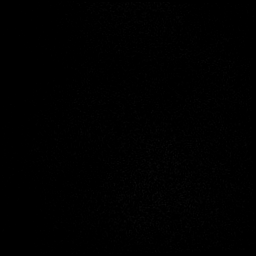

[Series 8: T1 · sagittal · left · 4.0mm · 0.70mm/px · 5 of 21 slices shown]
[im 1/21]
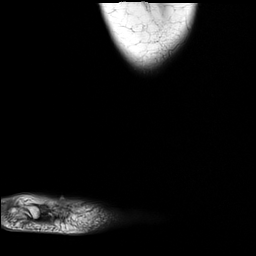
[im 6/21]
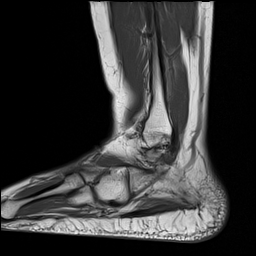
[im 11/21]
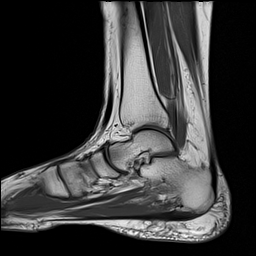
[im 16/21]
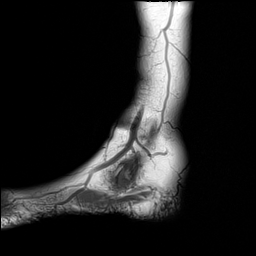
[im 21/21]
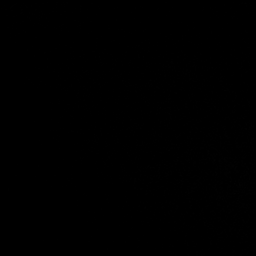

[Series 9: T2 fat-sat · axial · left · 3.0mm · 0.50mm/px · z∈[-99,+40]mm · 9 of 36 slices shown]
[im 1/36]
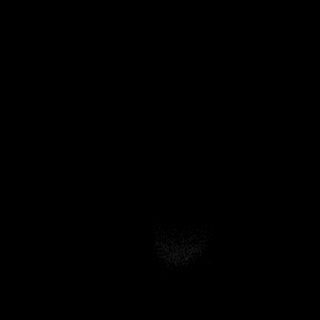
[im 5/36]
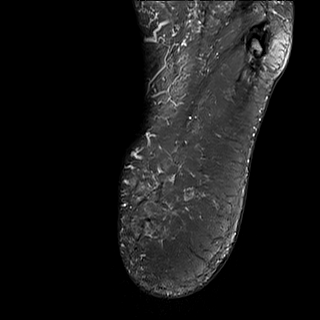
[im 9/36]
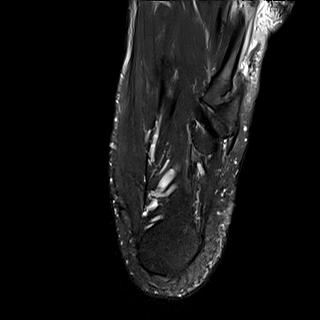
[im 14/36]
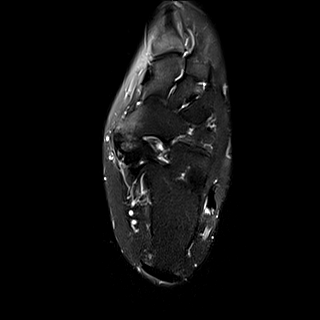
[im 18/36]
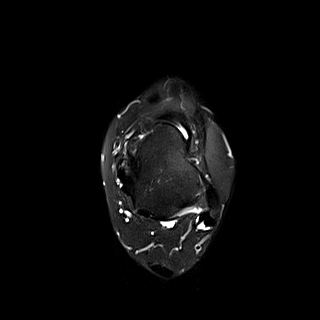
[im 22/36]
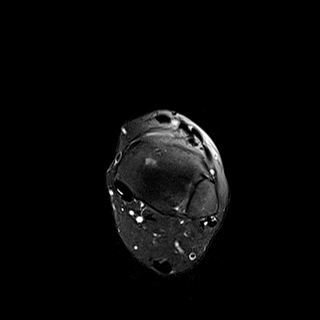
[im 27/36]
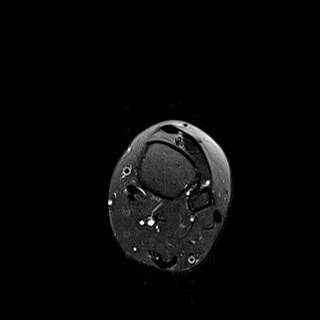
[im 31/36]
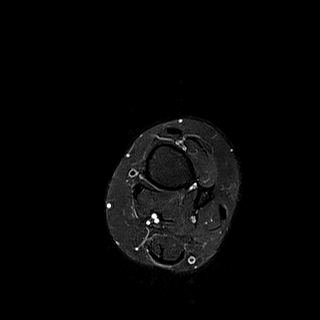
[im 36/36]
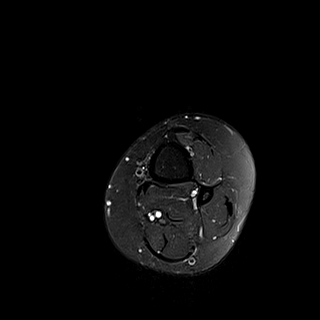

[40 of 40 positions shown; findings below may reference images not displayed]

FINDINGS: TENDONS

Peroneal: Peroneal longus tendon intact. Peroneal brevis intact.

Posteromedial: There is a longitudinal split tear of the posterior
tibialis tendon at the level of the hindfoot. The there is an os
navicular is with increased signal of the insertion site. Fluid is
seen surrounding the posterior tibialis tendon throughout. Flexor
hallucis longus tendon intact. Flexor digitorum longus tendon
intact.

Anterior: Tibialis anterior tendon intact. Extensor hallucis longus
tendon intact Extensor digitorum longus tendon intact.

Achilles:  Intact.

Plantar Fascia: Chronic thickening of the middle cord of the plantar
fascia with an adjacent calcaneal enthesophyte is seen.

LIGAMENTS

Lateral: Anterior talofibular ligament intact. Calcaneofibular
ligament intact. Posterior talofibular ligament intact. Anterior and
posterior tibiofibular ligaments intact.

Medial: Deltoid ligament intact. Spring ligament intact.

CARTILAGE

Ankle Joint: No joint effusion. Normal ankle mortise. No chondral
defect.

Subtalar Joints/Sinus Tarsi: Normal subtalar joints. No subtalar
joint effusion. Normal sinus tarsi.

Bones: An os navicular is present with increased signal and fluid
within the syndesmosis with adjacent subchondral cystic changes. No
fracture is identified.

Soft Tissue: Mild soft tissue edema seen along the a new medial
aspect of the midfoot.
IMPRESSION: Longitudinal split tear of the posterior tibialis tendon at the
level of the insertion site with posterior tibialis tenosynovitis
and findings suggestive of os navicularis syndrome

Chronic middle cord plantar fasciitis

## 2020-06-19 ENCOUNTER — Other Ambulatory Visit: Payer: Self-pay | Admitting: Podiatry

## 2020-07-04 ENCOUNTER — Inpatient Hospital Stay: Admission: RE | Admit: 2020-07-04 | Payer: Medicare Other | Source: Ambulatory Visit

## 2020-07-05 ENCOUNTER — Other Ambulatory Visit: Payer: Self-pay

## 2020-07-05 ENCOUNTER — Encounter
Admission: RE | Admit: 2020-07-05 | Discharge: 2020-07-05 | Disposition: A | Discharge status: Home / Self Care | Payer: Medicare Other | Source: Ambulatory Visit | Attending: Podiatry | Admitting: Podiatry

## 2020-07-05 HISTORY — DX: Prediabetes: R73.03

## 2020-07-05 HISTORY — DX: Hypothyroidism, unspecified: E03.9

## 2020-07-05 HISTORY — DX: Gastro-esophageal reflux disease without esophagitis: K21.9

## 2020-07-05 HISTORY — DX: Sleep apnea, unspecified: G47.30

## 2020-07-05 NOTE — Patient Instructions (Signed)
INSTRUCTIONS FOR SURGERY     Your surgery is scheduled for:   Friday, June 10TH     To find out your arrival time for the day of surgery,          please call [phone] between 1 pm and 3 pm on : Thursday, June 9TH     When you arrive for surgery, report to the [facility]. ONCE THEY HAVE FINISHED THEIR PROCESS, YOU WILL GO TO THE SECOND FLOOR AND SIGN IN AT THE SURGERY DESK.    REMEMBER: Instructions that are not followed completely may result in serious medical risk,  up to and including death, or upon the discretion of your surgeon and anesthesiologist,            your surgery may need to be rescheduled.  __X__ 1. Do not eat food after midnight the night before your procedure.                    No gum, candy, lozenger, tic tacs, tums or hard candies.                  ABSOLUTELY NOTHING SOLID IN YOUR MOUTH AFTER MIDNIGHT                    You may drink unlimited clear liquids up to 2 hours before you are scheduled to arrive for surgery.                   Do not drink anything within those 2 hours unless you need to take medicine, then take the                   smallest amount you need.  Clear liquids include:  water, apple juice without pulp,                   any flavor Gatorade, Black coffee, black tea.  Sugar may be added but no dairy/ honey /lemon.                        Broth and jello is not considered a clear liquid.  __x__  2. On the morning of surgery, please brush your teeth with toothpaste and water. You may rinse with                  mouthwash if you wish but DO NOT SWALLOW TOOTHPASTE OR MOUTHWASH  __X___3. NO alcohol for 24 hours before or after surgery.  __x___ 4.  Do NOT smoke or use e-cigarettes for 24 HOURS PRIOR TO SURGERY.                      DO NOT Use any chewable tobacco products for at least 6 hours prior to surgery.  __x___ 5. If you start any new  medication after this appointment and prior to surgery, please                   Bring it with you on the day of surgery.  ___x__ 6. Notify  your doctor if there is any change in your medical condition, such as fever,                   infection, vomitting, diarrhea or any open sores.  __x___ 7.  USE the CHG SOAP as instructed, the night before surgery and the day of surgery.                   Once you have washed with this soap, do NOT use any of the following: Powders, perfumes                    or lotions. Please do not wear make up, hairpins, clips or nail polish. You MAY wear deodorant.                                                  Women need to shave 48 hours prior to surgery.                   DO NOT wear ANY jewelry on the day of surgery. If there are rings that are too tight to                    remove easily, please address this prior to the surgery day. Piercings need to be removed.                                                                     NO METAL ON YOUR BODY.                    Do NOT bring any valuables.  If you came to Pre-Admit testing then you will not need license,                     insurance card or credit card.  If you will be staying overnight, please either leave your things in                     the car or have your family be responsible for these items.                     [facility] IS NOT RESPONSIBLE FOR BELONGINGS OR VALUABLES.  ___X__ 8. DO NOT wear contact lenses on surgery day.  You may not have dentures,                     Hearing aides, contacts or glasses in the operating room. These items can be                    Placed in the Recovery Room to receive immediately after surgery.  __x___ 9. IF YOU ARE SCHEDULED TO GO HOME ON THE SAME DAY, YOU MUST                   Have someone to drive you home and to stay with you  for the first 24 hours.  Have an arrangement prior to arriving on surgery day.  ___x__ 10. Take the  following medications on the morning of surgery with a sip of water:                              1. SYMBICORT INHALER                     2. CARVEDILOL                     3. SYNTHROID                     4. CLARITIN                     5. EFFEXOR                     6. PRILOSEC  __X___ 11.  Follow any instructions provided to you by your surgeon.                        Such as enema, clear liquid bowel prep                   PLEASE DRINK THE PRESURGICAL CARBOHYDRATE DRINK ON THE              MORNING OF SURGERY. HAVE IT COMPLETED BEFORE LEAVING HOME.  __X__  12. STOP ALL ASPIRIN PRODUCTS AS OF TODAY, June 3RD                       THIS INCLUDES BC POWDERS / GOODIES POWDER  __x___ 13. STOP Anti-inflammatories as of TODAY, June 3RD                      This includes IBUPROFEN / MOTRIN / ADVIL / ALEVE/ NAPROXYN                    YOU MAY TAKE TYLENOL ANY TIME PRIOR TO SURGERY.  __X___ 14.  Stop supplements until after surgery.                     This includes: OSCAL // VITAMIN 3 // VITAMIN B12 // MULTIVITAMINS // ALL                        OVER THE COUNTER MEDICINES.                  _____ 48. Bring your CPAP machine into preop with you on the morning of surgery.  __X___16.  Stop Metformin 2 full days prior to surgery.  LAST DOSE ON June 7TH.                     DO NOT TAKE ANY DIABETIC MEDICINE ON THE MORNING OF SURGERY.                       ____X__17.  Continue to take the following medications but do not take on the morning of surgery:                         HYDROCHLOROTHIAZIDE  ___X___18. Wear clean and comfortable clothing to the hospital.  WEAR LOOSE FITTING PANTS/SHORTS SO  YOU CAN GET YOUR BANDAGED FOOT INSIDE.  BRING PHONE NUMBERS FOR YOUR CONTACTS (driver especially)  Have stool softeners at home to use after surgery when taking narcotic pain medication.

## 2020-07-09 ENCOUNTER — Ambulatory Visit: Payer: Medicare Other | Admitting: Urgent Care

## 2020-07-09 ENCOUNTER — Other Ambulatory Visit: Payer: Self-pay

## 2020-07-09 ENCOUNTER — Other Ambulatory Visit
Admission: RE | Admit: 2020-07-09 | Discharge: 2020-07-09 | Disposition: A | Discharge status: Home / Self Care | Payer: Medicare Other | Source: Ambulatory Visit | Attending: Podiatry | Admitting: Podiatry

## 2020-07-09 DIAGNOSIS — I10 Essential (primary) hypertension: Secondary | ICD-10-CM | POA: Diagnosis not present

## 2020-07-09 DIAGNOSIS — Z01818 Encounter for other preprocedural examination: Secondary | ICD-10-CM | POA: Diagnosis present

## 2020-07-09 LAB — BASIC METABOLIC PANEL
Anion gap: 8 (ref 5–15)
BUN: 13 mg/dL (ref 6–20)
CO2: 28 mmol/L (ref 22–32)
Calcium: 9.3 mg/dL (ref 8.9–10.3)
Chloride: 101 mmol/L (ref 98–111)
Creatinine, Ser: 0.66 mg/dL (ref 0.44–1.00)
GFR, Estimated: >60 mL/min (ref >60)
Glucose, Bld: 105 mg/dL — ABNORMAL HIGH (ref 70–99)
Potassium: 2.6 mmol/L — CL (ref 3.5–5.1)
Sodium: 137 mmol/L (ref 135–145)

## 2020-07-09 LAB — CBC
HCT: 41.8 % (ref 36.0–46.0)
Hemoglobin: 14.9 g/dL (ref 12.0–15.0)
MCH: 33.1 pg (ref 26.0–34.0)
MCHC: 35.6 g/dL (ref 30.0–36.0)
MCV: 92.9 fL (ref 80.0–100.0)
Platelets: 399 10*3/uL (ref 150–400)
RBC: 4.5 MIL/uL (ref 3.87–5.11)
RDW: 12.4 % (ref 11.5–15.5)
WBC: 7.2 10*3/uL (ref 4.0–10.5)
nRBC: 0 % (ref 0.0–0.2)

## 2020-07-09 NOTE — Progress Notes (Signed)
  [facility] Perioperative Services: Pre-Admission/Anesthesia Testing  Abnormal Lab Notification   Date: 07/09/20  Name: [name] MRN:   [id number]  Re: Abnormal labs noted during PAT appointment   Provider(s) Notified: [name], DPM Notification mode: Routed and/or faxed via [facility]   ABNORMAL LAB VALUE(S): Lab Results  Component Value Date   K 2.6 (LL) 07/09/2020   Notes:  Patient is scheduled for surgery on 07/12/2020. K+ noted to be low as above. In review of patient's medication reconciliation, it is noted that she is on daily thiazide diuretic (HCTZ 25 mg), which is likely the underlying cause of the noted hypokalemia.  Additionally, patient uses albuterol MDI which could result in shifting of K+ into the intracellular space, thus reducing serum K+ levels.  Will forward to primary attending surgeon for review and optimization.  Order placed to have K+ rechecked on the day of her procedure to ensure preoperative optimization.    This is a Community education officer; no formal response is required.  Honor Loh, MSN, APRN, FNP-C, CEN [facility]  Peri-operative Services Nurse Practitioner Phone: [phone] Fax: [phone] 07/09/20 3:14 PM

## 2020-07-11 MED ORDER — CHLORHEXIDINE GLUCONATE 0.12 % MT SOLN: 15.0000 mL | Freq: Once | OROMUCOSAL | Status: AC

## 2020-07-11 MED ORDER — ORAL CARE MOUTH RINSE: 15.0000 mL | Freq: Once | OROMUCOSAL | Status: AC

## 2020-07-11 MED ORDER — SODIUM CHLORIDE 0.9 % IV SOLN: INTRAVENOUS | Status: DC

## 2020-07-11 MED ORDER — CEFAZOLIN SODIUM-DEXTROSE 2-4 GM/100ML-% IV SOLN: 2.0000 g | INTRAVENOUS | Status: DC

## 2020-07-11 MED ORDER — POVIDONE-IODINE 7.5 % EX SOLN
Freq: Once | CUTANEOUS | Status: DC
  Filled 2020-07-11: qty 118

## 2020-07-12 ENCOUNTER — Ambulatory Visit: Admit: 2020-07-12 | Payer: Self-pay | Admitting: Podiatry

## 2020-07-12 ENCOUNTER — Encounter: Payer: Self-pay | Admitting: Podiatry

## 2020-07-12 ENCOUNTER — Ambulatory Visit
Admission: RE | Admit: 2020-07-12 | Discharge: 2020-07-12 | Disposition: A | Discharge status: Home / Self Care | Payer: Medicare Other | Attending: Podiatry | Admitting: Podiatry

## 2020-07-12 ENCOUNTER — Other Ambulatory Visit: Payer: Self-pay

## 2020-07-12 ENCOUNTER — Ambulatory Visit: Payer: Medicare Other

## 2020-07-12 DIAGNOSIS — Z01818 Encounter for other preprocedural examination: Secondary | ICD-10-CM | POA: Diagnosis not present

## 2020-07-12 DIAGNOSIS — Z538 Procedure and treatment not carried out for other reasons: Secondary | ICD-10-CM | POA: Diagnosis not present

## 2020-07-12 LAB — POCT I-STAT, CHEM 8
BUN: 11 mg/dL (ref 6–20)
Calcium, Ion: 0.96 mmol/L — ABNORMAL LOW (ref 1.15–1.40)
Chloride: 100 mmol/L (ref 98–111)
Creatinine, Ser: 0.6 mg/dL (ref 0.44–1.00)
Glucose, Bld: 141 mg/dL — ABNORMAL HIGH (ref 70–99)
HCT: 39 % (ref 36.0–46.0)
Hemoglobin: 13.3 g/dL (ref 12.0–15.0)
Potassium: 2.9 mmol/L — ABNORMAL LOW (ref 3.5–5.1)
Sodium: 137 mmol/L (ref 135–145)
TCO2: 27 mmol/L (ref 22–32)

## 2020-07-12 LAB — GLUCOSE, CAPILLARY: Glucose-Capillary: 183 mg/dL — ABNORMAL HIGH (ref 70–99)

## 2020-07-12 LAB — POCT PREGNANCY, URINE: Preg Test, Ur: NEGATIVE

## 2020-07-12 LAB — POTASSIUM: Potassium: 2.6 mmol/L — CL (ref 3.5–5.1)

## 2020-07-12 SURGERY — RECESSION, TENDON, GASTROCNEMIUS
Anesthesia: Choice | Laterality: Left

## 2020-07-12 MED ORDER — NEOMYCIN-POLYMYXIN B GU 40-200000 IR SOLN
Status: AC
  Filled 2020-07-12: qty 2

## 2020-07-12 MED ORDER — CEFAZOLIN SODIUM-DEXTROSE 2-4 GM/100ML-% IV SOLN
INTRAVENOUS | Status: AC
  Filled 2020-07-12: qty 100

## 2020-07-12 MED ORDER — BUPIVACAINE HCL (PF) 0.5 % IJ SOLN
INTRAMUSCULAR | Status: AC
  Filled 2020-07-12: qty 60

## 2020-07-12 MED ORDER — CHLORHEXIDINE GLUCONATE 0.12 % MT SOLN
OROMUCOSAL | Status: AC
  Administered 2020-07-12: 15 mL via OROMUCOSAL
  Filled 2020-07-12: qty 15

## 2020-07-12 MED ORDER — SEVOFLURANE IN SOLN
RESPIRATORY_TRACT | Status: AC
  Filled 2020-07-12: qty 250

## 2020-07-12 MED ORDER — FAMOTIDINE 20 MG PO TABS
ORAL_TABLET | ORAL | Status: AC
  Administered 2020-07-12: 20 mg via ORAL
  Filled 2020-07-12: qty 1

## 2020-07-12 MED ORDER — BUPIVACAINE LIPOSOME 1.3 % IJ SUSP
INTRAMUSCULAR | Status: AC
  Filled 2020-07-12: qty 20

## 2020-07-12 MED ORDER — BUPIVACAINE HCL (PF) 0.5 % IJ SOLN
INTRAMUSCULAR | Status: AC
  Filled 2020-07-12: qty 30

## 2020-07-12 MED ORDER — FENTANYL CITRATE (PF) 100 MCG/2ML IJ SOLN
INTRAMUSCULAR | Status: AC
  Filled 2020-07-12: qty 2

## 2020-07-12 MED ORDER — FAMOTIDINE 20 MG PO TABS: 20.0000 mg | ORAL_TABLET | Freq: Once | ORAL | Status: AC

## 2020-07-12 MED ORDER — PROPOFOL 10 MG/ML IV BOLUS
INTRAVENOUS | Status: AC
  Filled 2020-07-12: qty 20

## 2020-07-12 MED ORDER — LIDOCAINE HCL (PF) 1 % IJ SOLN
INTRAMUSCULAR | Status: AC
  Filled 2020-07-12: qty 30

## 2020-07-12 MED ORDER — MIDAZOLAM HCL 2 MG/2ML IJ SOLN
INTRAMUSCULAR | Status: AC
  Filled 2020-07-12: qty 2

## 2020-07-12 NOTE — H&P (View-Only) (Signed)
HISTORY AND PHYSICAL INTERVAL NOTE:  07/12/2020  7:45 AM  [name]  Pts K was 2.6 this am.  Unable to perform surgery and will plan to defer till next week.   [name]

## 2020-07-12 NOTE — H&P (Signed)
HISTORY AND PHYSICAL INTERVAL NOTE:  07/12/2020  7:45 AM  [name]  Pts K was 2.6 this am.  Unable to perform surgery and will plan to defer till next week.   [name] A

## 2020-07-16 ENCOUNTER — Encounter (HOSPITAL_COMMUNITY): Payer: Self-pay

## 2020-07-18 ENCOUNTER — Other Ambulatory Visit: Payer: Self-pay | Admitting: Podiatry

## 2020-07-19 ENCOUNTER — Ambulatory Visit
Admission: RE | Admit: 2020-07-19 | Discharge: 2020-07-19 | Disposition: A | Discharge status: Home / Self Care | Payer: Medicare Other | Attending: Podiatry | Admitting: Podiatry

## 2020-07-19 ENCOUNTER — Ambulatory Visit
Admission: RE | Admit: 2020-07-19 | Discharge: 2020-07-19 | Disposition: A | Discharge status: Home / Self Care | Payer: Medicare Other | Source: Other Acute Inpatient Hospital | Attending: Podiatry | Admitting: Podiatry

## 2020-07-19 ENCOUNTER — Ambulatory Visit: Payer: Medicare Other | Admitting: Anesthesiology

## 2020-07-19 ENCOUNTER — Ambulatory Visit (HOSPITAL_COMMUNITY): Admission: RE | Admit: 2020-07-19 | Payer: Medicare Other | Source: Ambulatory Visit | Admitting: Podiatry

## 2020-07-19 ENCOUNTER — Other Ambulatory Visit: Payer: Self-pay

## 2020-07-19 ENCOUNTER — Encounter
Admission: RE | Disposition: A | Discharge status: Home / Self Care | Payer: Self-pay | Source: Other Acute Inpatient Hospital | Attending: Podiatry

## 2020-07-19 ENCOUNTER — Ambulatory Visit: Admit: 2020-07-19 | Payer: Medicare Other | Admitting: Podiatry

## 2020-07-19 ENCOUNTER — Encounter: Payer: Self-pay | Admitting: Podiatry

## 2020-07-19 ENCOUNTER — Encounter
Admission: RE | Disposition: A | Discharge status: Home / Self Care | Payer: Self-pay | Source: Home / Self Care | Attending: Podiatry

## 2020-07-19 ENCOUNTER — Ambulatory Visit: Payer: Medicare Other

## 2020-07-19 DIAGNOSIS — Z885 Allergy status to narcotic agent status: Secondary | ICD-10-CM | POA: Insufficient documentation

## 2020-07-19 DIAGNOSIS — Z7951 Long term (current) use of inhaled steroids: Secondary | ICD-10-CM | POA: Insufficient documentation

## 2020-07-19 DIAGNOSIS — Z79890 Hormone replacement therapy (postmenopausal): Secondary | ICD-10-CM | POA: Insufficient documentation

## 2020-07-19 DIAGNOSIS — M76822 Posterior tibial tendinitis, left leg: Secondary | ICD-10-CM | POA: Insufficient documentation

## 2020-07-19 DIAGNOSIS — Z7984 Long term (current) use of oral hypoglycemic drugs: Secondary | ICD-10-CM | POA: Insufficient documentation

## 2020-07-19 DIAGNOSIS — M24575 Contracture, left foot: Secondary | ICD-10-CM | POA: Insufficient documentation

## 2020-07-19 DIAGNOSIS — M2042 Other hammer toe(s) (acquired), left foot: Secondary | ICD-10-CM | POA: Insufficient documentation

## 2020-07-19 DIAGNOSIS — M21072 Valgus deformity, not elsewhere classified, left ankle: Secondary | ICD-10-CM | POA: Insufficient documentation

## 2020-07-19 DIAGNOSIS — Z79899 Other long term (current) drug therapy: Secondary | ICD-10-CM | POA: Insufficient documentation

## 2020-07-19 DIAGNOSIS — Z87892 Personal history of anaphylaxis: Secondary | ICD-10-CM | POA: Insufficient documentation

## 2020-07-19 DIAGNOSIS — M21862 Other specified acquired deformities of left lower leg: Secondary | ICD-10-CM | POA: Diagnosis not present

## 2020-07-19 HISTORY — PX: FLAT FOOT CORRECTION: SHX6619

## 2020-07-19 HISTORY — PX: TENDON TRANSFER: SHX6109

## 2020-07-19 HISTORY — PX: GASTROC RECESSION EXTREMITY: SHX6262

## 2020-07-19 SURGERY — RECESSION, TENDON, GASTROCNEMIUS
Anesthesia: Choice | Laterality: Left

## 2020-07-19 SURGERY — RECESSION, TENDON, GASTROCNEMIUS
Anesthesia: General | Laterality: Left

## 2020-07-19 MED ORDER — OXYCODONE HCL 5 MG PO TABS
ORAL_TABLET | ORAL | Status: AC
  Administered 2020-07-19: 5 mg via ORAL
  Filled 2020-07-19: qty 1

## 2020-07-19 MED ORDER — FENTANYL CITRATE (PF) 100 MCG/2ML IJ SOLN
INTRAMUSCULAR | Status: AC
  Administered 2020-07-19: 25 ug via INTRAVENOUS
  Filled 2020-07-19: qty 2

## 2020-07-19 MED ORDER — ROCURONIUM BROMIDE 10 MG/ML (PF) SYRINGE
PREFILLED_SYRINGE | INTRAVENOUS | Status: AC
  Filled 2020-07-19: qty 10

## 2020-07-19 MED ORDER — BUPIVACAINE HCL (PF) 0.25 % IJ SOLN
INTRAMUSCULAR | Status: DC | PRN
  Administered 2020-07-19: 10 mL

## 2020-07-19 MED ORDER — POVIDONE-IODINE 7.5 % EX SOLN
CUTANEOUS | Status: DC | PRN
  Filled 2020-07-19: qty 118

## 2020-07-19 MED ORDER — MIDAZOLAM HCL 2 MG/2ML IJ SOLN
INTRAMUSCULAR | Status: DC | PRN
  Administered 2020-07-19: 2 mg via INTRAVENOUS

## 2020-07-19 MED ORDER — ORAL CARE MOUTH RINSE: 15.0000 mL | Freq: Once | OROMUCOSAL | Status: DC

## 2020-07-19 MED ORDER — MIDAZOLAM HCL 2 MG/2ML IJ SOLN
INTRAMUSCULAR | Status: AC
  Filled 2020-07-19: qty 2

## 2020-07-19 MED ORDER — DEXAMETHASONE SODIUM PHOSPHATE 10 MG/ML IJ SOLN
INTRAMUSCULAR | Status: AC
  Filled 2020-07-19: qty 1

## 2020-07-19 MED ORDER — DIPHENHYDRAMINE HCL 50 MG/ML IJ SOLN
INTRAMUSCULAR | Status: AC
  Filled 2020-07-19: qty 1

## 2020-07-19 MED ORDER — CHLORHEXIDINE GLUCONATE 0.12 % MT SOLN
OROMUCOSAL | Status: AC
  Filled 2020-07-19: qty 15

## 2020-07-19 MED ORDER — PROPOFOL 10 MG/ML IV BOLUS
INTRAVENOUS | Status: DC | PRN
  Administered 2020-07-19: 120 mg via INTRAVENOUS

## 2020-07-19 MED ORDER — ONDANSETRON HCL 4 MG PO TABS: 4.0000 mg | ORAL_TABLET | Freq: Four times a day (QID) | ORAL | Status: DC | PRN

## 2020-07-19 MED ORDER — NEOMYCIN-POLYMYXIN B GU 40-200000 IR SOLN
Status: AC
  Filled 2020-07-19: qty 2

## 2020-07-19 MED ORDER — DIPHENHYDRAMINE HCL 50 MG/ML IJ SOLN
25.0000 mg | INTRAMUSCULAR | Status: AC
  Administered 2020-07-19: 25 mg via INTRAVENOUS

## 2020-07-19 MED ORDER — ONDANSETRON HCL 4 MG/2ML IJ SOLN: 4.0000 mg | Freq: Four times a day (QID) | INTRAMUSCULAR | Status: DC | PRN

## 2020-07-19 MED ORDER — CHLORHEXIDINE GLUCONATE 0.12 % MT SOLN
15.0000 mL | Freq: Once | OROMUCOSAL | Status: AC
  Administered 2020-07-19: 15 mL via OROMUCOSAL

## 2020-07-19 MED ORDER — LACTATED RINGERS IV SOLN: INTRAVENOUS | Status: DC | PRN

## 2020-07-19 MED ORDER — LIDOCAINE HCL (CARDIAC) PF 100 MG/5ML IV SOSY
PREFILLED_SYRINGE | INTRAVENOUS | Status: DC | PRN
  Administered 2020-07-19: 80 mg via INTRAVENOUS

## 2020-07-19 MED ORDER — OXYCODONE HCL 5 MG PO TABS: 5.0000 mg | ORAL_TABLET | Freq: Once | ORAL | Status: DC | PRN

## 2020-07-19 MED ORDER — OXYCODONE-ACETAMINOPHEN 5-325 MG PO TABS
1.0000 | ORAL_TABLET | Freq: Once | ORAL | Status: AC
  Administered 2020-07-19: 1 via ORAL

## 2020-07-19 MED ORDER — FENTANYL CITRATE (PF) 100 MCG/2ML IJ SOLN
INTRAMUSCULAR | Status: AC
  Filled 2020-07-19: qty 2

## 2020-07-19 MED ORDER — TRAMADOL HCL 50 MG PO TABS: 50.0000 mg | ORAL_TABLET | Freq: Once | ORAL | Status: AC | PRN

## 2020-07-19 MED ORDER — SEVOFLURANE IN SOLN
RESPIRATORY_TRACT | Status: AC
  Filled 2020-07-19: qty 250

## 2020-07-19 MED ORDER — DEXAMETHASONE SODIUM PHOSPHATE 10 MG/ML IJ SOLN
INTRAMUSCULAR | Status: DC | PRN
  Administered 2020-07-19: 10 mg via INTRAVENOUS

## 2020-07-19 MED ORDER — PHENYLEPHRINE HCL (PRESSORS) 10 MG/ML IV SOLN
INTRAVENOUS | Status: AC
  Filled 2020-07-19: qty 1

## 2020-07-19 MED ORDER — ONDANSETRON HCL 4 MG/2ML IJ SOLN
INTRAMUSCULAR | Status: DC | PRN
  Administered 2020-07-19: 4 mg via INTRAVENOUS

## 2020-07-19 MED ORDER — CEFAZOLIN SODIUM-DEXTROSE 2-4 GM/100ML-% IV SOLN
INTRAVENOUS | Status: AC
  Filled 2020-07-19: qty 100

## 2020-07-19 MED ORDER — CHLORHEXIDINE GLUCONATE 0.12 % MT SOLN: 15.0000 mL | Freq: Once | OROMUCOSAL | Status: DC

## 2020-07-19 MED ORDER — POVIDONE-IODINE 7.5 % EX SOLN
Freq: Once | CUTANEOUS | Status: DC
  Filled 2020-07-19: qty 118

## 2020-07-19 MED ORDER — LIDOCAINE HCL (PF) 2 % IJ SOLN
INTRAMUSCULAR | Status: AC
  Filled 2020-07-19: qty 5

## 2020-07-19 MED ORDER — OXYCODONE HCL 5 MG/5ML PO SOLN: 5.0000 mg | Freq: Once | ORAL | Status: DC | PRN

## 2020-07-19 MED ORDER — SODIUM CHLORIDE 0.9 % IV SOLN: INTRAVENOUS | Status: DC

## 2020-07-19 MED ORDER — PROPOFOL 10 MG/ML IV BOLUS
INTRAVENOUS | Status: AC
  Filled 2020-07-19: qty 40

## 2020-07-19 MED ORDER — LIDOCAINE HCL (PF) 1 % IJ SOLN
INTRAMUSCULAR | Status: AC
  Filled 2020-07-19: qty 30

## 2020-07-19 MED ORDER — BUPIVACAINE HCL (PF) 0.25 % IJ SOLN
INTRAMUSCULAR | Status: AC
  Filled 2020-07-19: qty 30

## 2020-07-19 MED ORDER — CEFAZOLIN SODIUM-DEXTROSE 2-4 GM/100ML-% IV SOLN: 2.0000 g | INTRAVENOUS | Status: DC

## 2020-07-19 MED ORDER — PHENYLEPHRINE HCL (PRESSORS) 10 MG/ML IV SOLN
INTRAVENOUS | Status: DC | PRN
  Administered 2020-07-19 (×3): 100 ug via INTRAVENOUS
  Administered 2020-07-19: 50 ug via INTRAVENOUS
  Administered 2020-07-19 (×2): 100 ug via INTRAVENOUS
  Administered 2020-07-19: 150 ug via INTRAVENOUS

## 2020-07-19 MED ORDER — ACETAMINOPHEN 10 MG/ML IV SOLN
INTRAVENOUS | Status: AC
  Filled 2020-07-19: qty 100

## 2020-07-19 MED ORDER — SUGAMMADEX SODIUM 200 MG/2ML IV SOLN
INTRAVENOUS | Status: DC | PRN
  Administered 2020-07-19: 200 mg via INTRAVENOUS

## 2020-07-19 MED ORDER — ORAL CARE MOUTH RINSE: 15.0000 mL | Freq: Once | OROMUCOSAL | Status: AC

## 2020-07-19 MED ORDER — OXYCODONE HCL 5 MG PO TABS: 5.0000 mg | ORAL_TABLET | ORAL | Status: AC

## 2020-07-19 MED ORDER — BUPIVACAINE LIPOSOME 1.3 % IJ SUSP
INTRAMUSCULAR | Status: AC
  Filled 2020-07-19: qty 20

## 2020-07-19 MED ORDER — BUPIVACAINE-EPINEPHRINE (PF) 0.25% -1:200000 IJ SOLN
INTRAMUSCULAR | Status: DC | PRN
  Administered 2020-07-19: 5 mL

## 2020-07-19 MED ORDER — OXYCODONE-ACETAMINOPHEN 5-325 MG PO TABS: 1.0000 | ORAL_TABLET | Freq: Four times a day (QID) | ORAL | 0 refills | Status: DC | PRN

## 2020-07-19 MED ORDER — CEFAZOLIN SODIUM-DEXTROSE 2-4 GM/100ML-% IV SOLN
2.0000 g | Freq: Once | INTRAVENOUS | Status: AC
  Administered 2020-07-19: 2 g via INTRAVENOUS

## 2020-07-19 MED ORDER — TRAMADOL HCL 50 MG PO TABS
ORAL_TABLET | ORAL | Status: AC
  Administered 2020-07-19: 50 mg via ORAL
  Filled 2020-07-19: qty 1

## 2020-07-19 MED ORDER — ONDANSETRON HCL 4 MG/2ML IJ SOLN: 4.0000 mg | Freq: Once | INTRAMUSCULAR | Status: DC | PRN

## 2020-07-19 MED ORDER — ACETAMINOPHEN 10 MG/ML IV SOLN: 1000.0000 mg | Freq: Once | INTRAVENOUS | Status: DC | PRN

## 2020-07-19 MED ORDER — HYDROXYZINE HCL 50 MG PO TABS
50.0000 mg | ORAL_TABLET | Freq: Once | ORAL | Status: AC | PRN
  Administered 2020-07-19: 50 mg via ORAL
  Filled 2020-07-19: qty 1

## 2020-07-19 MED ORDER — PROPOFOL 10 MG/ML IV BOLUS
INTRAVENOUS | Status: AC
  Filled 2020-07-19: qty 20

## 2020-07-19 MED ORDER — OXYCODONE-ACETAMINOPHEN 5-325 MG PO TABS
ORAL_TABLET | ORAL | Status: AC
  Filled 2020-07-19: qty 1

## 2020-07-19 MED ORDER — PHENYLEPHRINE HCL-NACL 10-0.9 MG/250ML-% IV SOLN
INTRAVENOUS | Status: DC | PRN
  Administered 2020-07-19: 25 ug/min via INTRAVENOUS

## 2020-07-19 MED ORDER — BUPIVACAINE-EPINEPHRINE (PF) 0.25% -1:200000 IJ SOLN
INTRAMUSCULAR | Status: AC
  Filled 2020-07-19: qty 30

## 2020-07-19 MED ORDER — FENTANYL CITRATE (PF) 100 MCG/2ML IJ SOLN
25.0000 ug | INTRAMUSCULAR | Status: DC | PRN
  Administered 2020-07-19: 25 ug via INTRAVENOUS
  Administered 2020-07-19: 50 ug via INTRAVENOUS
  Administered 2020-07-19: 25 ug via INTRAVENOUS

## 2020-07-19 MED ORDER — FENTANYL CITRATE (PF) 100 MCG/2ML IJ SOLN
INTRAMUSCULAR | Status: DC | PRN
  Administered 2020-07-19: 50 ug via INTRAVENOUS
  Administered 2020-07-19: 100 ug via INTRAVENOUS

## 2020-07-19 MED ORDER — ONDANSETRON HCL 4 MG/2ML IJ SOLN
INTRAMUSCULAR | Status: AC
  Filled 2020-07-19: qty 2

## 2020-07-19 MED ORDER — ROCURONIUM BROMIDE 100 MG/10ML IV SOLN
INTRAVENOUS | Status: DC | PRN
  Administered 2020-07-19: 40 mg via INTRAVENOUS

## 2020-07-19 MED ORDER — METOCLOPRAMIDE HCL 10 MG PO TABS: 5.0000 mg | ORAL_TABLET | Freq: Three times a day (TID) | ORAL | Status: DC | PRN

## 2020-07-19 MED ORDER — DIPHENHYDRAMINE HCL 50 MG/ML IJ SOLN
25.0000 mg | Freq: Once | INTRAMUSCULAR | Status: AC
  Administered 2020-07-19: 25 mg via INTRAVENOUS

## 2020-07-19 MED ORDER — ACETAMINOPHEN 10 MG/ML IV SOLN
INTRAVENOUS | Status: DC | PRN
  Administered 2020-07-19: 1000 mg via INTRAVENOUS

## 2020-07-19 MED ORDER — BUPIVACAINE HCL (PF) 0.5 % IJ SOLN
INTRAMUSCULAR | Status: AC
  Filled 2020-07-19: qty 60

## 2020-07-19 MED ORDER — SUCCINYLCHOLINE CHLORIDE 200 MG/10ML IV SOSY
PREFILLED_SYRINGE | INTRAVENOUS | Status: AC
  Filled 2020-07-19: qty 10

## 2020-07-19 MED ORDER — METOCLOPRAMIDE HCL 5 MG/ML IJ SOLN: 5.0000 mg | Freq: Three times a day (TID) | INTRAMUSCULAR | Status: DC | PRN

## 2020-07-19 SURGICAL SUPPLY — 96 items
ANCH SUT 4.5 FTPRNT PEEK-OPTM (Anchor) ×1 IMPLANT
ANCHOR 4.5 FOOTPRINT ULTRA (Anchor) ×2 IMPLANT
BIT DRILL SOLID 2.0 X 110MM (DRILL) ×1 IMPLANT
BLADE MED AGGRESSIVE (BLADE) ×2 IMPLANT
BLADE OSC/SAGITTAL MD 5.5X18 (BLADE) ×2 IMPLANT
BLADE OSCILLATING/SAGITTAL (BLADE) ×2
BLADE SURG 15 STRL LF DISP TIS (BLADE) ×2 IMPLANT
BLADE SURG 15 STRL SS (BLADE) ×4
BLADE SURG MINI STRL (BLADE) ×2 IMPLANT
BLADE SW THK.38XMED LNG THN (BLADE) ×1 IMPLANT
BNDG CMPR STD VLCR NS LF 5.8X4 (GAUZE/BANDAGES/DRESSINGS) ×2
BNDG COHESIVE 4X5 TAN STRL (GAUZE/BANDAGES/DRESSINGS) ×4 IMPLANT
BNDG CONFORM 2 STRL LF (GAUZE/BANDAGES/DRESSINGS) ×2 IMPLANT
BNDG CONFORM 3 STRL LF (GAUZE/BANDAGES/DRESSINGS) ×2 IMPLANT
BNDG ELASTIC 4X5.8 VLCR NS LF (GAUZE/BANDAGES/DRESSINGS) ×4 IMPLANT
BNDG ESMARK 4X12 TAN STRL LF (GAUZE/BANDAGES/DRESSINGS) ×2 IMPLANT
BNDG GAUZE 4.5X4.1 6PLY STRL (MISCELLANEOUS) ×2 IMPLANT
BUR 4X55 1 (BURR) ×2 IMPLANT
CANISTER SUCT 1200ML W/VALVE (MISCELLANEOUS) ×2 IMPLANT
COVER WAND RF STERILE (DRAPES) ×2 IMPLANT
CUFF TOURN SGL QUICK 18X4 (TOURNIQUET CUFF) IMPLANT
CUFF TOURN SGL QUICK 24 (TOURNIQUET CUFF)
CUFF TRNQT CYL 24X4X16.5-23 (TOURNIQUET CUFF) IMPLANT
DRAPE EXTREMITY 106X87X128.5 (DRAPES) ×2 IMPLANT
DRAPE FLUOR MINI C-ARM 54X84 (DRAPES) ×2 IMPLANT
DRAPE IMP U-DRAPE 54X76 (DRAPES) ×2 IMPLANT
DRILL SOLID 2.0 X 110MM (DRILL) ×2
DRSG TEGADERM 4X4.75 (GAUZE/BANDAGES/DRESSINGS) ×2 IMPLANT
DURAPREP 26ML APPLICATOR (WOUND CARE) ×2 IMPLANT
ELECT REM PT RETURN 9FT ADLT (ELECTROSURGICAL) ×2
ELECTRODE REM PT RTRN 9FT ADLT (ELECTROSURGICAL) ×1 IMPLANT
GAUZE SPONGE 4X4 12PLY STRL (GAUZE/BANDAGES/DRESSINGS) ×2 IMPLANT
GAUZE XEROFORM 1X8 LF (GAUZE/BANDAGES/DRESSINGS) ×2 IMPLANT
GLOVE SURG ENC MOIS LTX SZ7.5 (GLOVE) ×2 IMPLANT
GLOVE SURG UNDER LTX SZ8 (GLOVE) ×2 IMPLANT
GOWN STRL REUS W/ TWL XL LVL3 (GOWN DISPOSABLE) ×2 IMPLANT
GOWN STRL REUS W/TWL XL LVL3 (GOWN DISPOSABLE) ×4
HANDLE YANKAUER SUCT BULB TIP (MISCELLANEOUS) ×2 IMPLANT
K-WIRE SMOOTH TROCAR 2.0X150 (WIRE) ×4
KIT SHOULDER TRACTION (DRAPES) ×2 IMPLANT
KIT SUTURE 1.8 Q-FIX DISP (KITS) ×2 IMPLANT
KIT TURNOVER KIT A (KITS) ×2 IMPLANT
KWIRE SMOOTH TROCAR 2.0X150 (WIRE) ×2 IMPLANT
LABEL OR SOLS (LABEL) ×2 IMPLANT
MANIFOLD NEPTUNE II (INSTRUMENTS) ×2 IMPLANT
NDL MAYO CATGUT SZ5 (NEEDLE)
NDL SAFETY ECLIPSE 18X1.5 (NEEDLE) ×1 IMPLANT
NDL SUT 5 .5 CRC TPR PNT MAYO (NEEDLE) IMPLANT
NEEDLE FILTER BLUNT 18X 1/2SAF (NEEDLE) ×1
NEEDLE FILTER BLUNT 18X1 1/2 (NEEDLE) ×1 IMPLANT
NEEDLE HYPO 18GX1.5 SHARP (NEEDLE) ×2
NEEDLE HYPO 25X1 1.5 SAFETY (NEEDLE) ×2 IMPLANT
NS IRRIG 500ML POUR BTL (IV SOLUTION) ×2 IMPLANT
PACK EXTREMITY ARMC (MISCELLANEOUS) ×2 IMPLANT
PAD CAST CTTN 4X4 STRL (SOFTGOODS) ×2 IMPLANT
PADDING CAST BLEND 4X4 NS (MISCELLANEOUS) ×2 IMPLANT
PADDING CAST COTTON 4X4 STRL (SOFTGOODS) ×4
PENCIL ELECTRO HAND CTR (MISCELLANEOUS) ×2 IMPLANT
PLATE MEDIUM L 20 (Plate) ×2 IMPLANT
RASP SM TEAR CROSS CUT (RASP) ×2 IMPLANT
SCREW LOCK PLATE R3 2.7X14 (Screw) ×2 IMPLANT
SCREW LOCK PLATE R3 2.7X16 (Screw) ×2 IMPLANT
SCREW LOCK PLATE R3 2.7X18 (Screw) ×2 IMPLANT
SOL PREP PVP 2OZ (MISCELLANEOUS) ×2
SOLUTION PREP PVP 2OZ (MISCELLANEOUS) ×1 IMPLANT
SPLINT CAST 1 STEP 5X30 WHT (MISCELLANEOUS) ×2 IMPLANT
SPLINT FAST PLASTER 5X30 (CAST SUPPLIES) ×1
SPLINT PLASTER CAST FAST 5X30 (CAST SUPPLIES) ×1 IMPLANT
SPONGE LAP 18X18 RF (DISPOSABLE) ×2 IMPLANT
STOCKINETTE M/LG 89821 (MISCELLANEOUS) ×4 IMPLANT
STRIP CLOSURE SKIN 1/2X4 (GAUZE/BANDAGES/DRESSINGS) ×2 IMPLANT
STRIP CLOSURE SKIN 1/4X4 (GAUZE/BANDAGES/DRESSINGS) ×2 IMPLANT
SUT ETHILON 3-0 (SUTURE) ×2 IMPLANT
SUT MNCRL 4-0 (SUTURE) ×2
SUT MNCRL 4-0 27XMFL (SUTURE) ×1
SUT MNCRL+ 5-0 VIOLET P-3 (SUTURE) ×1 IMPLANT
SUT MONOCRYL 5-0 (SUTURE) ×1
SUT ULTRABRAID #2 38 (SUTURE) ×2 IMPLANT
SUT VIC AB 0 SH 27 (SUTURE) ×2 IMPLANT
SUT VIC AB 2-0 SH 27 (SUTURE) ×4
SUT VIC AB 2-0 SH 27XBRD (SUTURE) ×2 IMPLANT
SUT VIC AB 3-0 SH 27 (SUTURE) ×2
SUT VIC AB 3-0 SH 27X BRD (SUTURE) ×1 IMPLANT
SUT VIC AB 4-0 FS2 27 (SUTURE) ×2 IMPLANT
SUT VICRYL 3-0 CR8 SH (SUTURE) ×4 IMPLANT
SUT VICRYL AB 3-0 FS1 BRD 27IN (SUTURE) ×2 IMPLANT
SUTURE MNCRL 4-0 27XMF (SUTURE) ×1 IMPLANT
SWABSTK COMLB BENZOIN TINCTURE (MISCELLANEOUS) ×2 IMPLANT
SYR 10ML LL (SYRINGE) ×4 IMPLANT
SYR 3ML LL SCALE MARK (SYRINGE) ×2 IMPLANT
TRAY FOLEY MTR SLVR 16FR STAT (SET/KITS/TRAYS/PACK) ×2 IMPLANT
WAND TOPAZ MICRO DEBRIDER (MISCELLANEOUS) ×2 IMPLANT
WEDGE BONE 20MMH X 22MMD 8MMT (Tissue) ×2 IMPLANT
WIRE OLIVE SMOOTH 1.4MMX60MM (WIRE) ×2 IMPLANT
WIRE THREADED OLIVE 1.4 (WIRE) ×4 IMPLANT
WIRE Z .045 C-WIRE SPADE TIP (WIRE) ×2 IMPLANT

## 2020-07-19 SURGICAL SUPPLY — 77 items
BLADE MED AGGRESSIVE (BLADE) ×2 IMPLANT
BLADE OSCILLATING/SAGITTAL (BLADE) ×1
BLADE SURG 15 STRL LF DISP TIS (BLADE) ×2 IMPLANT
BLADE SURG 15 STRL SS (BLADE) ×2
BLADE SURG MINI STRL (BLADE) ×2 IMPLANT
BLADE SW THK.38XMED LNG THN (BLADE) ×1 IMPLANT
BNDG COHESIVE 4X5 TAN STRL (GAUZE/BANDAGES/DRESSINGS) ×2 IMPLANT
BNDG CONFORM 2 STRL LF (GAUZE/BANDAGES/DRESSINGS) ×2 IMPLANT
BNDG CONFORM 3 STRL LF (GAUZE/BANDAGES/DRESSINGS) ×2 IMPLANT
BNDG ELASTIC 4X5.8 VLCR NS LF (GAUZE/BANDAGES/DRESSINGS) ×4 IMPLANT
BNDG ESMARK 4X12 TAN STRL LF (GAUZE/BANDAGES/DRESSINGS) ×2 IMPLANT
BNDG GAUZE 4.5X4.1 6PLY STRL (MISCELLANEOUS) ×2 IMPLANT
BUR 4X55 1 (BURR) ×2 IMPLANT
CANISTER SUCT 1200ML W/VALVE (MISCELLANEOUS) ×2 IMPLANT
COVER WAND RF STERILE (DRAPES) ×2 IMPLANT
CUFF TOURN SGL QUICK 18X4 (TOURNIQUET CUFF) IMPLANT
CUFF TOURN SGL QUICK 24 (TOURNIQUET CUFF)
CUFF TRNQT CYL 24X4X16.5-23 (TOURNIQUET CUFF) IMPLANT
DRAPE FLUOR MINI C-ARM 54X84 (DRAPES) ×2 IMPLANT
DRSG TEGADERM 4X4.75 (GAUZE/BANDAGES/DRESSINGS) ×2 IMPLANT
DURAPREP 26ML APPLICATOR (WOUND CARE) ×2 IMPLANT
ELECT REM PT RETURN 9FT ADLT (ELECTROSURGICAL) ×2
ELECTRODE REM PT RTRN 9FT ADLT (ELECTROSURGICAL) ×1 IMPLANT
GAUZE SPONGE 4X4 12PLY STRL (GAUZE/BANDAGES/DRESSINGS) ×2 IMPLANT
GAUZE XEROFORM 1X8 LF (GAUZE/BANDAGES/DRESSINGS) ×2 IMPLANT
GLOVE SURG ENC MOIS LTX SZ7.5 (GLOVE) ×2 IMPLANT
GLOVE SURG UNDER LTX SZ8 (GLOVE) ×2 IMPLANT
GOWN STRL REUS W/ TWL XL LVL3 (GOWN DISPOSABLE) ×2 IMPLANT
GOWN STRL REUS W/TWL XL LVL3 (GOWN DISPOSABLE) ×2
HANDLE YANKAUER SUCT BULB TIP (MISCELLANEOUS) ×2 IMPLANT
KIT SHOULDER TRACTION (DRAPES) ×2 IMPLANT
KIT SUTURE 1.8 Q-FIX DISP (KITS) ×2 IMPLANT
KIT TURNOVER KIT A (KITS) ×2 IMPLANT
LABEL OR SOLS (LABEL) ×2 IMPLANT
MANIFOLD NEPTUNE II (INSTRUMENTS) ×2 IMPLANT
NDL MAYO CATGUT SZ5 (NEEDLE)
NDL SAFETY ECLIPSE 18X1.5 (NEEDLE) ×1 IMPLANT
NDL SUT 5 .5 CRC TPR PNT MAYO (NEEDLE) IMPLANT
NEEDLE FILTER BLUNT 18X 1/2SAF (NEEDLE) ×1
NEEDLE FILTER BLUNT 18X1 1/2 (NEEDLE) ×1 IMPLANT
NEEDLE HYPO 18GX1.5 SHARP (NEEDLE) ×1
NEEDLE HYPO 25X1 1.5 SAFETY (NEEDLE) ×2 IMPLANT
NS IRRIG 500ML POUR BTL (IV SOLUTION) ×2 IMPLANT
PACK EXTREMITY ARMC (MISCELLANEOUS) ×2 IMPLANT
PAD CAST CTTN 4X4 STRL (SOFTGOODS) ×2 IMPLANT
PADDING CAST BLEND 4X4 NS (MISCELLANEOUS) ×2 IMPLANT
PADDING CAST COTTON 4X4 STRL (SOFTGOODS) ×2
PENCIL ELECTRO HAND CTR (MISCELLANEOUS) ×2 IMPLANT
RASP SM TEAR CROSS CUT (RASP) ×2 IMPLANT
SOL PREP PVP 2OZ (MISCELLANEOUS) ×2
SOLUTION PREP PVP 2OZ (MISCELLANEOUS) ×1 IMPLANT
SPLINT CAST 1 STEP 5X30 WHT (MISCELLANEOUS) ×2 IMPLANT
SPLINT FAST PLASTER 5X30 (CAST SUPPLIES) ×1
SPLINT PLASTER CAST FAST 5X30 (CAST SUPPLIES) ×1 IMPLANT
SPONGE LAP 18X18 RF (DISPOSABLE) ×2 IMPLANT
STOCKINETTE M/LG 89821 (MISCELLANEOUS) ×2 IMPLANT
STRIP CLOSURE SKIN 1/2X4 (GAUZE/BANDAGES/DRESSINGS) ×2 IMPLANT
STRIP CLOSURE SKIN 1/4X4 (GAUZE/BANDAGES/DRESSINGS) ×2 IMPLANT
SUT ETHILON 3-0 (SUTURE) ×2 IMPLANT
SUT MNCRL 4-0 (SUTURE) ×1
SUT MNCRL 4-0 27XMFL (SUTURE) ×1
SUT MNCRL+ 5-0 VIOLET P-3 (SUTURE) ×1 IMPLANT
SUT MONOCRYL 5-0 (SUTURE) ×1
SUT ULTRABRAID #2 38 (SUTURE) ×2 IMPLANT
SUT VIC AB 0 SH 27 (SUTURE) ×2 IMPLANT
SUT VIC AB 2-0 SH 27 (SUTURE) ×2
SUT VIC AB 2-0 SH 27XBRD (SUTURE) ×2 IMPLANT
SUT VIC AB 3-0 SH 27 (SUTURE) ×1
SUT VIC AB 3-0 SH 27X BRD (SUTURE) ×1 IMPLANT
SUT VIC AB 4-0 FS2 27 (SUTURE) ×2 IMPLANT
SUT VICRYL 3-0 CR8 SH (SUTURE) ×4 IMPLANT
SUT VICRYL AB 3-0 FS1 BRD 27IN (SUTURE) ×2 IMPLANT
SUTURE MNCRL 4-0 27XMF (SUTURE) ×1 IMPLANT
SWABSTK COMLB BENZOIN TINCTURE (MISCELLANEOUS) ×2 IMPLANT
SYR 10ML LL (SYRINGE) ×4 IMPLANT
SYR 3ML LL SCALE MARK (SYRINGE) ×2 IMPLANT
TRAY FOLEY MTR SLVR 16FR STAT (SET/KITS/TRAYS/PACK) ×2 IMPLANT

## 2020-07-19 NOTE — Anesthesia Preprocedure Evaluation (Signed)
Anesthesia Evaluation  Patient identified by MRN, date of birth, ID band Patient awake  General Assessment Comment: Case previously cancelled for potassium level of 2.6 (diuretic use). Today it is 3.0.  Reviewed: Allergy & Precautions, NPO status , Patient's Chart, lab work & pertinent test results  History of Anesthesia Complications Negative for: history of anesthetic complications  Airway Mallampati: III  TM Distance: >3 FB Neck ROM: Full    Dental no notable dental hx. (+) Teeth Intact   Pulmonary asthma , sleep apnea and Continuous Positive Airway Pressure Ventilation , neg COPD, Patient abstained from smoking.Not current smoker,  Hospitalized 3-4 times in past 10 years for asthma. Never intubated. Took her inhaler this morning, breathing feels baselines   Pulmonary exam normal breath sounds clear to auscultation       Cardiovascular Exercise Tolerance: Good METShypertension, (-) CAD and (-) Past MI (-) dysrhythmias  Rhythm:Regular Rate:Normal - Systolic murmurs    Neuro/Psych  Headaches, PSYCHIATRIC DISORDERS Anxiety Depression    GI/Hepatic GERD  ,(+)     (-) substance abuse  ,   Endo/Other  neg diabetesHypothyroidism   Renal/GU negative Renal ROS     Musculoskeletal   Abdominal   Peds  Hematology   Anesthesia Other Findings Past Medical History: No date: Anxiety No date: Asthma No date: Carpal tunnel syndrome No date: Depression No date: Disc degeneration, lumbosacral No date: GERD (gastroesophageal reflux disease) No date: Hypertension No date: Hypothyroidism No date: Migraine No date: Pre-diabetes No date: Sleep apnea     Comment:  uses cpap No date: Thyroid disease  Reproductive/Obstetrics                             Anesthesia Physical Anesthesia Plan  ASA: 3  Anesthesia Plan: General   Post-op Pain Management:    Induction: Intravenous  PONV Risk Score and  Plan: 3 and Ondansetron, Dexamethasone and Midazolam  Airway Management Planned: Oral ETT  Additional Equipment: None  Intra-op Plan:   Post-operative Plan: Extubation in OR  Informed Consent: I have reviewed the patients History and Physical, chart, labs and discussed the procedure including the risks, benefits and alternatives for the proposed anesthesia with the patient or authorized representative who has indicated his/her understanding and acceptance.     Dental advisory given  Plan Discussed with: CRNA and Surgeon  Anesthesia Plan Comments: (Patient with good grasp of [place], consent performed in [place] per patient's request. Discussed risks of anesthesia with patient, including PONV, sore throat, lip/dental damage. Rare risks discussed as well, such as cardiorespiratory and neurological sequelae. Patient understands.)        Anesthesia Quick Evaluation

## 2020-07-19 NOTE — Op Note (Signed)
Operative note   Surgeon:Melvenia Favela Lawyer: None    Preop diagnosis: 1.  Gastroc equinus left lower leg 2.  Posterior tibial dysfunction 3.  Calcaneal valgus 4.  Hammertoe left fourth toe 5.  Contracture left fourth metatarsophalangeal joint    Postop diagnosis: Same    Procedure: 1.  Strayer gastroc recession left lower leg 2.  Evans calcaneal osteotomy left calcaneus 3 flexor digitorum longus transfer left foot 4.  Excision of accessory navicular with Kidner procedure left foot 5.  Hammertoe repair with arthroplasty left fourth toe PIPJ 6.  Metatarsophalangeal joint release left fourth metatarsophalangeal joint    EBL: 5 mL    Anesthesia:local and general local consisted of a total of 20 cc of Exparel long-acting anesthetic infiltrated to all areas as well as an additional 28 cc of 0.25% bupivacaine    Hemostasis: Thigh tourniquet inflated 250 mmHg for 120 minutes    Specimen: Accessory navicular left foot    Complications: None    Operative indications:[name] is an 50 y.o. that presents today for surgical intervention.  The risks/benefits/alternatives/complications have been discussed and consent has been given.    Procedure:  Patient was brought into the OR and placed on the operating table in theprone position. After anesthesia was obtained theleft lower extremity was prepped and draped in usual sterile fashion.  Attention was directed to the posterior aspect of the left calf where a longitudinal incision was made at the gastroc soleal junction.  Sharp and blunt dissection carried down to the peritenon.  Care was taken to retract the neurovascular structures.  The peritenon was entered and a Strayer medial to lateral gastroc recession was then performed.  Good excursion was noted.  The wound was flushed with copious amounts of irrigation.  Closure was performed with a 3-0 Vicryl for the peritenon and subcutaneous tissue and a Monocryl for the skin.  This was  then bandaged and covered sterilely.  The patient was then placed in a supine position.  Re-prep and drape was then performed.  Attention was directed to the lateral aspect of the calcaneus where a lateral incision was made overlying the peroneal tendon region.  Sharp and blunt dissection carried down to the lateral wall of the calcaneus.  Care was taken to retract the peroneal tendons at this level.  Next the proximal distal dorsal and plantar aspect of the calcaneal wall was then dissected away.  With the use of fluoroscopy a lateral to medial osteotomy was created.  Care was taken to keep the medial hand intact.  This was then opened in a trial Evans calcaneal wedge 8 mm was placed into the site.  Good realignment of the foot was noted with the talonavicular joint completely covered.  At this time using standard technique a Paragon 8mm Evans wedge was placed.  This was then stabilized with a small L plate to stabilize the site with 2.7 millimeter screws.  This wound was then flushed with copious amounts of irrigation and closure was performed with a 3-0 and 4-0 Vicryl and skin staples.  Attention was directed to the medial aspect of the left ankle and foot where course along the posterior tibial tendon and incision was made.  Care was taken to retract all vital neurovascular structures.  The posterior tibial tendon sheath was then entered.  The tendon sheath had some synovitis within the area.  The posterior tibial tendon had no obvious severe tearing.  A little bit of thickening was noted.  This was freed off of the navicular at this time.  An accessory navicular was noted and this was removed from the surgical field and sent for pathological examination.  Prominence of the medial aspect of the navicular was also demonstrated as well.  This was then excised.  Good removal of the prominence was noted at this time.  The flexor digitorum longus was then dissected just deep to the posterior tibial tendon and cut  as it enters the arch.  A Magnum wire was used to tack this.  A small puncture hole was made at the posterior tibial tendon at its navicular insertion site and the flexor digitorum longus was placed through this area.  A 5.0 mm footprint bone anchor was placed into the navicular.  The tendon was placed directly against the bone and tacked into place.  Good stability was noted.  At this time the posterior tibial tendon was then slightly advanced and sutured against the residual medial aspect of the navicular.  Good stability and alignment was noted.  The posterior tibial tendon was infiltrated with a Topaz wand.  Closure was then performed with closure of the tendon sheath with a 2-0 and 3-0 Vicryl as well as the subcutaneous tissue.  The skin was closed with skin staples.  Attention was directed to the left fourth toe where an incision was made overlying the PIPJ carried proximal to the level of the metatarsophalangeal joint.  The longus tensor tendon was noted and retracted at the PIPJ.  The head of the proximal phalanx was then excised with a power saw.  Better realignment of the toe was noted with residual contracture at the MTPJ.  A Z-plasty of the extensor tendon was performed at the level of the metatarsophalangeal joint.  The dorsal and lateral aspect of the capsule to the metatarsophalangeal joint was then opened.  Better realignment was noted at this time.  A 0.045 K wire was placed from the middle phalanx through the tip of the toe and retrograded back to the base of the proximal phalanx.  Good realignment was noted in all planes.  All wounds were flushed with copious amounts of irrigation.  Stabilization of the extensor tendon was performed with a 4-0 Vicryl with the Z-plasty lengthening of the extensor tendon noted.  The subcutaneous tissue was closed with a 4-0 Vicryl and the skin with a 4-0 nylon.  All areas were infiltrated with a combination of 0.25% bupivacaine and Exparel long-acting  anesthetic.  The tourniquet was dropped and good perfusion of all areas were noted including the fourth toe.  Patient was placed in a bulky sterile dressing and in an equalizer walker boot with the foot in a neutral position.    Patient tolerated the procedure and anesthesia well.  Was transported from the OR to the PACU with all vital signs stable and vascular status intact. To be discharged per routine protocol.  Will follow up in approximately 1 week in the outpatient clinic.

## 2020-07-19 NOTE — Progress Notes (Signed)
Patient c/o whole body itching . Already s/p benadryl 50mg  with minimal relief. Patient denies any dyspnea or throat closing.  Patient agrees to try hydroxyzine before she goes home.

## 2020-07-19 NOTE — Interval H&P Note (Signed)
History and Physical Interval Note:  07/19/2020 7:07 AM  [name]  has presented today for surgery, with the diagnosis of M76.822- Tibialis tendonitis of the left lower extremity M21.862- Gastrocnemus Equinus, Left.  The various methods of treatment have been discussed with the patient and family. After consideration of risks, benefits and other options for treatment, the patient has consented to  Procedure(s): GASTROC RECESSION EXTREMITY (Left) TENDON TRANSFER FDL transfer; Deep-L (Left) Evans/MCDO-L (Left) as a surgical intervention.  The patient's history has been reviewed, patient examined, no change in status, stable for surgery.  I have reviewed the patient's chart and labs.  Questions were answered to the patient's satisfaction.     [name] A

## 2020-07-19 NOTE — Anesthesia Procedure Notes (Signed)
Procedure Name: Intubation Date/Time: 07/19/2020 7:44 AM Performed by: Justus Memory, CRNA Pre-anesthesia Checklist: Patient identified, Patient being monitored, Timeout performed, Emergency Drugs available and Suction available Patient Re-evaluated:Patient Re-evaluated prior to induction Oxygen Delivery Method: Circle system utilized Preoxygenation: Pre-oxygenation with 100% oxygen Induction Type: IV induction Ventilation: Mask ventilation with difficulty and Oral airway inserted - appropriate to patient size Laryngoscope Size: Mac, 3 and McGraph Grade View: Grade I Tube type: Oral Tube size: 7.0 mm Number of attempts: 1 Airway Equipment and Method: Stylet and Video-laryngoscopy Placement Confirmation: ETT inserted through vocal cords under direct vision, positive ETCO2 and breath sounds checked- equal and bilateral Secured at: 21 cm Tube secured with: Tape Dental Injury: Teeth and Oropharynx as per pre-operative assessment  Difficulty Due To: Difficulty was anticipated, Difficult Airway- due to large tongue and Difficult Airway- due to anterior larynx Future Recommendations: Recommend- induction with short-acting agent, and alternative techniques readily available

## 2020-07-19 NOTE — OR Nursing (Signed)
DENIES ITCHING AND PAIN; DISCHARGED TO HOME.

## 2020-07-19 NOTE — H&P (Signed)
HISTORY AND PHYSICAL INTERVAL NOTE:  07/19/2020  7:08 AM  [name] [place]  has presented today for surgery, with the diagnosis of M76.822- Tibialis tendonitis of the left lower extremity M21.862- Gastrocnemus Equinus, Left.  Also hammertoe left 4th toe.  The various methods of treatment have been discussed with the patient.  No guarantees were given.  After consideration of risks, benefits and other options for treatment, the patient has consented to surgery.  I have reviewed the patients' chart and labs.     A history and physical examination was performed in my office.  The patient was reexamined.  There have been no changes to this history and physical examination.  [name] A

## 2020-07-19 NOTE — Transfer of Care (Signed)
Immediate Anesthesia Transfer of Care Note  Patient: [name]  Procedure(s) Performed: GASTROC RECESSION EXTREMITY (Left) TENDON TRANSFER FDL transfer; Deep-L (Left) Evans/MCDO-L (Left)  Patient Location: PACU  Anesthesia Type:General  Level of Consciousness: sedated  Airway & Oxygen Therapy: Patient Spontanous Breathing and Patient connected to face mask oxygen  Post-op Assessment: Report given to RN and Post -op Vital signs reviewed and stable  Post vital signs: Reviewed and stable  Last Vitals:  Vitals Value Taken Time  BP 101/66 07/19/20 1200  Temp 36.1 C 07/19/20 1130  Pulse 67 07/19/20 1203  Resp 13 07/19/20 1203  SpO2 92 % 07/19/20 1203  Vitals shown include unvalidated device data.  Last Pain:  Vitals:   07/19/20 1141  TempSrc:   PainSc: 8          Complications: No notable events documented.

## 2020-07-19 NOTE — Addendum Note (Signed)
Addendum  created 07/19/20 1451 by Arita Miss, MD   Clinical Note Signed, Order list changed, Pharmacy for encounter modified

## 2020-07-19 NOTE — Discharge Instructions (Addendum)
[name]  POST OPERATIVE INSTRUCTIONS FOR DR. Vickki Muff AND DR. Iola   Take your medication as prescribed.  Pain medication should be taken only as needed.  Keep the dressing clean, dry and intact.  Keep your foot elevated above the heart level for the first 48 hours.  We have instructed you to be non-weight bearing.  Always use your crutches if you are to be non-weight bearing.   Every hour you are awake:  Bend your knee 15 times.   Call [facility] [phone]) if any of the following problems occur: You develop a temperature or fever. The bandage becomes saturated with blood. Medication does not stop your pain. Injury of the foot occurs. Any symptoms of infection including redness, odor, or red streaks running from wound.      AMBULATORY SURGERY  DISCHARGE INSTRUCTIONS   The drugs that you were given will stay in your system until tomorrow so for the next 24 hours you should not:  Drive an automobile Make any legal decisions Drink any alcoholic beverage   You may resume regular meals tomorrow.  Today it is better to start with liquids and gradually work up to solid foods.  You may eat anything you prefer, but it is better to start with liquids, then soup and crackers, and gradually work up to solid foods.   Please notify your doctor immediately if you have any unusual bleeding, trouble breathing, redness and pain at the surgery site, drainage, fever, or pain not relieved by medication.    Additional Instructions:        Please contact your physician with any problems or Same Day Surgery at [phone], Monday through Friday 6 am to 4 pm, or [facility] at [facility] number at [phone].

## 2020-07-19 NOTE — Anesthesia Postprocedure Evaluation (Signed)
Anesthesia Post Note  Patient: [name]  Procedure(s) Performed: GASTROC RECESSION EXTREMITY (Left) TENDON TRANSFER FDL transfer; Deep-L (Left) Evans/MCDO-L (Left)  Patient location during evaluation: PACU Anesthesia Type: General Level of consciousness: awake and alert Pain management: pain level controlled Vital Signs Assessment: post-procedure vital signs reviewed and stable Respiratory status: spontaneous breathing, nonlabored ventilation, respiratory function stable and patient connected to nasal cannula oxygen Cardiovascular status: stable and blood pressure returned to baseline Postop Assessment: no apparent nausea or vomiting Anesthetic complications: no  No notable events documented.   Last Vitals:  Vitals:   07/19/20 1230 07/19/20 1245  BP: 94/61 100/64  Pulse: 67 (!) 56  Resp: 16 17  Temp: (!) 36.2 C   SpO2: 93% 96%    Last Pain:  Vitals:   07/19/20 1226  TempSrc:   PainSc: 9                  [name]

## 2020-07-22 ENCOUNTER — Encounter: Payer: Self-pay | Admitting: Podiatry

## 2020-07-22 LAB — SURGICAL PATHOLOGY

## 2020-07-24 LAB — POCT I-STAT, CHEM 8
BUN: 12 mg/dL (ref 6–20)
Calcium, Ion: 1.14 mmol/L — ABNORMAL LOW (ref 1.15–1.40)
Chloride: 99 mmol/L (ref 98–111)
Creatinine, Ser: 0.7 mg/dL (ref 0.44–1.00)
Glucose, Bld: 95 mg/dL (ref 70–99)
HCT: 43 % (ref 36.0–46.0)
Hemoglobin: 14.6 g/dL (ref 12.0–15.0)
Potassium: 3 mmol/L — ABNORMAL LOW (ref 3.5–5.1)
Sodium: 139 mmol/L (ref 135–145)
TCO2: 30 mmol/L (ref 22–32)

## 2020-09-24 ENCOUNTER — Other Ambulatory Visit: Payer: Self-pay

## 2020-09-24 ENCOUNTER — Emergency Department
Admission: EM | Admit: 2020-09-24 | Discharge: 2020-09-24 | Disposition: A | Discharge status: Home / Self Care | Payer: Medicare Other | Attending: Emergency Medicine | Admitting: Emergency Medicine

## 2020-09-24 ENCOUNTER — Emergency Department: Payer: Medicare Other

## 2020-09-24 DIAGNOSIS — R519 Headache, unspecified: Secondary | ICD-10-CM | POA: Diagnosis present

## 2020-09-24 DIAGNOSIS — Z5321 Procedure and treatment not carried out due to patient leaving prior to being seen by health care provider: Secondary | ICD-10-CM | POA: Insufficient documentation

## 2020-09-24 DIAGNOSIS — G43909 Migraine, unspecified, not intractable, without status migrainosus: Secondary | ICD-10-CM | POA: Insufficient documentation

## 2020-09-24 DIAGNOSIS — W182XXA Fall in (into) shower or empty bathtub, initial encounter: Secondary | ICD-10-CM | POA: Diagnosis not present

## 2020-09-24 DIAGNOSIS — M542 Cervicalgia: Secondary | ICD-10-CM | POA: Insufficient documentation

## 2020-09-24 LAB — CBC
HCT: 42.8 % (ref 36.0–46.0)
Hemoglobin: 15.4 g/dL — ABNORMAL HIGH (ref 12.0–15.0)
MCH: 33 pg (ref 26.0–34.0)
MCHC: 36 g/dL (ref 30.0–36.0)
MCV: 91.6 fL (ref 80.0–100.0)
Platelets: 424 10*3/uL — ABNORMAL HIGH (ref 150–400)
RBC: 4.67 MIL/uL (ref 3.87–5.11)
RDW: 12.1 % (ref 11.5–15.5)
WBC: 6.4 10*3/uL (ref 4.0–10.5)
nRBC: 0 % (ref 0.0–0.2)

## 2020-09-24 LAB — BASIC METABOLIC PANEL
Anion gap: 11 (ref 5–15)
BUN: 10 mg/dL (ref 6–20)
CO2: 28 mmol/L (ref 22–32)
Calcium: 10 mg/dL (ref 8.9–10.3)
Chloride: 99 mmol/L (ref 98–111)
Creatinine, Ser: 0.77 mg/dL (ref 0.44–1.00)
GFR, Estimated: >60 mL/min (ref >60)
Glucose, Bld: 107 mg/dL — ABNORMAL HIGH (ref 70–99)
Potassium: 3.5 mmol/L (ref 3.5–5.1)
Sodium: 138 mmol/L (ref 135–145)

## 2020-09-24 IMAGING — CT CT CERVICAL SPINE W/O CM
3 of 4 series · 12 of 33 positions shown, 14 images · non-contrast
Comparison: MRI cervical spine [DATE]

CLINICAL DATA: Head trauma, migraine, dizziness, fell in bathtub
last week striking her head, hurting since, neck pain

EXAM:
CT HEAD WITHOUT CONTRAST
CT CERVICAL SPINE WITHOUT CONTRAST
TECHNIQUE: Multidetector CT imaging of the head and cervical spine was
performed following the standard protocol without intravenous
contrast. Multiplanar CT image reconstructions of the cervical spine
were also generated.

[Series 6: sagittal bone · sagittal · 0.26mm/px · 5 of 58 slices shown, 6 images]
[im 20/58  bone]
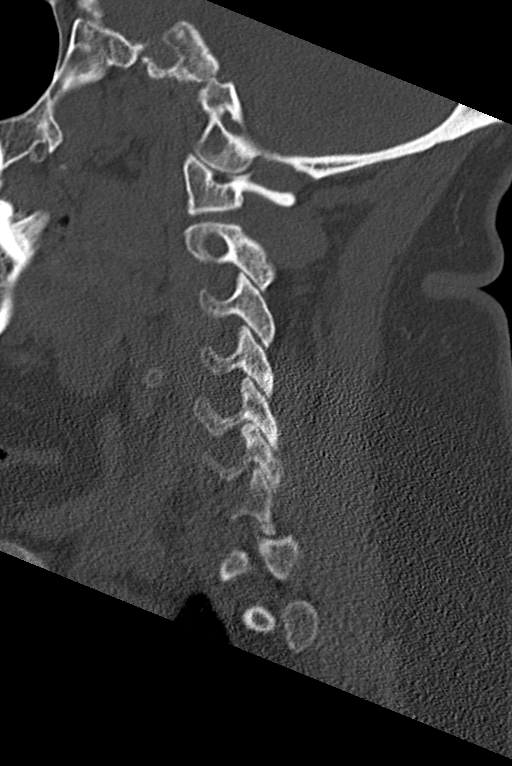
[im 24/58  bone]
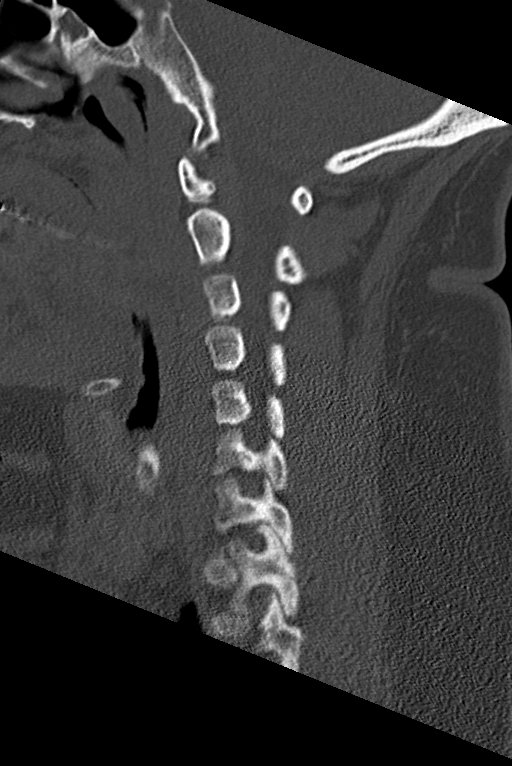
[im 29/58  soft-tissue]
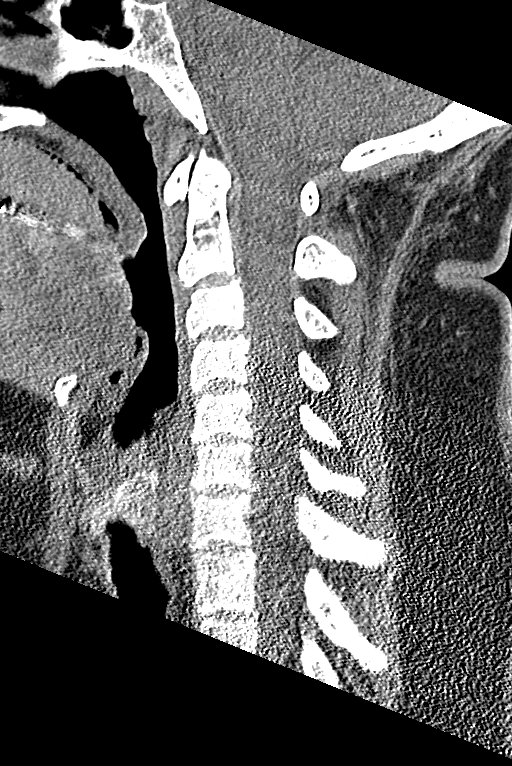
[im 29/58  bone]
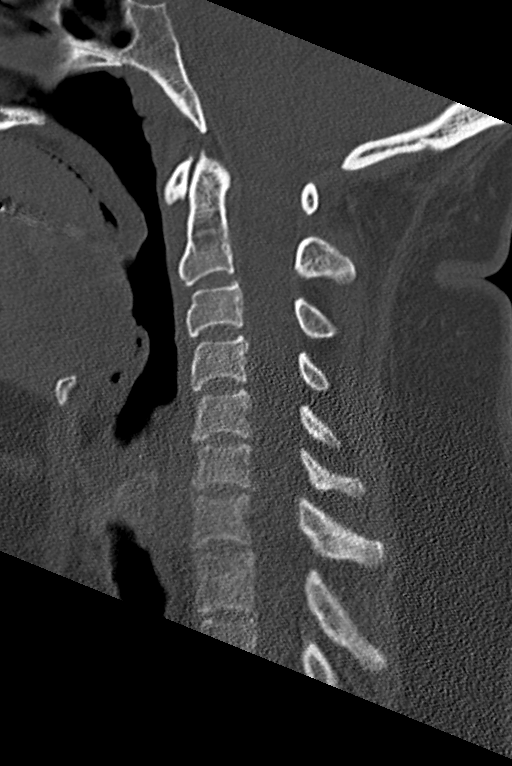
[im 34/58  bone]
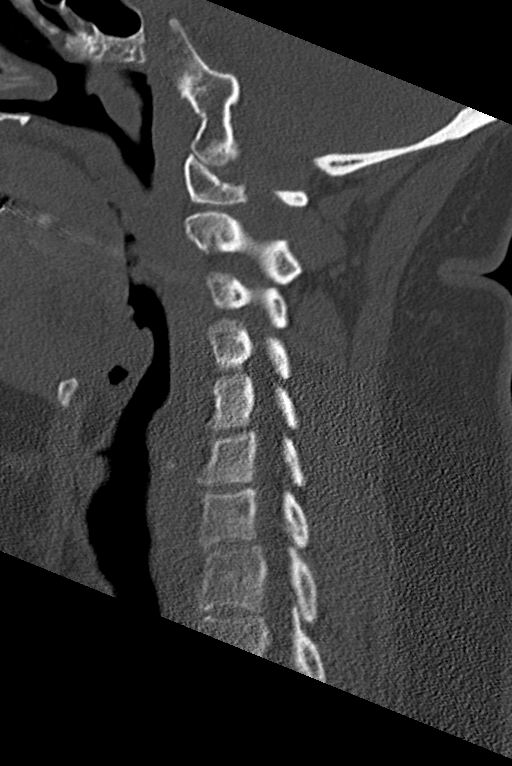
[im 39/58  bone]
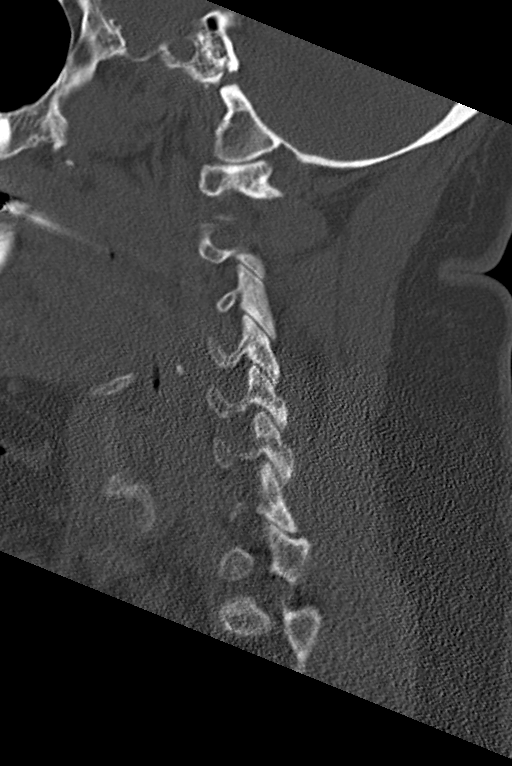

[Series 7: coronal bone · coronal · 0.23mm/px · 3 of 61 slices shown]
[im 13/61  bone]
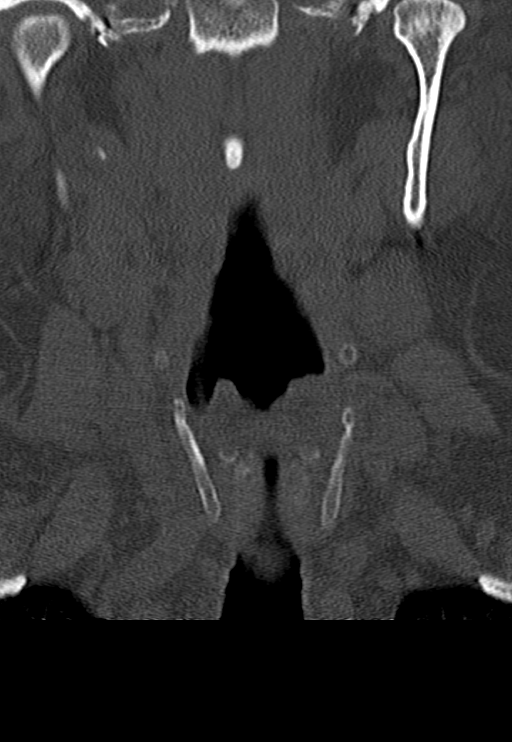
[im 25/61  bone]
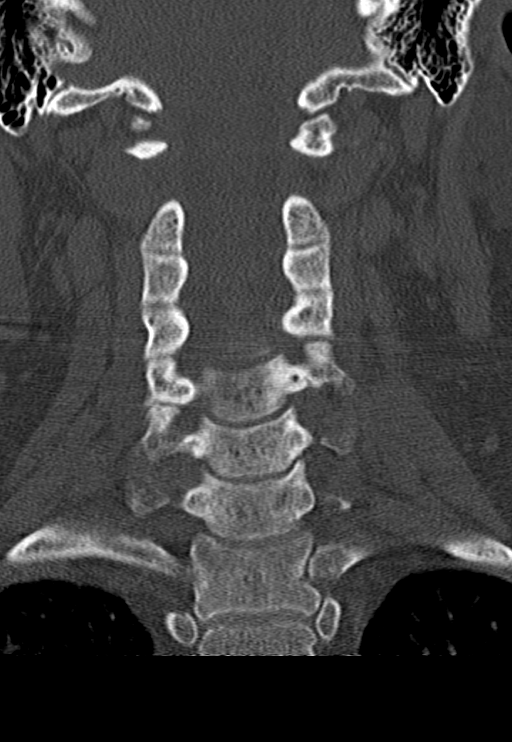
[im 37/61  bone]
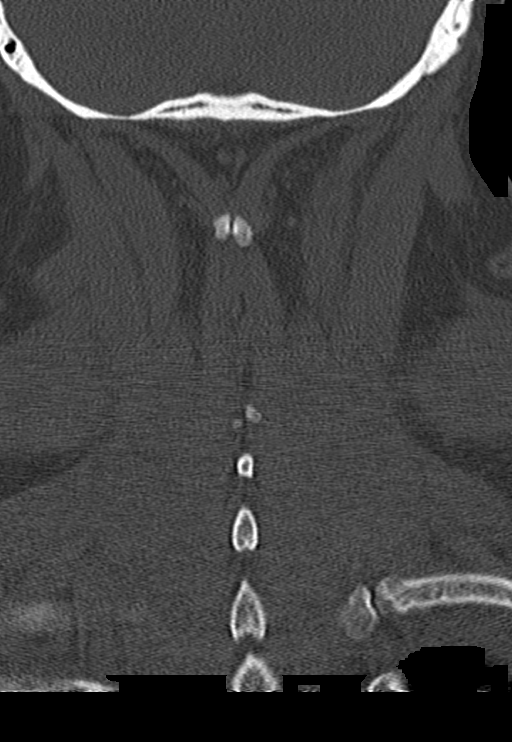

[Series 8: orthogonal bone · axial · 0.23mm/px · z∈[-278,-157]mm · 4 of 92 slices shown, 5 images]
[im 14/92  soft-tissue]
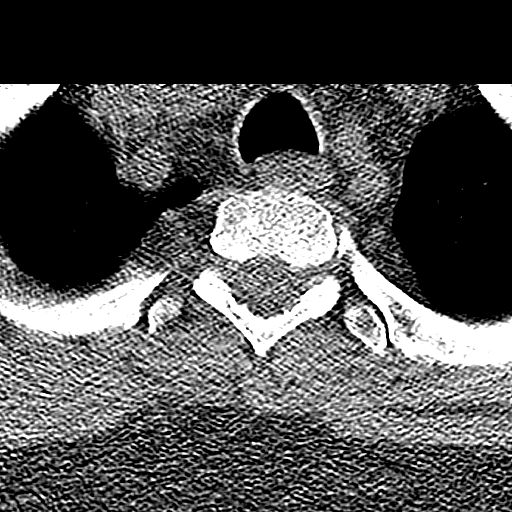
[im 14/92  bone]
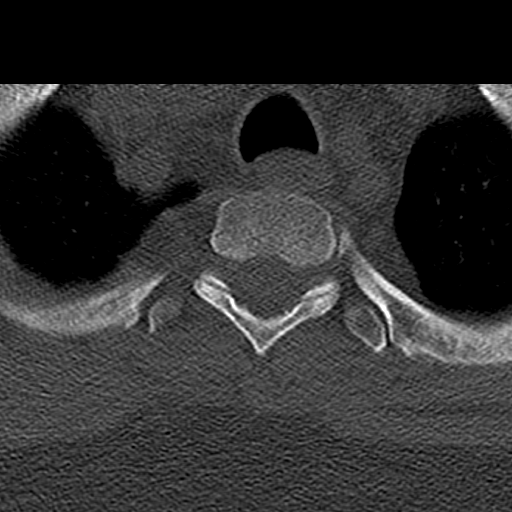
[im 40/92  bone]
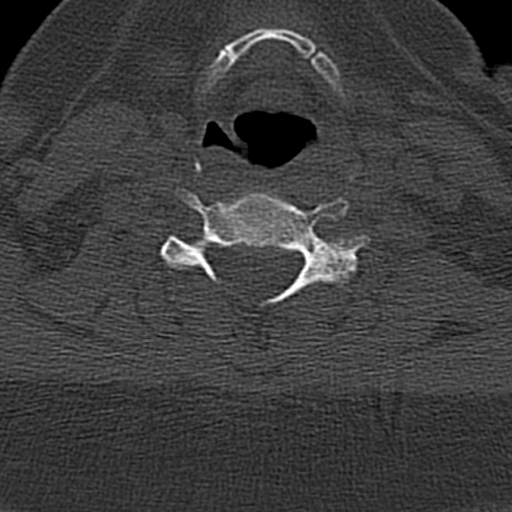
[im 53/92  bone]
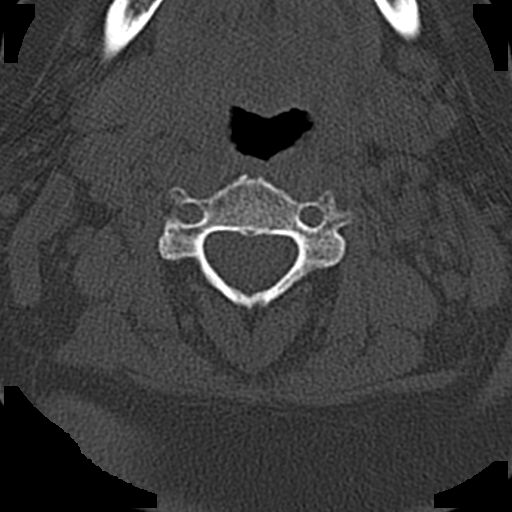
[im 79/92  bone]
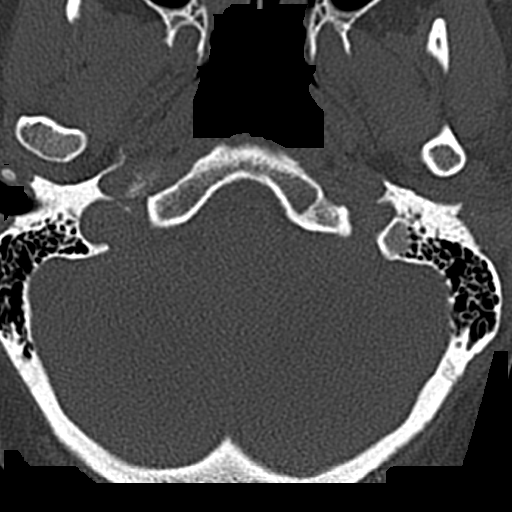

[12 of 33 positions shown; findings below may reference images not displayed]

FINDINGS: CT HEAD FINDINGS

Brain: Mild atrophy. Normal ventricular morphology. No midline shift
or mass effect. Otherwise normal appearance of brain parenchyma. No
intracranial hemorrhage, mass lesion or evidence of acute
infarction. No extra-axial fluid collections.

Vascular: No hyperdense vessels

Skull: Intact

Sinuses/Orbits: Clear

Other: N/A

CT CERVICAL SPINE FINDINGS

Alignment: Normal

Skull base and vertebrae: Osseous mineralization normal. Skull base
intact. Vertebral body and disc space heights maintained. No
fracture, subluxation, or bone destruction.

Soft tissues and spinal canal: Prevertebral soft tissues normal
thickness.

Disc levels:  No specific abnormalities

Upper chest: Lung apices clear

Other: N/A
IMPRESSION: Mild generalized atrophy.

No acute intracranial abnormalities.

Normal CT cervical spine.

## 2020-09-24 IMAGING — CT CT HEAD W/O CM
4 series · 14 of 47 positions shown, 16 images · non-contrast
Comparison: MRI cervical spine [DATE]

CLINICAL DATA: Head trauma, migraine, dizziness, fell in bathtub
last week striking her head, hurting since, neck pain

EXAM:
CT HEAD WITHOUT CONTRAST
CT CERVICAL SPINE WITHOUT CONTRAST
TECHNIQUE: Multidetector CT imaging of the head and cervical spine was
performed following the standard protocol without intravenous
contrast. Multiplanar CT image reconstructions of the cervical spine
were also generated.

[Series 2: head wo · axial · 0.43mm/px · z∈[-116,-1]mm · 7 of 31 slices shown, 9 images]
[im 4/31  brain]
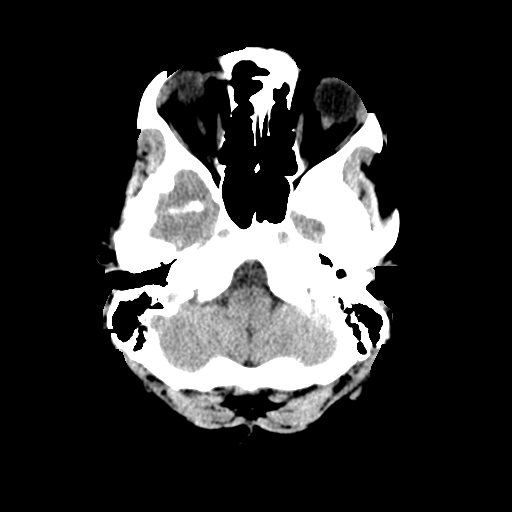
[im 4/31  bone]
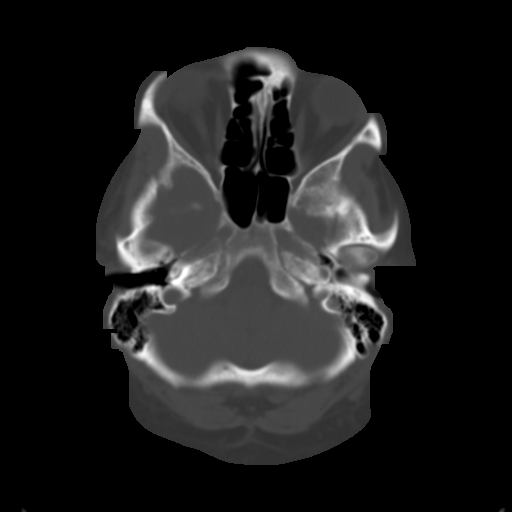
[im 8/31  brain]
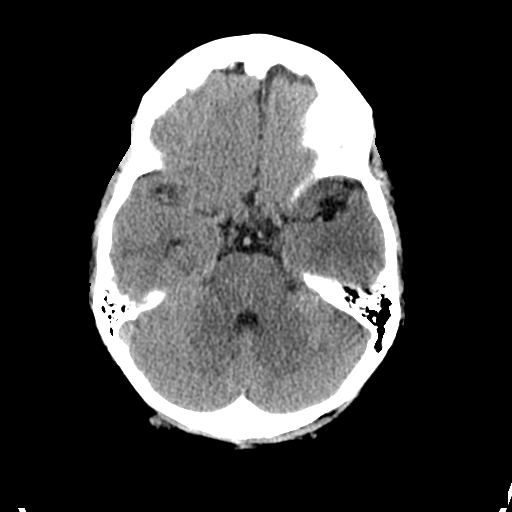
[im 12/31  brain]
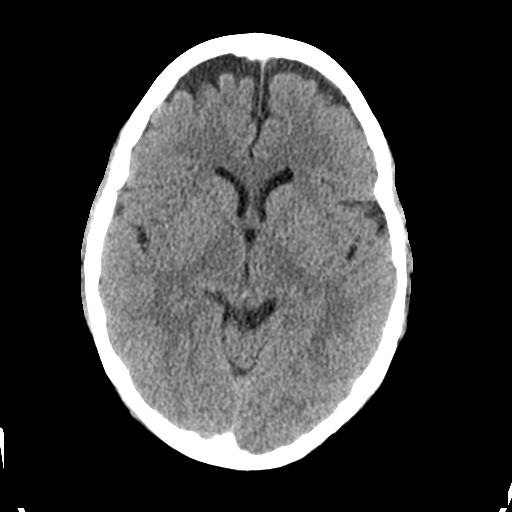
[im 16/31  brain]
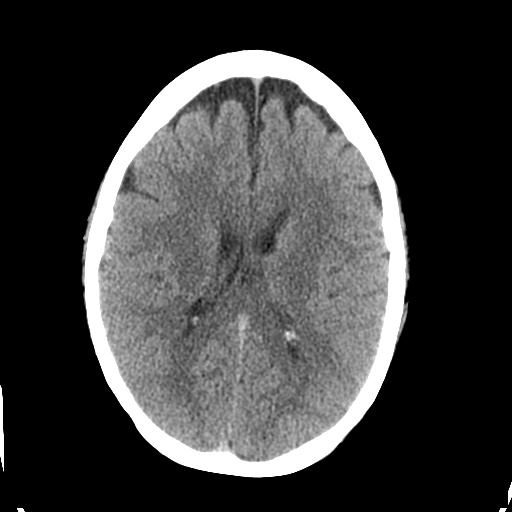
[im 19/31  brain]
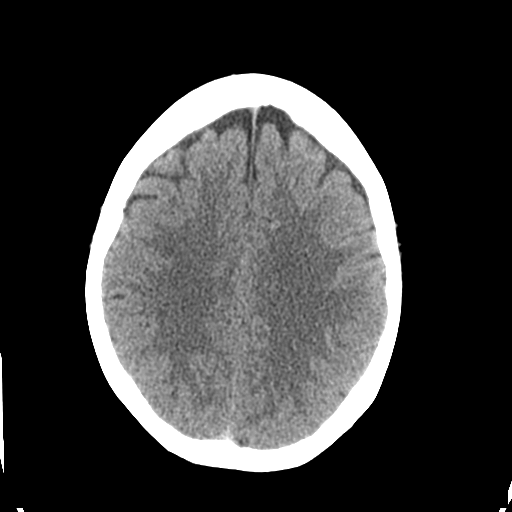
[im 19/31  bone]
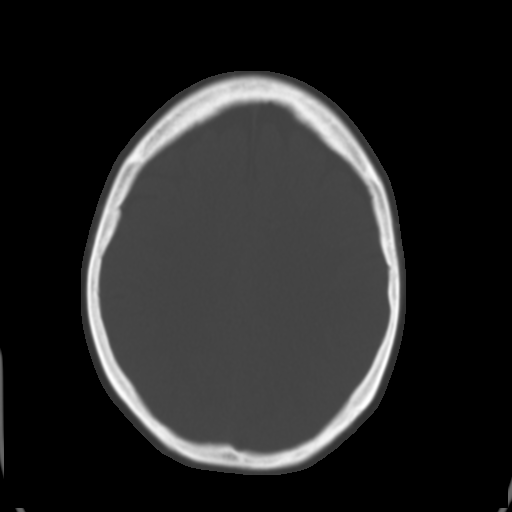
[im 23/31  brain]
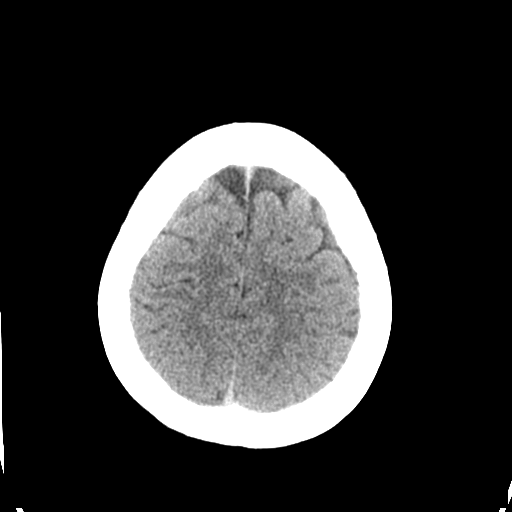
[im 27/31  brain]
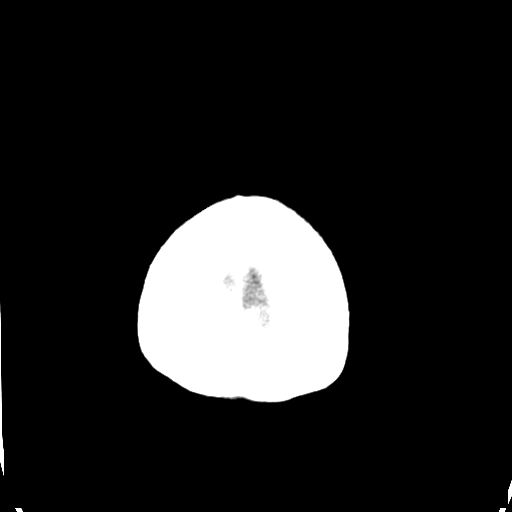

[Series 3: ax head bone · axial · 0.33mm/px · 1 of 75 slices shown]
[im 8/75  bone]
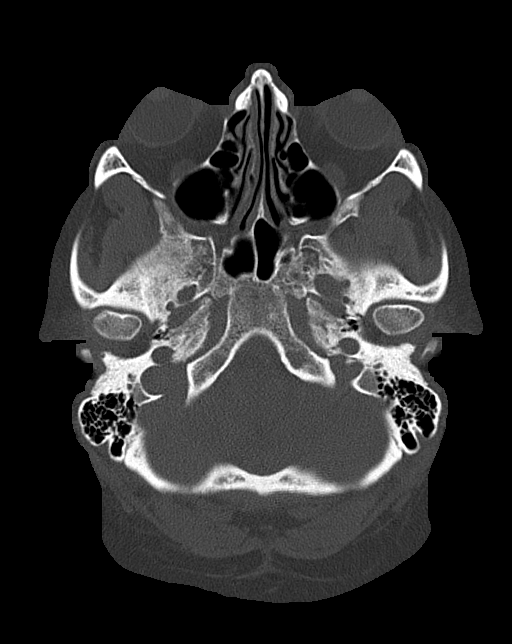

[Series 5: coronal soft tissue · coronal · 0.30mm/px · 3 of 65 slices shown]
[im 22/65  brain]
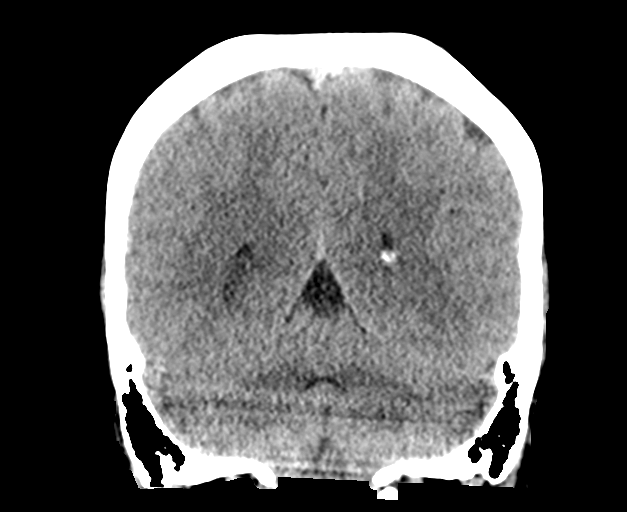
[im 29/65  brain]
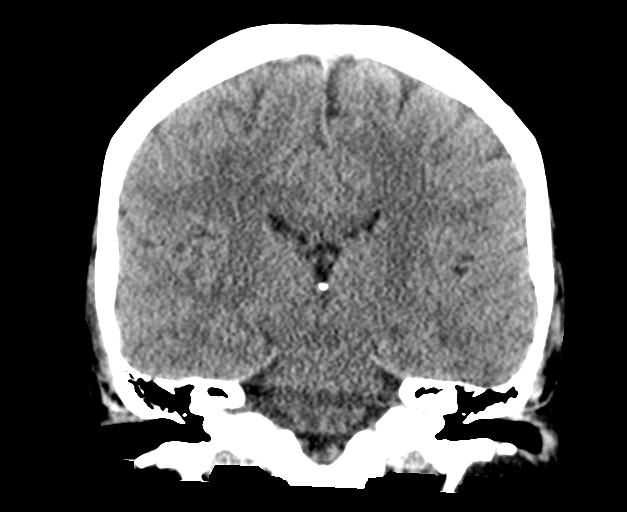
[im 36/65  brain]
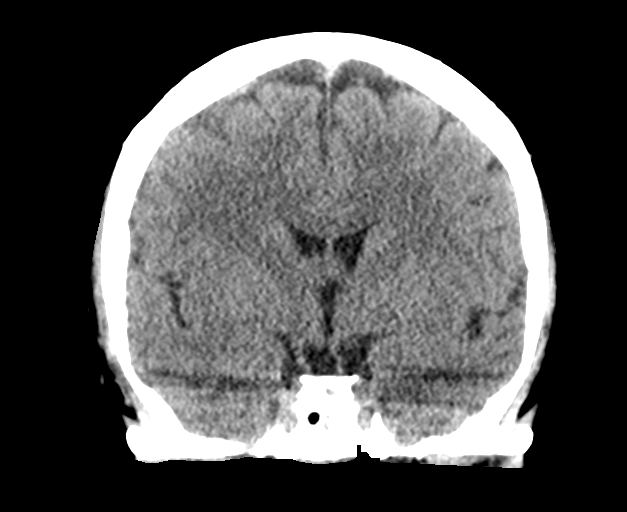

[Series 6: sagittal soft tissue · sagittal · 0.31mm/px · 3 of 51 slices shown]
[im 17/51  brain]
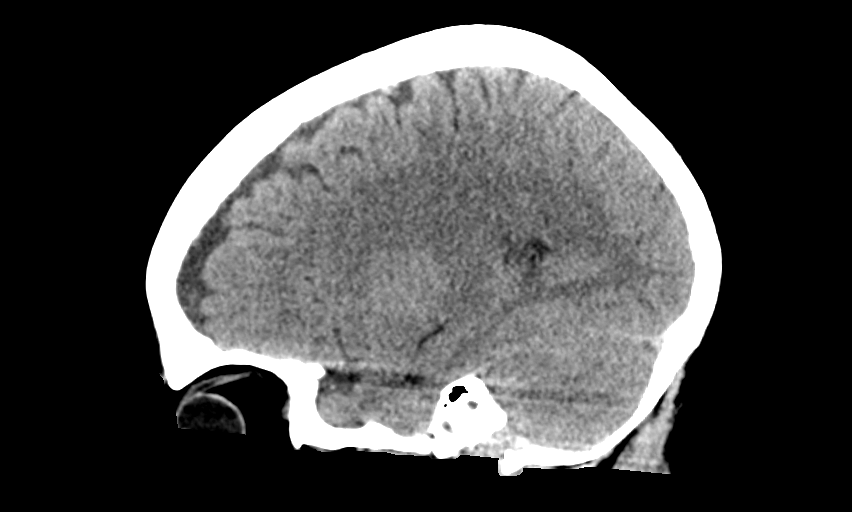
[im 26/51  brain]
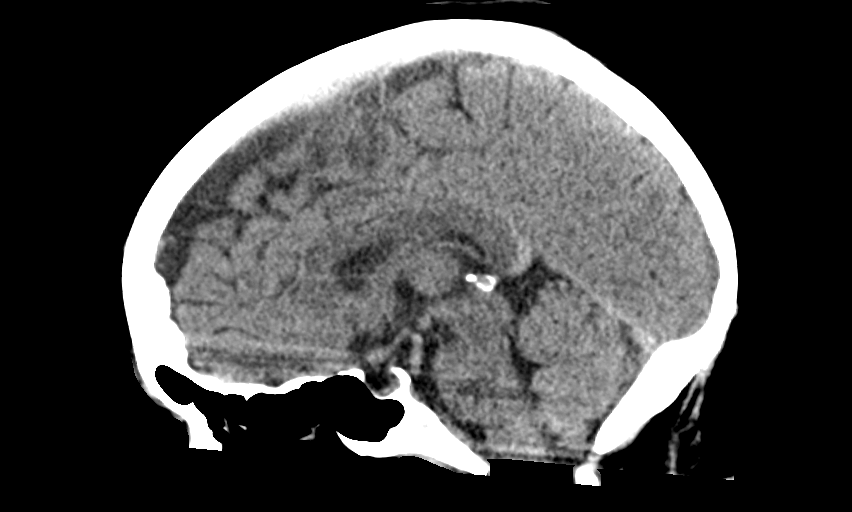
[im 34/51  brain]
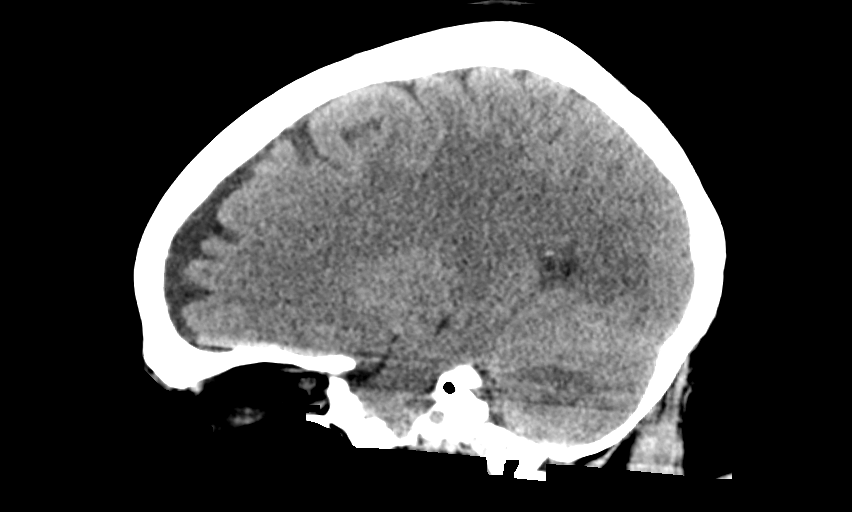

[14 of 47 positions shown; findings below may reference images not displayed]

FINDINGS: CT HEAD FINDINGS

Brain: Mild atrophy. Normal ventricular morphology. No midline shift
or mass effect. Otherwise normal appearance of brain parenchyma. No
intracranial hemorrhage, mass lesion or evidence of acute
infarction. No extra-axial fluid collections.

Vascular: No hyperdense vessels

Skull: Intact

Sinuses/Orbits: Clear

Other: N/A

CT CERVICAL SPINE FINDINGS

Alignment: Normal

Skull base and vertebrae: Osseous mineralization normal. Skull base
intact. Vertebral body and disc space heights maintained. No
fracture, subluxation, or bone destruction.

Soft tissues and spinal canal: Prevertebral soft tissues normal
thickness.

Disc levels:  No specific abnormalities

Upper chest: Lung apices clear

Other: N/A
IMPRESSION: Mild generalized atrophy.

No acute intracranial abnormalities.

Normal CT cervical spine.

## 2020-09-24 NOTE — ED Triage Notes (Signed)
Pt reports that she has had a migraine with dizziness especially when she stands up. She is also is having neck pain. She report that she fell in the bathtub last week and hit her head in the bathtub. She has been hurting ever since.

## 2021-01-20 ENCOUNTER — Other Ambulatory Visit: Payer: Self-pay

## 2021-01-20 ENCOUNTER — Ambulatory Visit
Admission: EM | Admit: 2021-01-20 | Discharge: 2021-01-20 | Disposition: A | Discharge status: Home / Self Care | Payer: Medicare Other | Attending: Emergency Medicine | Admitting: Emergency Medicine

## 2021-01-20 DIAGNOSIS — J069 Acute upper respiratory infection, unspecified: Secondary | ICD-10-CM | POA: Diagnosis not present

## 2021-01-20 MED ORDER — BENZONATATE 100 MG PO CAPS: 100.0000 mg | ORAL_CAPSULE | Freq: Three times a day (TID) | ORAL | 0 refills | Status: DC

## 2021-01-20 MED ORDER — PROMETHAZINE-DM 6.25-15 MG/5ML PO SYRP: 5.0000 mL | ORAL_SOLUTION | Freq: Four times a day (QID) | ORAL | 0 refills | Status: DC | PRN

## 2021-01-20 MED ORDER — GUAIFENESIN ER 600 MG PO TB12: 600.0000 mg | ORAL_TABLET | Freq: Two times a day (BID) | ORAL | 0 refills | Status: AC

## 2021-01-20 NOTE — ED Triage Notes (Signed)
Patient is here today for "Cough". Started "since last week". Recent procedure "Endoscopy?". Sore throat since with Cough just after. This past weekend, Congestion, Cough, Runny nose all much worse". No fever. @ home COVID19 test "Negative". COVID19 vaccines done. Flu vaccine done.

## 2021-01-20 NOTE — ED Provider Notes (Signed)
[facility]    CSN: [id number] Arrival date & time: 01/20/21  1109      History   Chief Complaint Chief Complaint  Patient presents with   Cough    HPI [name] is a 50 y.o. female.   Patient presents with chills, nasal congestion, sore throat, dry hacking cough and vomiting for 4 days.  Cough interfering with sleep. has attempted use of honey with lemon, tylenol, vitamins, prednisone which has not been helpful.  No known sick contacts.  History of anxiety, asthma, GERD, hypertension, hypothyroidism, prediabetes.  Denies fever, rhinorrhea, abdominal pain, nausea, diarrhea, shortness of breath, wheezing, headaches, ear pain.   Past Medical History:  Diagnosis Date   Anxiety    Asthma    Carpal tunnel syndrome    Depression    Disc degeneration, lumbosacral    GERD (gastroesophageal reflux disease)    Hypertension    Hypothyroidism    Migraine    Pre-diabetes    Sleep apnea    uses cpap   Thyroid disease     There are no problems to display for this patient.   Past Surgical History:  Procedure Laterality Date   CARPAL TUNNEL RELEASE     bilateral   CARPAL TUNNEL RELEASE Right 10/20/2017   Procedure: CARPAL TUNNEL RELEASE;  Surgeon: Leanor Kail, MD;  Location: [facility] ORS;  Service: Orthopedics;  Laterality: Right;   CESAREAN SECTION     EYE SURGERY     simple surgery, not cataract   FLAT FOOT CORRECTION Left 07/19/2020   Procedure: Evans/MCDO-L;  Surgeon: [name], DPM;  Location: [facility] OR[facility];  Service: Podiatry;  Laterality: Left;   FOOT SURGERY Left    screw in toe   GASTROC RECESSION EXTREMITY Left 07/19/2020   Procedure: GASTROC RECESSION EXTREMITY;  Surgeon: [name], DPM;  Location: [facility] OR[facility];  Service: Podiatry;  Laterality: Left;   LAPAROSCOPIC GASTRIC SLEEVE RESECTION  10/2019   TENDON TRANSFER Left 07/19/2020   Procedure: TENDON TRANSFER FDL transfer; Deep-L;  Surgeon: [name], DPM;  Location: [facility] OR[facility];   Service: Podiatry;  Laterality: Left;    OB History   No obstetric history on file.      Home Medications    Prior to Admission medications   Medication Sig Start Date End Date Taking? Authorizing Provider  albuterol (PROVENTIL HFA;VENTOLIN HFA) 108 (90 Base) MCG/ACT inhaler Inhale 2 puffs into the lungs every 8 (eight) hours as needed for wheezing or shortness of breath. Last used today, [id number], AM.   Yes [provider]  albuterol (PROVENTIL) (2.5 MG/3ML) 0.083% nebulizer solution Inhale 2.5 mg into the lungs every 6 (six) hours as needed for wheezing or shortness of breath. Last used today, [id number], AM. 05/17/20  Yes [provider]  budesonide-formoterol (SYMBICORT) 160-4.5 MCG/ACT inhaler Inhale 2 puffs into the lungs 2 (two) times daily.   Yes [provider]  hydrochlorothiazide (HYDRODIURIL) 25 MG tablet Take 25 mg by mouth in the morning.   Yes [provider]  levothyroxine (SYNTHROID) 112 MCG tablet Take 112 mcg by mouth daily before breakfast. 03/02/18  Yes [provider]  loratadine (CLARITIN) 10 MG tablet Take 10 mg by mouth in the morning.   Yes [provider]  montelukast (SINGULAIR) 10 MG tablet Take 10 mg by mouth at bedtime. 03/16/18  Yes [provider]  omeprazole (PRILOSEC) 40 MG capsule Take 40 mg by mouth in the morning.   Yes [provider]  venlafaxine (EFFEXOR) 37.5  MG tablet Take 37.5 mg by mouth in the morning, at noon, and at bedtime. 03/21/20  Yes [provider]  calcium carbonate (OSCAL) 1500 (600 Ca) MG TABS tablet Take 600 mg of elemental calcium by mouth daily with breakfast.    [provider]  carvedilol (COREG) 25 MG tablet Take 25 mg by mouth 2 (two) times daily with a meal.    [provider]  cholecalciferol (VITAMIN D3) 25 MCG (1000 UNIT) tablet Take 1,000 Units by mouth daily.    [provider]  clonazePAM (KLONOPIN) 1 MG tablet Take 1 mg  by mouth at bedtime. 07/15/17   [provider]  Cyanocobalamin (VITAMIN B-12 PO) Take 1 tablet by mouth daily.    [provider]  metFORMIN (GLUCOPHAGE-XR) 750 MG 24 hr tablet Take 750 mg by mouth 3 (three) times a week. 06/04/20   [provider]  Multiple Vitamins-Minerals (BARIATRIC MULTIVITAMINS/IRON PO) Take 1 tablet by mouth daily.    [provider]  nitroGLYCERIN (NITROSTAT) 0.4 MG SL tablet Place 0.4 mg under the tongue every 5 (five) minutes x 3 doses as needed for chest pain.    [provider]  oxyCODONE-acetaminophen (PERCOCET) 5-325 MG tablet Take 1-2 tablets by mouth every 6 (six) hours as needed for severe pain. Max 6 tabs per day 07/19/20   [name], DPM  oxyCODONE-acetaminophen (PERCOCET) 5-325 MG tablet Take 1-2 tablets by mouth every 6 (six) hours as needed for severe pain. Max 6 tabs per day 07/19/20   [name], DPM  Potassium Gluconate 550 MG TABS Take 550 mg by mouth daily with lunch.    [provider]  gabapentin (NEURONTIN) 400 MG capsule Take 400 mg by mouth every morning.  05/25/19  [provider]    Family History Family History  Problem Relation Age of Onset   Hypertension Mother    Depression Mother    Heart disease Mother    Thyroid disease Mother    Heart disease Father    Heart attack Father    Cancer Paternal Aunt    Cancer Paternal Uncle    Breast cancer Neg Hx     Social History Social History   Tobacco Use   Smoking status: Never   Smokeless tobacco: Never  Vaping Use   Vaping Use: Never used  Substance Use Topics   Alcohol use: Yes    Comment: rarely   Drug use: No     Allergies   Morphine and related   Review of Systems Review of Systems  Constitutional:  Positive for chills. Negative for activity change, appetite change, diaphoresis, fatigue, fever and unexpected weight change.  HENT:  Positive for congestion and sore throat. Negative for dental problem,  drooling, ear discharge, ear pain, facial swelling, hearing loss, mouth sores, nosebleeds, postnasal drip, rhinorrhea, sinus pressure, sinus pain, sneezing, tinnitus, trouble swallowing and voice change.   Respiratory:  Positive for cough. Negative for apnea, choking, chest tightness, shortness of breath, wheezing and stridor.   Cardiovascular: Negative.   Gastrointestinal:  Positive for vomiting. Negative for abdominal distention, abdominal pain, anal bleeding, blood in stool, constipation, diarrhea, nausea and rectal pain.  Skin: Negative.   Neurological: Negative.     Physical Exam Triage Vital Signs ED Triage Vitals  Enc Vitals Group     BP 01/20/21 1149 118/78     Pulse Rate 01/20/21 1149 75     Resp 01/20/21 1149 20     Temp 01/20/21 1149 98.2 F (  36.8 C)     Temp Source 01/20/21 1149 Oral     SpO2 01/20/21 1149 97 %     Weight 01/20/21 1148 181 lb 8 oz (82.3 kg)     Height --      Head Circumference --      Peak Flow --      Pain Score 01/20/21 1148 0     Pain Loc --      Pain Edu? --      Excl. in [place]? --    No data found.  Updated Vital Signs BP 118/78 (BP Location: Left Arm)    Pulse 75    Temp 98.2 F (36.8 C) (Oral)    Resp 20    Wt 181 lb 8 oz (82.3 kg)    LMP 05/17/2018    SpO2 97%    BMI 34.29 kg/m   Visual Acuity Right Eye Distance:   Left Eye Distance:   Bilateral Distance:    Right Eye Near:   Left Eye Near:    Bilateral Near:     Physical Exam Constitutional:      Appearance: Normal appearance. She is normal weight.  HENT:     Head: Normocephalic.     Right Ear: Tympanic membrane, ear canal and external ear normal.     Left Ear: Tympanic membrane, ear canal and external ear normal.     Nose: Congestion and rhinorrhea present.     Mouth/Throat:     Mouth: Mucous membranes are moist.     Pharynx: Posterior oropharyngeal erythema present.  Eyes:     Extraocular Movements: Extraocular movements intact.  Cardiovascular:     Rate and Rhythm:  Normal rate and regular rhythm.     Heart sounds: Normal heart sounds.  Pulmonary:     Effort: Pulmonary effort is normal.     Breath sounds: Normal breath sounds.  Musculoskeletal:     Cervical back: Normal range of motion and neck supple.  Skin:    General: Skin is warm and dry.  Neurological:     Mental Status: She is alert and oriented to person, place, and time. Mental status is at baseline.  Psychiatric:        Mood and Affect: Mood normal.        Behavior: Behavior normal.     UC Treatments / Results  Labs (all labs ordered are listed, but only abnormal results are displayed) Labs Reviewed - No data to display  EKG   Radiology No results found.  Procedures Procedures (including critical care time)  Medications Ordered in UC Medications - No data to display  Initial Impression / Assessment and Plan / UC Course  I have reviewed the triage vital signs and the nursing notes.  Pertinent labs & imaging results that were available during my care of the patient were reviewed by me and considered in my medical decision making (see chart for details).  Viral URI with cough  Discussed etiology of symptoms, timeline and possible resolution, most worrisome symptom today is congestion and cough, will attempt to manage  1.  Tessalon 100 mg 3 times daily as needed 2.  Promethazine DM 6.25-15 mg / 5 mL every 6 6 hours as needed 3.  Guaifenesin 600 mg twice daily as needed 4.  Urgent care follow-up as needed Final Clinical Impressions(s) / UC Diagnoses   Final diagnoses:  None   Discharge Instructions   None    ED Prescriptions   None  PDMP not reviewed this encounter.   Hans Eden, NP 01/20/21 1339

## 2021-01-20 NOTE — Discharge Instructions (Signed)
Your symptoms today are most likely being caused by a virus and should steadily improve in time it can take up to 7 to 10 days before you truly start to see a turnaround however things will get better    You can take Tylenol and/or Ibuprofen as needed for fever reduction and pain relief.   For cough: honey 1/2 to 1 teaspoon (you can dilute the honey in water or another fluid).  . You can use a humidifier for chest congestion and cough.  If you don't have a humidifier, you can sit in the bathroom with the hot shower running.      For sore throat: try warm salt water gargles, cepacol lozenges, throat spray, warm tea or water with lemon/honey, popsicles or ice, or OTC cold relief medicine for throat discomfort.   For congestion: take a daily anti-histamine like Zyrtec, Claritin, and a oral decongestant, such as pseudoephedrine.  You can also use Flonase 1-2 sprays in each nostril daily.   It is important to stay hydrated: drink plenty of fluids (water, gatorade/powerade/pedialyte, juices, or teas) to keep your throat moisturized and help further relieve irritation/discomfort.

## 2021-02-10 ENCOUNTER — Other Ambulatory Visit: Payer: Self-pay

## 2021-02-10 ENCOUNTER — Emergency Department: Payer: Medicare Other

## 2021-02-10 DIAGNOSIS — R42 Dizziness and giddiness: Secondary | ICD-10-CM | POA: Diagnosis present

## 2021-02-10 DIAGNOSIS — Z5321 Procedure and treatment not carried out due to patient leaving prior to being seen by health care provider: Secondary | ICD-10-CM | POA: Insufficient documentation

## 2021-02-10 DIAGNOSIS — Z20822 Contact with and (suspected) exposure to covid-19: Secondary | ICD-10-CM | POA: Insufficient documentation

## 2021-02-10 LAB — CBC WITH DIFFERENTIAL/PLATELET
Abs Immature Granulocytes: 0.01 10*3/uL (ref 0.00–0.07)
Basophils Absolute: 0 10*3/uL (ref 0.0–0.1)
Basophils Relative: 0 %
Eosinophils Absolute: 0.1 10*3/uL (ref 0.0–0.5)
Eosinophils Relative: 1 %
HCT: 43.6 % (ref 36.0–46.0)
Hemoglobin: 15.3 g/dL — ABNORMAL HIGH (ref 12.0–15.0)
Immature Granulocytes: 0 %
Lymphocytes Relative: 27 %
Lymphs Abs: 2.2 10*3/uL (ref 0.7–4.0)
MCH: 32.8 pg (ref 26.0–34.0)
MCHC: 35.1 g/dL (ref 30.0–36.0)
MCV: 93.6 fL (ref 80.0–100.0)
Monocytes Absolute: 0.8 10*3/uL (ref 0.1–1.0)
Monocytes Relative: 9 %
Neutro Abs: 5.2 10*3/uL (ref 1.7–7.7)
Neutrophils Relative %: 63 %
Platelets: 394 10*3/uL (ref 150–400)
RBC: 4.66 MIL/uL (ref 3.87–5.11)
RDW: 13.1 % (ref 11.5–15.5)
WBC: 8.3 10*3/uL (ref 4.0–10.5)
nRBC: 0 % (ref 0.0–0.2)

## 2021-02-10 LAB — COMPREHENSIVE METABOLIC PANEL
ALT: 15 U/L (ref 0–44)
AST: 13 U/L — ABNORMAL LOW (ref 15–41)
Albumin: 3.9 g/dL (ref 3.5–5.0)
Alkaline Phosphatase: 74 U/L (ref 38–126)
Anion gap: 9 (ref 5–15)
BUN: 15 mg/dL (ref 6–20)
CO2: 28 mmol/L (ref 22–32)
Calcium: 9.4 mg/dL (ref 8.9–10.3)
Chloride: 97 mmol/L — ABNORMAL LOW (ref 98–111)
Creatinine, Ser: 0.66 mg/dL (ref 0.44–1.00)
GFR, Estimated: >60 mL/min (ref >60)
Glucose, Bld: 102 mg/dL — ABNORMAL HIGH (ref 70–99)
Potassium: 3.5 mmol/L (ref 3.5–5.1)
Sodium: 134 mmol/L — ABNORMAL LOW (ref 135–145)
Total Bilirubin: 0.6 mg/dL (ref 0.3–1.2)
Total Protein: 7.5 g/dL (ref 6.5–8.1)

## 2021-02-10 LAB — URINALYSIS, ROUTINE W REFLEX MICROSCOPIC
Bilirubin Urine: NEGATIVE
Glucose, UA: NEGATIVE mg/dL
Hgb urine dipstick: NEGATIVE
Ketones, ur: NEGATIVE mg/dL
Leukocytes,Ua: NEGATIVE
Nitrite: NEGATIVE
Protein, ur: NEGATIVE mg/dL
Specific Gravity, Urine: 1.01 (ref 1.005–1.030)
pH: 6 (ref 5.0–8.0)

## 2021-02-10 LAB — TROPONIN I (HIGH SENSITIVITY)
Troponin I (High Sensitivity): <2 ng/L (ref <18)
Troponin I (High Sensitivity): 3 ng/L (ref <18)

## 2021-02-10 LAB — RESP PANEL BY RT-PCR (FLU A&B, COVID) ARPGX2
Influenza A by PCR: NEGATIVE
Influenza B by PCR: NEGATIVE
SARS Coronavirus 2 by RT PCR: NEGATIVE

## 2021-02-10 LAB — C-REACTIVE PROTEIN: CRP: 0.5 mg/dL (ref <1.0)

## 2021-02-10 LAB — SEDIMENTATION RATE: Sed Rate: 15 mm/hr (ref 0–20)

## 2021-02-10 IMAGING — CR DG CHEST 1V
1 series · 1 of 1 positions shown · non-contrast
Comparison: None.

CLINICAL DATA: Dizziness.

EXAM:
CHEST  1 VIEW

[dg chest 1 view]
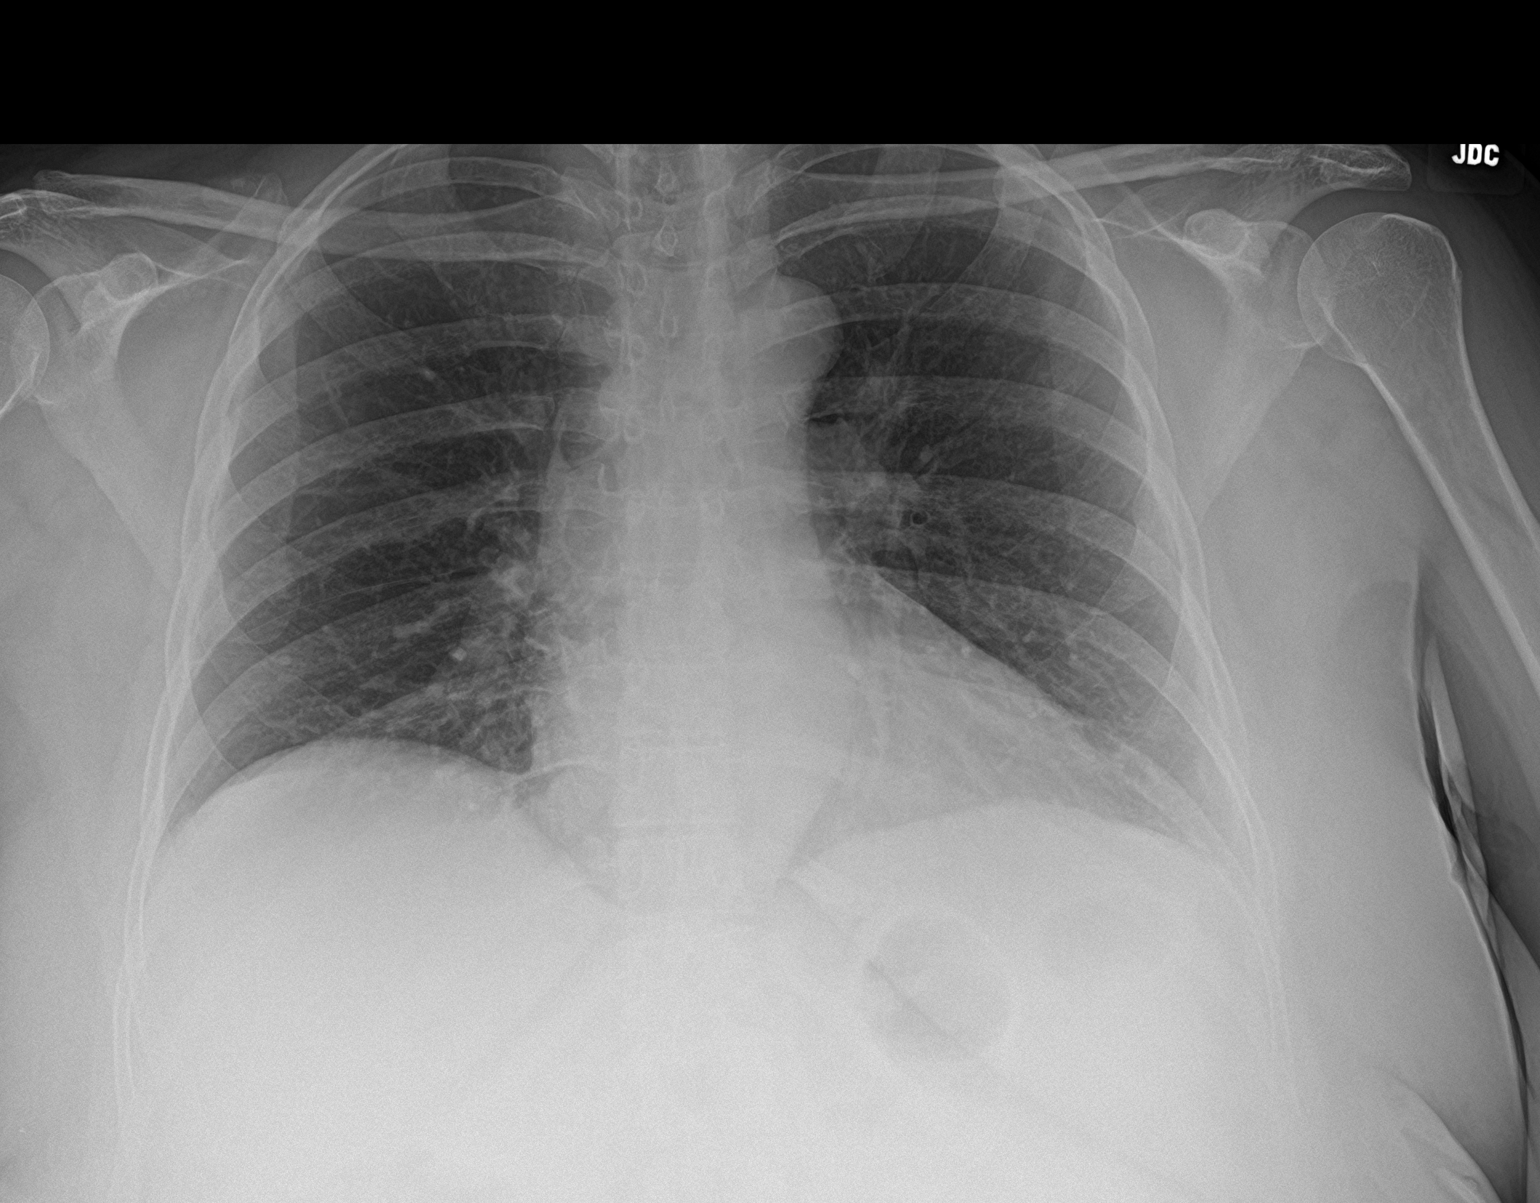

[1 of 1 positions shown; findings below may reference images not displayed]

FINDINGS: The heart size and mediastinal contours are within normal limits.
Both lungs are clear. The visualized skeletal structures are
unremarkable.
IMPRESSION: No active disease.

## 2021-02-10 IMAGING — CT CT HEAD W/O CM
4 series · 17 of 47 positions shown, 19 images · non-contrast
Comparison: [DATE]

CLINICAL DATA: Dizziness starting at [DATE] p.m. pain along the left
side of the head. Loss of balance. Nexium [DATE]

EXAM:
CT HEAD WITHOUT CONTRAST
TECHNIQUE: Contiguous axial images were obtained from the base of the skull
through the vertex without intravenous contrast.

[Series 2: head bone · axial · 0.46mm/px · z∈[-132,-80]mm · 4 of 75 slices shown]
[im 8/75  bone]
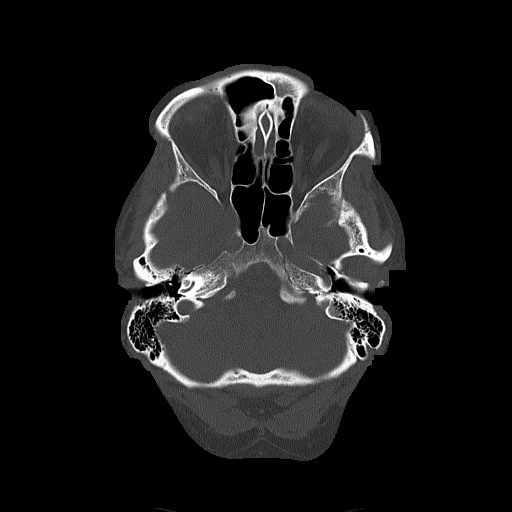
[im 15/75  bone]
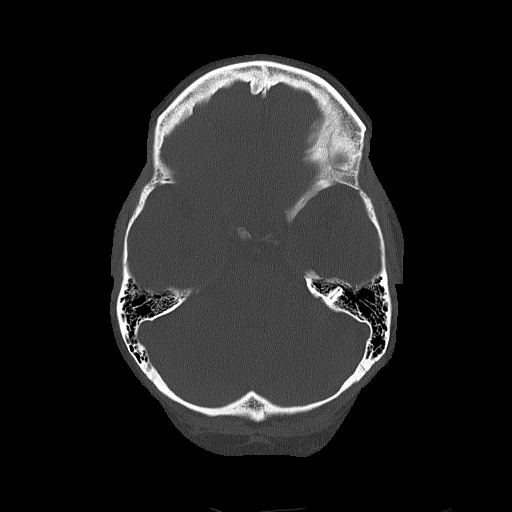
[im 23/75  bone]
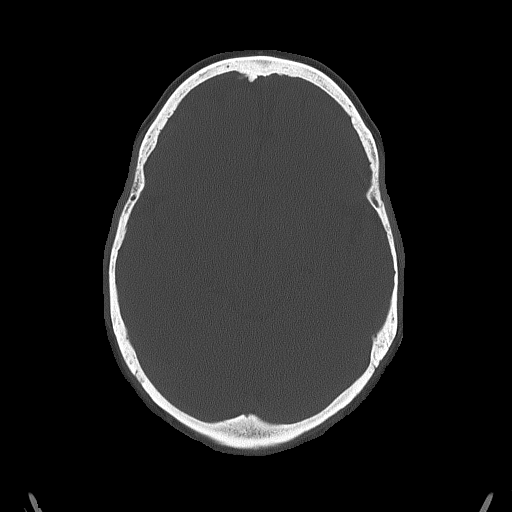
[im 34/75  bone]
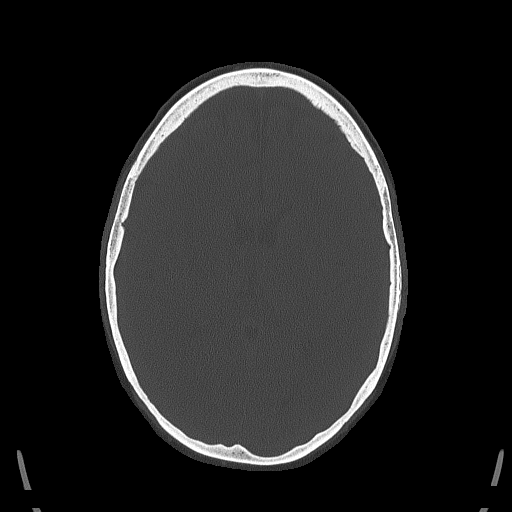

[Series 3: head wo · axial · 0.46mm/px · z∈[-131,-21]mm · 7 of 30 slices shown, 9 images]
[im 4/30  brain]
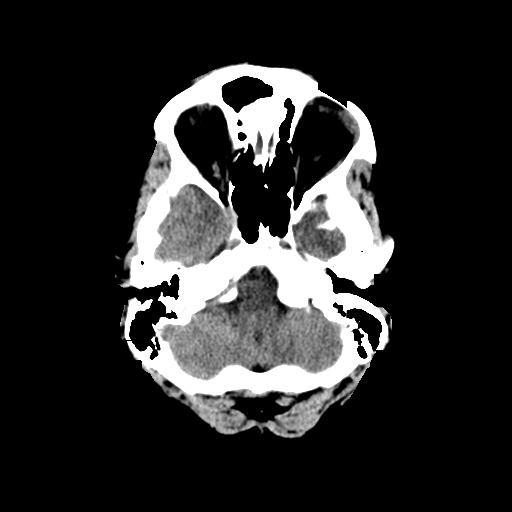
[im 4/30  bone]
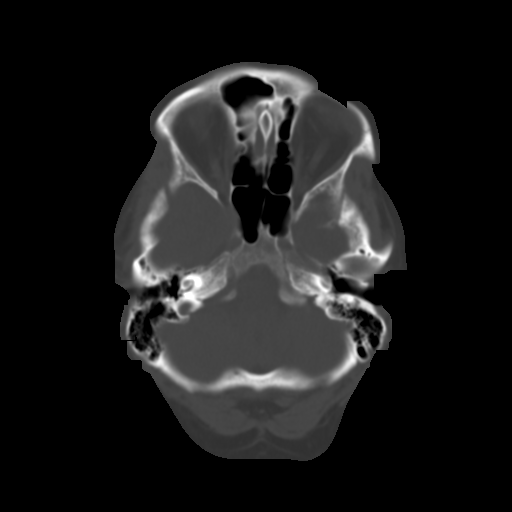
[im 8/30  brain]
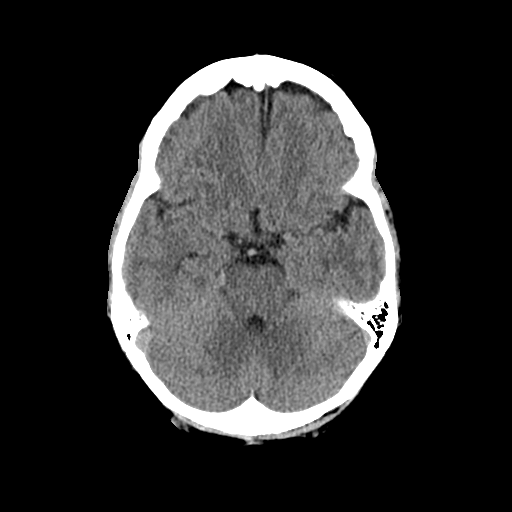
[im 11/30  brain]
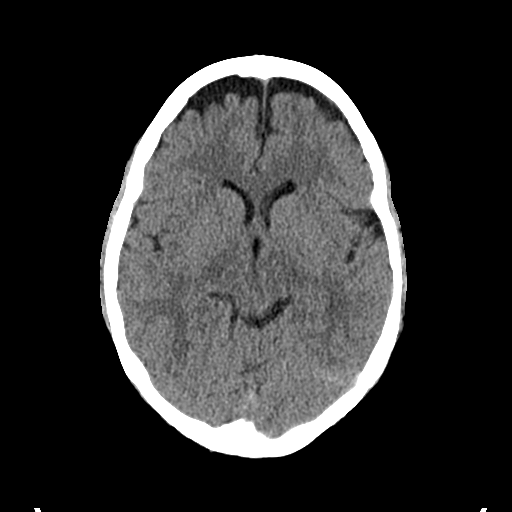
[im 15/30  brain]
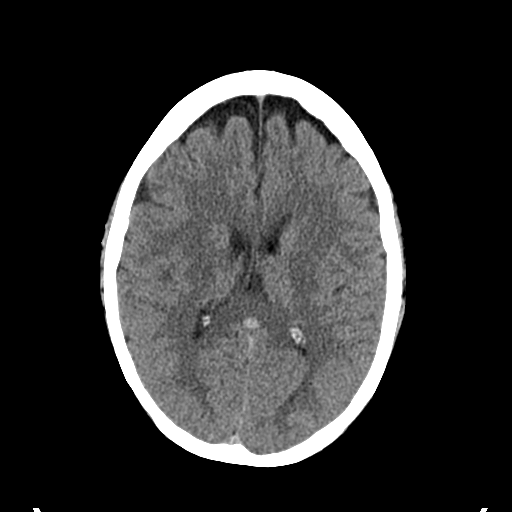
[im 19/30  brain]
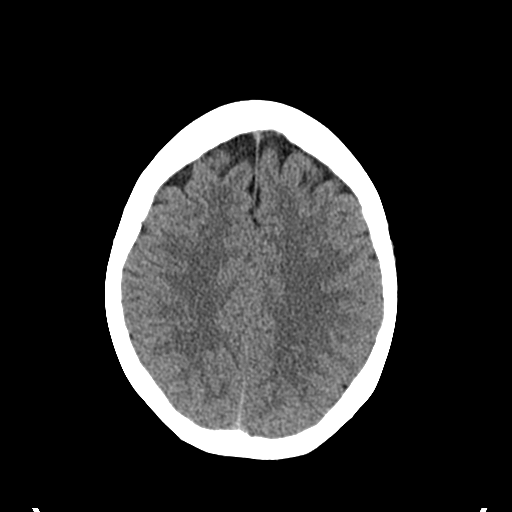
[im 19/30  bone]
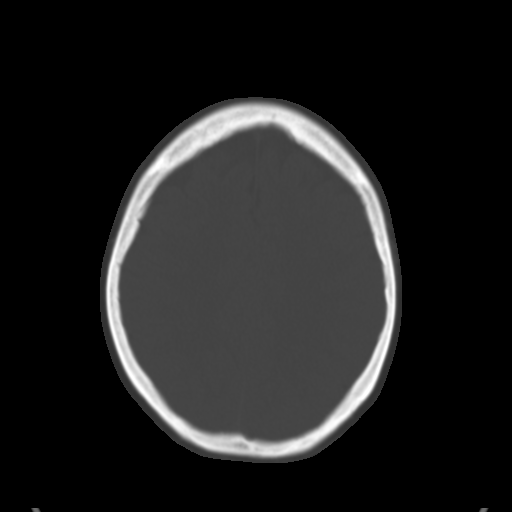
[im 22/30  brain]
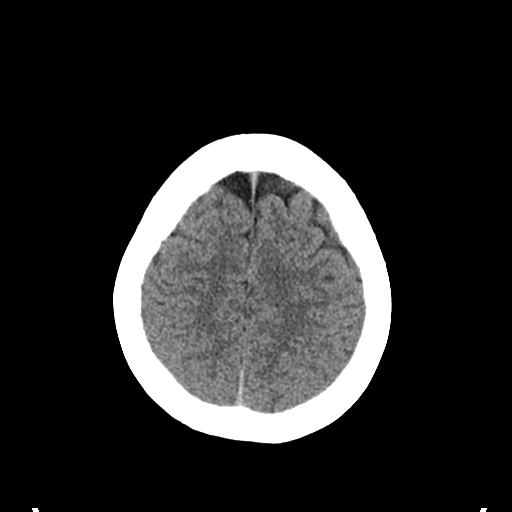
[im 26/30  brain]
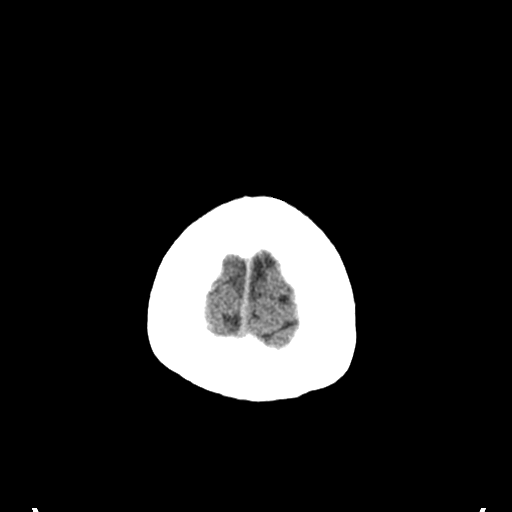

[Series 4: coronal soft tissue · coronal · 0.31mm/px · 3 of 65 slices shown]
[im 22/65  brain]
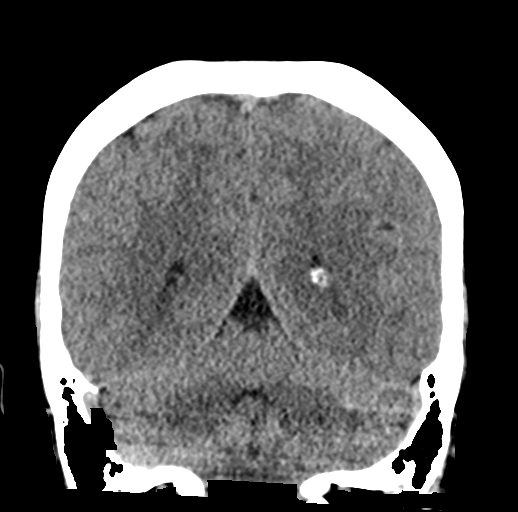
[im 29/65  brain]
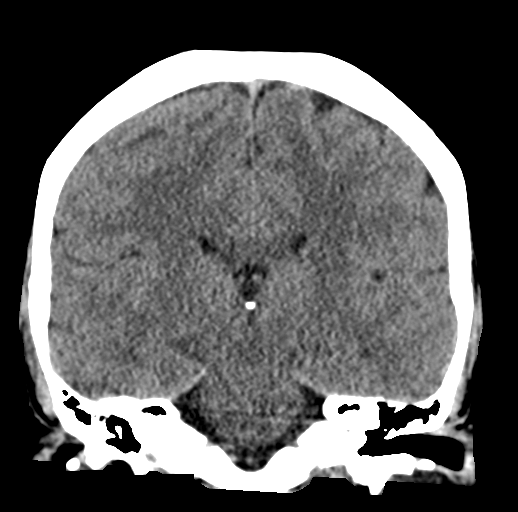
[im 36/65  brain]
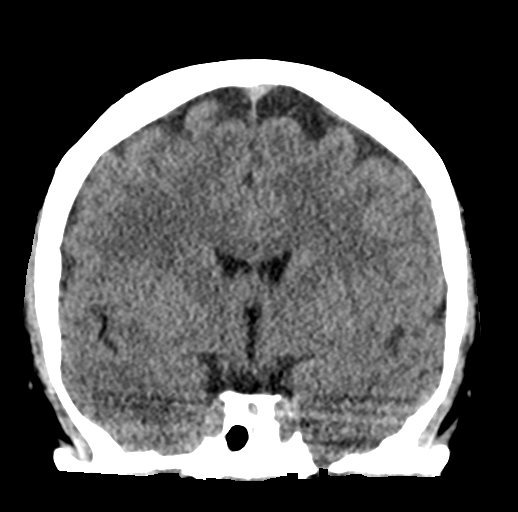

[Series 5: sagittal soft tissue · sagittal · 0.31mm/px · 3 of 54 slices shown]
[im 18/54  brain]
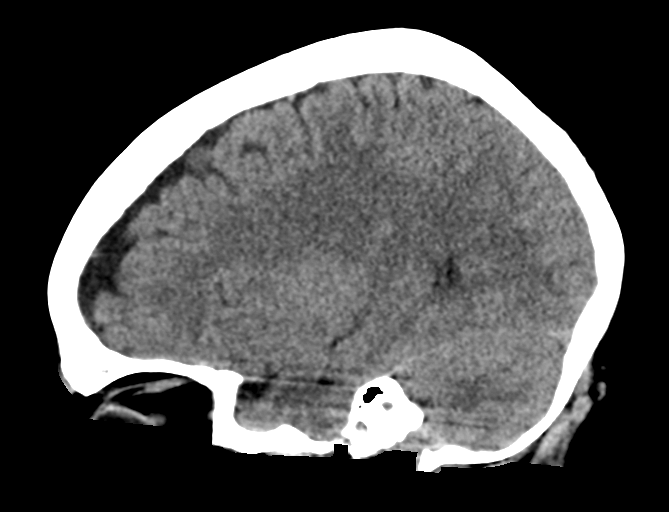
[im 27/54  brain]
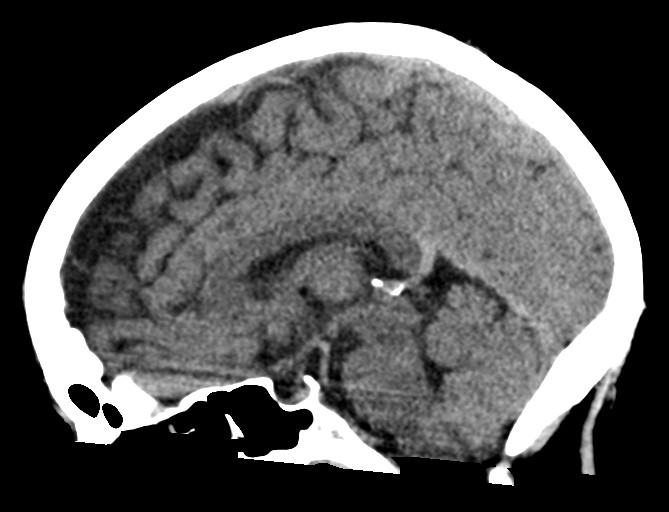
[im 36/54  brain]
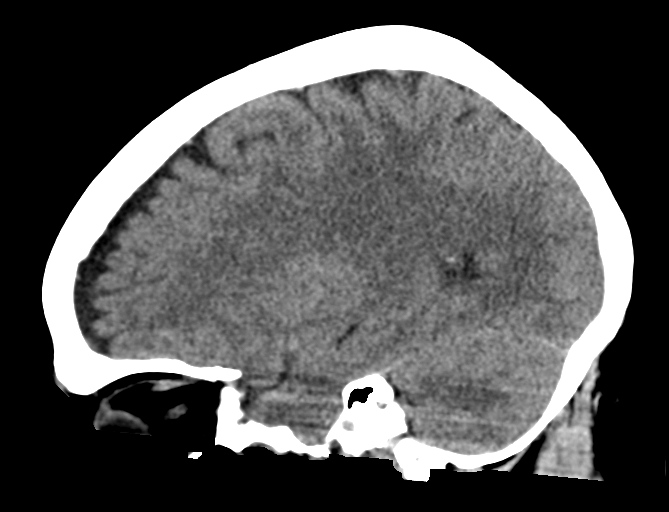

[17 of 47 positions shown; findings below may reference images not displayed]

FINDINGS: Brain: Stable mild bifrontal atrophy. Otherwise, the brainstem,
cerebellum, cerebral peduncles, thalamus, basal ganglia, basilar
cisterns, and ventricular system appear within normal limits. No
intracranial hemorrhage, mass lesion, or acute CVA.

Vascular: Unremarkable

Skull: Unremarkable

Sinuses/Orbits: Unremarkable where included.

Other: No supplemental non-categorized findings.
IMPRESSION: 1. No acute intracranial findings or specific explanation for the
patient's recent symptoms.

## 2021-02-10 NOTE — ED Triage Notes (Signed)
Pt here with dizziness that started at 1345. Pt has not had this feeling before and is having pain on the left side of her head. Pt states she tried to stand up and could not get her balance. Pt in NAD in triage.

## 2021-02-10 NOTE — ED Provider Triage Note (Signed)
Emergency Medicine Provider Triage Evaluation Note  [name] [place] , a 51 y.o. female  was evaluated in triage.  Pt complains of dizziness and sharp shooting pain in left temple area. Also has pain in left shoulder. Sent from [facility] for workup..  Review of Systems  Positive: Headache dizziness Negative: Fever  Physical Exam  BP (!) 154/95 (BP Location: Right Arm)    Pulse 74    Temp 98 F (36.7 C) (Oral)    Resp 16    Ht 5' (1.524 m)    Wt 82.1 kg    LMP 05/17/2018    SpO2 94%    BMI 35.35 kg/m  Gen:   Awake, no distress   Resp:  Normal effort  MSK:   Moves extremities without difficulty  Other:    Medical Decision Making  Medically screening exam initiated at 3:08 PM.  Appropriate orders placed.  [name] [place] was informed that the remainder of the evaluation will be completed by another provider, this initial triage assessment does not replace that evaluation, and the importance of remaining in the ED until their evaluation is complete.    Victorino Dike, [place] 02/10/21 [phone]

## 2021-02-11 ENCOUNTER — Emergency Department
Admission: EM | Admit: 2021-02-11 | Discharge: 2021-02-11 | Discharge status: Against Medical Advice | Payer: Medicare Other | Attending: Family Medicine | Admitting: Family Medicine

## 2021-02-11 NOTE — ED Notes (Signed)
No answer when called several times from lobby 

## 2021-02-11 NOTE — ED Notes (Signed)
No answer in lobby when called for repeat vitals °

## 2021-02-20 ENCOUNTER — Encounter: Payer: Self-pay | Admitting: Gastroenterology

## 2021-02-20 NOTE — H&P (Signed)
Pre-Procedure H&P   Patient ID: [name] is a 51 y.o. female.  Gastroenterology Provider: Annamaria Helling, DO  Referring Provider: Dr. [name] Groom PCP: Valera Castle, MD  Date: 02/21/2021  HPI [name] is a 51 y.o. female who presents today for Colonoscopy for initial screening colonoscopy.  Patient with normal bowel movements without blood or melena.  She has a positive family history for colon polyps in her mother- pt reports "many."  Latest lab data creatinine 0.4 hemoglobin 14.3 MCV 91 platelets 115,000.  Uses CPAP for obstructive sleep apnea.  No other acute GI complaints   Past Medical History:  Diagnosis Date   Anxiety    Asthma    Bronchitis, chronic (HCC)    Carpal tunnel syndrome    COPD (chronic obstructive pulmonary disease) (HCC)    Depression    Disc degeneration, lumbosacral    GERD (gastroesophageal reflux disease)    Hypertension    Hypothyroidism    Migraine    Osteoporosis    Pre-diabetes    Sleep apnea    uses cpap   Thyroid disease     Past Surgical History:  Procedure Laterality Date   CARPAL TUNNEL RELEASE     bilateral   CARPAL TUNNEL RELEASE Right 10/20/2017   Procedure: CARPAL TUNNEL RELEASE;  Surgeon: Leanor Kail, MD;  Location: [facility] ORS;  Service: Orthopedics;  Laterality: Right;   CESAREAN SECTION     EYE SURGERY     simple surgery, not cataract   FLAT FOOT CORRECTION Left 07/19/2020   Procedure: Evans/MCDO-L;  Surgeon: [name], DPM;  Location: [facility] ORS;  Service: Podiatry;  Laterality: Left;   FOOT SURGERY Left    screw in toe   GASTROC RECESSION EXTREMITY Left 07/19/2020   Procedure: GASTROC RECESSION EXTREMITY;  Surgeon: [name], DPM;  Location: [facility] ORS;  Service: Podiatry;  Laterality: Left;   LAPAROSCOPIC GASTRIC SLEEVE RESECTION  10/2019   TENDON TRANSFER Left 07/19/2020   Procedure: TENDON TRANSFER FDL transfer; Deep-L;  Surgeon: [name], DPM;  Location: [facility]  ORS;  Service: Podiatry;  Laterality: Left;    Family History Mother with colon polyps.  No other h/o GI disease or malignancy  Review of Systems  Constitutional:  Negative for activity change, appetite change, chills, diaphoresis, fatigue, fever and unexpected weight change.  HENT:  Negative for trouble swallowing and voice change.   Respiratory:  Negative for shortness of breath and wheezing.   Cardiovascular:  Negative for chest pain, palpitations and leg swelling.  Gastrointestinal:  Negative for abdominal distention, abdominal pain, anal bleeding, blood in stool, constipation, diarrhea, nausea, rectal pain and vomiting.  Musculoskeletal:  Negative for arthralgias and myalgias.  Skin:  Negative for color change and pallor.  Neurological:  Negative for dizziness, syncope and weakness.  Psychiatric/Behavioral:  Negative for confusion.   All other systems reviewed and are negative.   Medications No current facility-administered medications on file prior to encounter.   Current Outpatient Medications on File Prior to Encounter  Medication Sig Dispense Refill   acetaminophen (TYLENOL) 160 MG/5ML liquid Take 976 mg by mouth every 4 (four) hours as needed for fever.     carvedilol (COREG) 25 MG tablet Take 25 mg by mouth 2 (two) times daily with a meal.     cyclobenzaprine (FLEXERIL) 10 MG tablet Take 10 mg by mouth 3 (three) times daily as needed for muscle spasms.     Ergocalciferol (VITAMIN D2 PO) Take by mouth once a  week.     famotidine (PEPCID) 40 MG tablet Take 40 mg by mouth at bedtime.     ferrous sulfate 325 (65 FE) MG tablet Take 325 mg by mouth daily with breakfast.     FLUoxetine (PROZAC) 10 MG tablet Take 10 mg by mouth daily.     fluticasone (FLONASE) 50 MCG/ACT nasal spray Place 2 sprays into both nostrils daily.     hyoscyamine (LEVSIN SL) 0.125 MG SL tablet Place 0.125 mg under the tongue every 4 (four) hours as needed.     levalbuterol (XOPENEX HFA) 45 MCG/ACT inhaler  Inhale 2 puffs into the lungs every 4 (four) hours as needed for wheezing.     medroxyPROGESTERone (PROVERA) 10 MG tablet Take 10 mg by mouth daily.     MM MELATONIN PO Take by mouth.     nystatin ointment (MYCOSTATIN) Apply 1 application topically 2 (two) times daily.     pregabalin (LYRICA) 25 MG capsule Take 25 mg by mouth 2 (two) times daily.     Tiotropium Bromide Monohydrate (SPIRIVA RESPIMAT) 1.25 MCG/ACT AERS Inhale into the lungs.     albuterol (PROVENTIL HFA;VENTOLIN HFA) 108 (90 Base) MCG/ACT inhaler Inhale 2 puffs into the lungs every 8 (eight) hours as needed for wheezing or shortness of breath. Last used today, [id number], AM.     albuterol (PROVENTIL) (2.5 MG/3ML) 0.083% nebulizer solution Inhale 2.5 mg into the lungs every 6 (six) hours as needed for wheezing or shortness of breath. Last used today, [id number], AM.     budesonide-formoterol (SYMBICORT) 160-4.5 MCG/ACT inhaler Inhale 2 puffs into the lungs 2 (two) times daily.     calcium carbonate (OSCAL) 1500 (600 Ca) MG TABS tablet Take 600 mg of elemental calcium by mouth daily with breakfast.     cholecalciferol (VITAMIN D3) 25 MCG (1000 UNIT) tablet Take 1,000 Units by mouth daily.     clonazePAM (KLONOPIN) 1 MG tablet Take 1 mg by mouth at bedtime.  1   Cyanocobalamin (VITAMIN B-12 PO) Take 1 tablet by mouth daily.     hydrochlorothiazide (HYDRODIURIL) 25 MG tablet Take 25 mg by mouth in the morning.     levothyroxine (SYNTHROID) 112 MCG tablet Take 112 mcg by mouth daily before breakfast.     loratadine (CLARITIN) 10 MG tablet Take 10 mg by mouth in the morning.     metFORMIN (GLUCOPHAGE-XR) 750 MG 24 hr tablet Take 750 mg by mouth 3 (three) times a week.     montelukast (SINGULAIR) 10 MG tablet Take 10 mg by mouth at bedtime.     Multiple Vitamins-Minerals (BARIATRIC MULTIVITAMINS/IRON PO) Take 1 tablet by mouth daily.     nitroGLYCERIN (NITROSTAT) 0.4 MG SL tablet Place 0.4 mg under the tongue every 5 (five) minutes x 3 doses  as needed for chest pain.     omeprazole (PRILOSEC) 40 MG capsule Take 40 mg by mouth in the morning.     oxyCODONE-acetaminophen (PERCOCET) 5-325 MG tablet Take 1-2 tablets by mouth every 6 (six) hours as needed for severe pain. Max 6 tabs per day 30 tablet 0   oxyCODONE-acetaminophen (PERCOCET) 5-325 MG tablet Take 1-2 tablets by mouth every 6 (six) hours as needed for severe pain. Max 6 tabs per day 30 tablet 0   Potassium Gluconate 550 MG TABS Take 550 mg by mouth daily with lunch.     venlafaxine (EFFEXOR) 37.5 MG tablet Take 37.5 mg by mouth in the morning, at noon, and at bedtime.     [  DISCONTINUED] gabapentin (NEURONTIN) 400 MG capsule Take 400 mg by mouth every morning.      Pertinent medications related to GI and procedure were reviewed by me with the patient prior to the procedure   Current Facility-Administered Medications:    0.9 %  sodium chloride infusion, , Intravenous, Continuous, Annamaria Helling, DO, Last Rate: 20 mL/hr at 02/21/21 1059, Continued from Pre-op at 02/21/21 1059      Allergies  Allergen Reactions   Morphine And Related Anaphylaxis    Pt reports ok to take oxycodone   Allergies were reviewed by me prior to the procedure  Objective    Vitals:   02/21/21 1024 02/21/21 1028  BP: 110/78   Pulse: 68   Resp: 16   Temp: (!) 96.7 F (35.9 C)   TempSrc: Temporal   SpO2: 96%   Weight:  80.7 kg  Height:  5' (1.524 m)     Physical Exam Vitals and nursing note reviewed.  Constitutional:      General: She is not in acute distress.    Appearance: Normal appearance. She is obese. She is not ill-appearing, toxic-appearing or diaphoretic.  HENT:     Head: Normocephalic and atraumatic.     Nose: Nose normal.     Mouth/Throat:     Mouth: Mucous membranes are moist.     Pharynx: Oropharynx is clear.  Eyes:     General: No scleral icterus.    Extraocular Movements: Extraocular movements intact.  Cardiovascular:     Rate and Rhythm: Normal rate  and regular rhythm.     Heart sounds: Normal heart sounds. No murmur heard.   No friction rub. No gallop.  Pulmonary:     Effort: Pulmonary effort is normal. No respiratory distress.     Breath sounds: Normal breath sounds. No wheezing, rhonchi or rales.  Abdominal:     General: Bowel sounds are normal. There is no distension.     Palpations: Abdomen is soft.     Tenderness: There is no abdominal tenderness. There is no guarding or rebound.  Musculoskeletal:     Cervical back: Neck supple.     Right lower leg: No edema.     Left lower leg: No edema.  Skin:    General: Skin is warm and dry.     Coloration: Skin is not jaundiced or pale.  Neurological:     General: No focal deficit present.     Mental Status: She is alert and oriented to person, place, and time. Mental status is at baseline.  Psychiatric:        Mood and Affect: Mood normal.        Behavior: Behavior normal.        Thought Content: Thought content normal.        Judgment: Judgment normal.     Assessment:  [name] is a 51 y.o. female  who presents today for Colonoscopy for initial screening colonoscopy.  Plan:  Colonoscopy with possible intervention today  Colonoscopy with possible biopsy, control of bleeding, polypectomy, and interventions as necessary has been discussed with the patient/patient representative. Informed consent was obtained from the patient/patient representative after explaining the indication, nature, and risks of the procedure including but not limited to death, bleeding, perforation, missed neoplasm/lesions, cardiorespiratory compromise, and reaction to medications. Opportunity for questions was given and appropriate answers were provided. Patient/patient representative has verbalized understanding is amenable to undergoing the procedure.   Annamaria Helling, DO  [facility]  Gastroenterology  Portions of the record may have been created with voice recognition  software. Occasional wrong-word or 'sound-a-like' substitutions may have occurred due to the inherent limitations of voice recognition software.  Read the chart carefully and recognize, using context, where substitutions may have occurred.

## 2021-02-21 ENCOUNTER — Encounter
Admission: RE | Disposition: A | Discharge status: Home / Self Care | Payer: Self-pay | Source: Home / Self Care | Attending: Gastroenterology

## 2021-02-21 ENCOUNTER — Encounter: Payer: Self-pay | Admitting: Gastroenterology

## 2021-02-21 ENCOUNTER — Ambulatory Visit
Admission: RE | Admit: 2021-02-21 | Discharge: 2021-02-21 | Disposition: A | Discharge status: Home / Self Care | Payer: Medicare Other | Attending: Gastroenterology | Admitting: Gastroenterology

## 2021-02-21 ENCOUNTER — Ambulatory Visit: Payer: Medicare Other | Admitting: Certified Registered Nurse Anesthetist

## 2021-02-21 DIAGNOSIS — K219 Gastro-esophageal reflux disease without esophagitis: Secondary | ICD-10-CM | POA: Diagnosis not present

## 2021-02-21 DIAGNOSIS — G4733 Obstructive sleep apnea (adult) (pediatric): Secondary | ICD-10-CM | POA: Diagnosis not present

## 2021-02-21 DIAGNOSIS — Z1211 Encounter for screening for malignant neoplasm of colon: Secondary | ICD-10-CM | POA: Diagnosis present

## 2021-02-21 DIAGNOSIS — J449 Chronic obstructive pulmonary disease, unspecified: Secondary | ICD-10-CM | POA: Diagnosis not present

## 2021-02-21 DIAGNOSIS — K635 Polyp of colon: Secondary | ICD-10-CM | POA: Diagnosis not present

## 2021-02-21 DIAGNOSIS — D123 Benign neoplasm of transverse colon: Secondary | ICD-10-CM | POA: Diagnosis not present

## 2021-02-21 DIAGNOSIS — G709 Myoneural disorder, unspecified: Secondary | ICD-10-CM | POA: Insufficient documentation

## 2021-02-21 DIAGNOSIS — I10 Essential (primary) hypertension: Secondary | ICD-10-CM | POA: Insufficient documentation

## 2021-02-21 DIAGNOSIS — Z8371 Family history of colonic polyps: Secondary | ICD-10-CM | POA: Diagnosis not present

## 2021-02-21 HISTORY — DX: Unspecified chronic bronchitis: J42

## 2021-02-21 HISTORY — PX: COLONOSCOPY WITH PROPOFOL: SHX5780

## 2021-02-21 HISTORY — DX: Chronic obstructive pulmonary disease, unspecified: J44.9

## 2021-02-21 HISTORY — DX: Age-related osteoporosis without current pathological fracture: M81.0

## 2021-02-21 LAB — GLUCOSE, CAPILLARY: Glucose-Capillary: 91 mg/dL (ref 70–99)

## 2021-02-21 LAB — POCT PREGNANCY, URINE: Preg Test, Ur: NEGATIVE

## 2021-02-21 SURGERY — COLONOSCOPY WITH PROPOFOL
Anesthesia: General

## 2021-02-21 MED ORDER — EPHEDRINE SULFATE (PRESSORS) 50 MG/ML IJ SOLN
INTRAMUSCULAR | Status: DC | PRN
  Administered 2021-02-21: 5 mg via INTRAVENOUS
  Administered 2021-02-21: 10 mg via INTRAVENOUS
  Administered 2021-02-21: 5 mg via INTRAVENOUS

## 2021-02-21 MED ORDER — SODIUM CHLORIDE 0.9 % IV SOLN: INTRAVENOUS | Status: DC

## 2021-02-21 MED ORDER — LIDOCAINE HCL (CARDIAC) PF 100 MG/5ML IV SOSY
PREFILLED_SYRINGE | INTRAVENOUS | Status: DC | PRN
  Administered 2021-02-21: 50 mg via INTRAVENOUS

## 2021-02-21 MED ORDER — PROPOFOL 500 MG/50ML IV EMUL
INTRAVENOUS | Status: DC | PRN
  Administered 2021-02-21: 200 ug/kg/min via INTRAVENOUS

## 2021-02-21 MED ORDER — PROPOFOL 10 MG/ML IV BOLUS
INTRAVENOUS | Status: DC | PRN
  Administered 2021-02-21: 70 mg via INTRAVENOUS

## 2021-02-21 NOTE — Anesthesia Preprocedure Evaluation (Signed)
Anesthesia Evaluation  Patient identified by MRN, date of birth, ID band Patient awake    Reviewed: Allergy & Precautions, NPO status , Patient's Chart, lab work & pertinent test results  History of Anesthesia Complications Negative for: history of anesthetic complications  Airway Mallampati: III  TM Distance: <3 FB Neck ROM: full    Dental  (+) Chipped, Poor Dentition, Missing   Pulmonary neg shortness of breath, asthma , sleep apnea , COPD,    Pulmonary exam normal        Cardiovascular hypertension, Normal cardiovascular exam     Neuro/Psych  Headaches, PSYCHIATRIC DISORDERS  Neuromuscular disease negative psych ROS   GI/Hepatic Neg liver ROS, GERD  Controlled,  Endo/Other  negative endocrine ROSHypothyroidism   Renal/GU negative Renal ROS  negative genitourinary   Musculoskeletal  (+) Arthritis ,   Abdominal   Peds  Hematology negative hematology ROS (+)   Anesthesia Other Findings Past Medical History: No date: Anxiety No date: Asthma No date: Bronchitis, chronic (HCC) No date: Carpal tunnel syndrome No date: COPD (chronic obstructive pulmonary disease) (HCC) No date: Depression No date: Disc degeneration, lumbosacral No date: GERD (gastroesophageal reflux disease) No date: Hypertension No date: Hypothyroidism No date: Migraine No date: Osteoporosis No date: Pre-diabetes No date: Sleep apnea     Comment:  uses cpap No date: Thyroid disease  Past Surgical History: No date: CARPAL TUNNEL RELEASE     Comment:  bilateral 10/20/2017: CARPAL TUNNEL RELEASE; Right     Comment:  Procedure: CARPAL TUNNEL RELEASE;  Surgeon: Leanor Kail, MD;  Location: [facility] ORS;  Service: Orthopedics;                Laterality: Right; No date: CESAREAN SECTION No date: EYE SURGERY     Comment:  simple surgery, not cataract 07/19/2020: FLAT FOOT CORRECTION; Left     Comment:  Procedure: Evans/MCDO-L;   Surgeon: [name], DPM;               Location: [facility] ORS;  Service: Podiatry;  Laterality:               Left; No date: FOOT SURGERY; Left     Comment:  screw in toe 07/19/2020: GASTROC RECESSION EXTREMITY; Left     Comment:  Procedure: GASTROC RECESSION EXTREMITY;  Surgeon:               [name], DPM;  Location: [facility] ORS;  Service:               Podiatry;  Laterality: Left; 10/2019: LAPAROSCOPIC GASTRIC SLEEVE RESECTION 07/19/2020: TENDON TRANSFER; Left     Comment:  Procedure: TENDON TRANSFER FDL transfer; Deep-L;                Surgeon: [name], DPM;  Location: [facility] ORS;                Service: Podiatry;  Laterality: Left;  BMI    Body Mass Index: 34.76 kg/m      Reproductive/Obstetrics negative OB ROS                             Anesthesia Physical Anesthesia Plan  ASA: 3  Anesthesia Plan: General   Post-op Pain Management:    Induction: Intravenous  PONV Risk Score and Plan: Propofol infusion and TIVA  Airway Management Planned: Natural Airway and  Nasal Cannula  Additional Equipment:   Intra-op Plan:   Post-operative Plan:   Informed Consent: I have reviewed the patients History and Physical, chart, labs and discussed the procedure including the risks, benefits and alternatives for the proposed anesthesia with the patient or authorized representative who has indicated his/her understanding and acceptance.     Dental Advisory Given  Plan Discussed with: Anesthesiologist, CRNA and Surgeon  Anesthesia Plan Comments: (Patient declines interpreter   Patient consented for risks of anesthesia including but not limited to:  - adverse reactions to medications - risk of airway placement if required - damage to eyes, teeth, lips or other oral mucosa - nerve damage due to positioning  - sore throat or hoarseness - Damage to heart, brain, nerves, lungs, other parts of body or loss of life  Patient voiced understanding.)         Anesthesia Quick Evaluation

## 2021-02-21 NOTE — Transfer of Care (Signed)
Immediate Anesthesia Transfer of Care Note  Patient: [name]  Procedure(s) Performed: COLONOSCOPY WITH PROPOFOL  Patient Location: Endoscopy Unit  Anesthesia Type:General  Level of Consciousness: drowsy  Airway & Oxygen Therapy: Patient Spontanous Breathing  Post-op Assessment: Report given to RN and Post -op Vital signs reviewed and stable  Post vital signs: Reviewed and stable  Last Vitals:  Vitals Value Taken Time  BP 97/58 02/21/21 1150  Temp 36.1 C 02/21/21 1150  Pulse 65 02/21/21 1150  Resp 23 02/21/21 1150  SpO2 98 % 02/21/21 1150    Last Pain:  Vitals:   02/21/21 1150  TempSrc: Temporal  PainSc: Asleep         Complications: No notable events documented.

## 2021-02-21 NOTE — Interval H&P Note (Signed)
History and Physical Interval Note: Preprocedure H&P from 02/21/21  was reviewed and there was no interval change after seeing and examining the patient.  Written consent was obtained from the patient after discussion of risks, benefits, and alternatives. Patient has consented to proceed with Colonoscopy with possible intervention   02/21/2021 11:13 AM  [name]  has presented today for surgery, with the diagnosis of family history colon polyps mother.  The various methods of treatment have been discussed with the patient and family. After consideration of risks, benefits and other options for treatment, the patient has consented to  Procedure(s) with comments: COLONOSCOPY WITH PROPOFOL (N/A) - SPANISH INTERPRETER as a surgical intervention.  The patient's history has been reviewed, patient examined, no change in status, stable for surgery.  I have reviewed the patient's chart and labs.  Questions were answered to the patient's satisfaction.     Annamaria Helling

## 2021-02-21 NOTE — Anesthesia Postprocedure Evaluation (Signed)
Anesthesia Post Note  Patient: [name]  Procedure(s) Performed: COLONOSCOPY WITH PROPOFOL  Patient location during evaluation: Endoscopy Anesthesia Type: General Level of consciousness: awake and alert Pain management: pain level controlled Vital Signs Assessment: post-procedure vital signs reviewed and stable Respiratory status: spontaneous breathing, nonlabored ventilation, respiratory function stable and patient connected to nasal cannula oxygen Cardiovascular status: blood pressure returned to baseline and stable Postop Assessment: no apparent nausea or vomiting Anesthetic complications: no   No notable events documented.   Last Vitals:  Vitals:   02/21/21 1200 02/21/21 1220  BP: (!) 98/59 102/78  Pulse: 74 65  Resp: 20 18  Temp:    SpO2: 100% 100%    Last Pain:  Vitals:   02/21/21 1220  TempSrc:   PainSc: 0-No pain                 Precious Haws Marquin Patino

## 2021-02-21 NOTE — Progress Notes (Signed)
Spanish speaking interpreter was offered upon greeting patient in the waiting area. She declined.

## 2021-02-21 NOTE — Op Note (Signed)
[facility] Gastroenterology Patient Name: [name] Procedure Date: 02/21/2021 10:54 AM MRN: [id number] Account #: [id number] Date of Birth: 1970-04-07 Admit Type: Outpatient Age: 51 Room: [facility] ENDO ROOM 1 Gender: Female Note Status: Finalized Instrument Name: Jasper Riling [id number] Procedure:             Colonoscopy Indications:           Colon cancer screening in patient at increased risk:                         Family history of 1st-degree relative with colon polyps Providers:             Rueben Bash, DO Referring MD:          Wynona Canes. [name] Groom, MD (Referring MD) Medicines:             Monitored Anesthesia Care Complications:         No immediate complications. Estimated blood loss:                         Minimal. Procedure:             Pre-Anesthesia Assessment:                        - Prior to the procedure, a History and Physical was                         performed, and patient medications and allergies were                         reviewed. The patient is competent. The risks and                         benefits of the procedure and the sedation options and                         risks were discussed with the patient. All questions                         were answered and informed consent was obtained.                         Patient identification and proposed procedure were                         verified by the physician, the nurse, the anesthetist                         and the technician in the endoscopy suite. Mental                         Status Examination: alert and oriented. Airway                         Examination: normal oropharyngeal airway and neck                         mobility. Respiratory Examination: clear to  auscultation. CV Examination: RRR, no murmurs, no S3                         or S4. Prophylactic Antibiotics: The patient does not                         require prophylactic  antibiotics. Prior                         Anticoagulants: The patient has taken no previous                         anticoagulant or antiplatelet agents. ASA Grade                         Assessment: II - A patient with mild systemic disease.                         After reviewing the risks and benefits, the patient                         was deemed in satisfactory condition to undergo the                         procedure. The anesthesia plan was to use monitored                         anesthesia care (MAC). Immediately prior to                         administration of medications, the patient was                         re-assessed for adequacy to receive sedatives. The                         heart rate, respiratory rate, oxygen saturations,                         blood pressure, adequacy of pulmonary ventilation, and                         response to care were monitored throughout the                         procedure. The physical status of the patient was                         re-assessed after the procedure.                        After obtaining informed consent, the colonoscope was                         passed under direct vision. Throughout the procedure,                         the patient's blood pressure, pulse, and oxygen  saturations were monitored continuously. The                         Colonoscope was introduced through the anus and                         advanced to the the cecum, identified by appendiceal                         orifice and ileocecal valve. The colonoscopy was                         performed without difficulty. The patient tolerated                         the procedure well. The quality of the bowel                         preparation was evaluated using the [facility] Bowel                         Preparation Scale) with scores of: Right Colon = 3,                         Transverse Colon = 3 and Left Colon = 3  (entire mucosa                         seen well with no residual staining, small fragments                         of stool or opaque liquid). The total BBPS score                         equals 9. The ileocecal valve, appendiceal orifice,                         and rectum were photographed. The ileocecal valve,                         appendiceal orifice, and rectum were photographed. Findings:      The perianal and digital rectal examinations were normal. Pertinent       negatives include normal sphincter tone.      Four sessile polyps were found in the rectum (2) and transverse (2)       colon. The polyps were 1 to 2 mm in size. These polyps were removed with       a cold biopsy forceps. Resection and retrieval were complete. Estimated       blood loss was minimal.      The exam was otherwise without abnormality on direct and retroflexion       views. Impression:            - Four 1 to 2 mm polyps in the rectum and in the                         transverse colon, removed with a cold biopsy forceps.  Resected and retrieved.                        - The examination was otherwise normal on direct and                         retroflexion views. Recommendation:        - Discharge patient to home.                        - Resume previous diet.                        - Continue present medications.                        - Await pathology results.                        - Repeat colonoscopy for surveillance based on                         pathology results.                        - Return to referring physician as previously                         scheduled. Procedure Code(s):     --- Professional ---                        [phone], Colonoscopy, flexible; with biopsy, single or                         multiple Diagnosis Code(s):     --- Professional ---                        Z83.71, Family history of colonic polyps                        K62.1, Rectal polyp                         K63.5, Polyp of colon CPT copyright 2019 [employer]. All rights reserved. The codes documented in this report are preliminary and upon coder review may  be revised to meet current compliance requirements. Attending Participation:      I personally performed the entire procedure. Volney American, DO Annamaria Helling DO, DO 02/21/2021 11:55:12 AM This report has been signed electronically. Number of Addenda: 0 Note Initiated On: 02/21/2021 10:54 AM Scope Withdrawal Time: 0 hours 14 minutes 32 seconds  Total Procedure Duration: 0 hours 27 minutes 32 seconds  Estimated Blood Loss:  Estimated blood loss was minimal. Estimated blood loss                         was minimal.      [facility]

## 2021-02-24 ENCOUNTER — Encounter: Payer: Self-pay | Admitting: Gastroenterology

## 2021-02-24 LAB — SURGICAL PATHOLOGY

## 2021-04-11 ENCOUNTER — Other Ambulatory Visit: Payer: Self-pay | Admitting: Physical Medicine & Rehabilitation

## 2021-04-11 DIAGNOSIS — M5442 Lumbago with sciatica, left side: Secondary | ICD-10-CM

## 2021-04-11 DIAGNOSIS — G8929 Other chronic pain: Secondary | ICD-10-CM

## 2021-04-19 ENCOUNTER — Ambulatory Visit: Admission: RE | Admit: 2021-04-19 | Payer: Medicare Other | Source: Ambulatory Visit

## 2021-04-30 ENCOUNTER — Ambulatory Visit
Admission: RE | Admit: 2021-04-30 | Discharge: 2021-04-30 | Disposition: A | Discharge status: Home / Self Care | Payer: Medicare Other | Source: Ambulatory Visit | Attending: Physical Medicine & Rehabilitation | Admitting: Physical Medicine & Rehabilitation

## 2021-04-30 DIAGNOSIS — M5442 Lumbago with sciatica, left side: Secondary | ICD-10-CM | POA: Insufficient documentation

## 2021-04-30 DIAGNOSIS — G8929 Other chronic pain: Secondary | ICD-10-CM | POA: Diagnosis present

## 2021-04-30 IMAGING — MR MR LUMBAR SPINE W/O CM
5 series · 31 of 48 positions shown · non-contrast
Comparison: None available.

CLINICAL DATA: Initial evaluation for chronic low back pain.

EXAM:
MRI LUMBAR SPINE WITHOUT CONTRAST
TECHNIQUE: Multiplanar, multisequence MR imaging of the lumbar spine was
performed. No intravenous contrast was administered.

[Series 5: T2 · sagittal · 4.0mm · 0.81mm/px · 6 of 15 slices shown (1 of 2)]
[im 1/15]
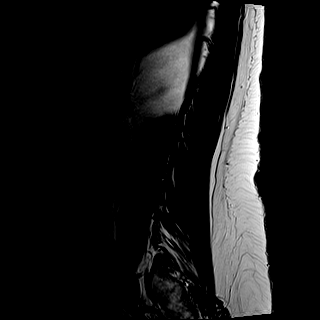
[im 3/15]
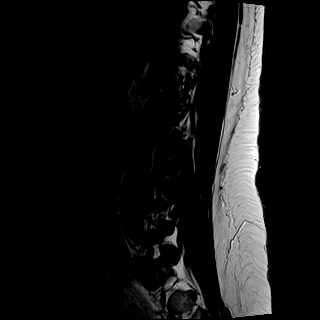
[im 6/15]
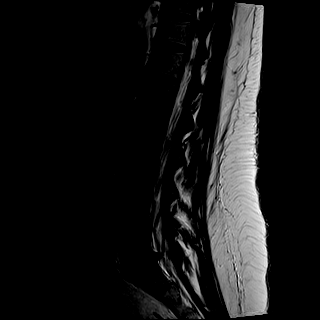
[im 9/15]
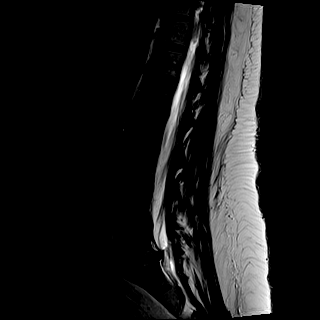
[im 12/15]
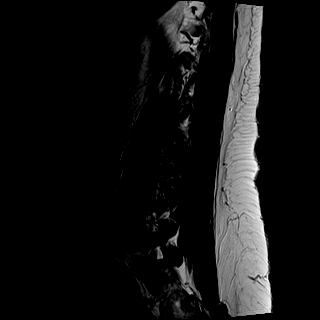
[im 15/15]
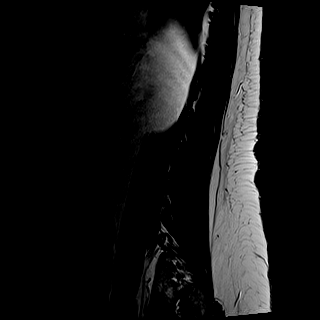

[Series 6: T1 · sagittal · 4.0mm · 0.81mm/px · 7 of 15 slices shown (1 of 2)]
[im 1/15]
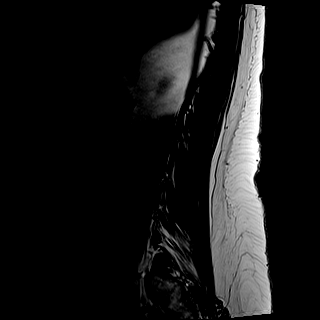
[im 3/15]
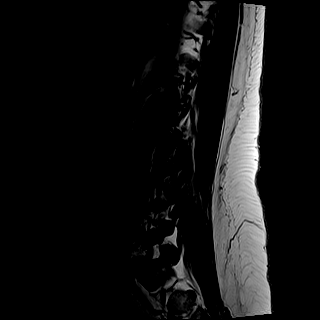
[im 5/15]
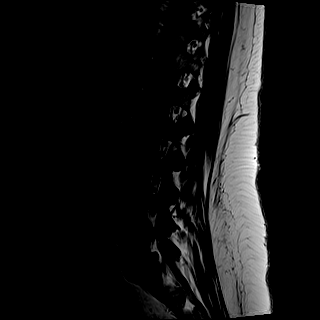
[im 8/15]
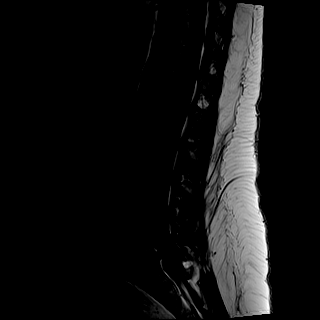
[im 10/15]
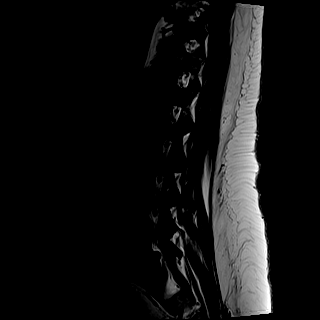
[im 12/15]
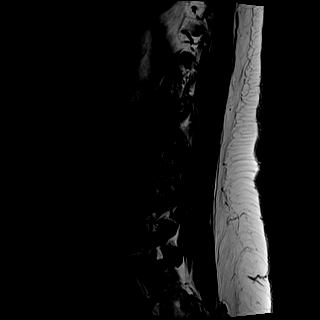
[im 15/15]
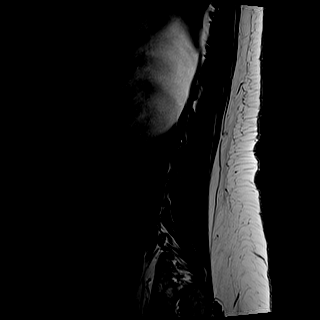

[Series 7: STIR · sagittal · 4.0mm · 0.41mm/px · 2 of 15 slices shown]
[im 1/15]
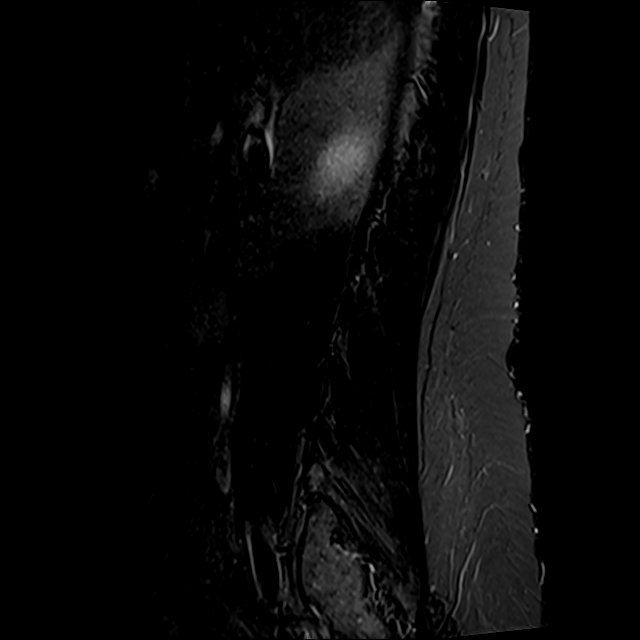
[im 3/15]
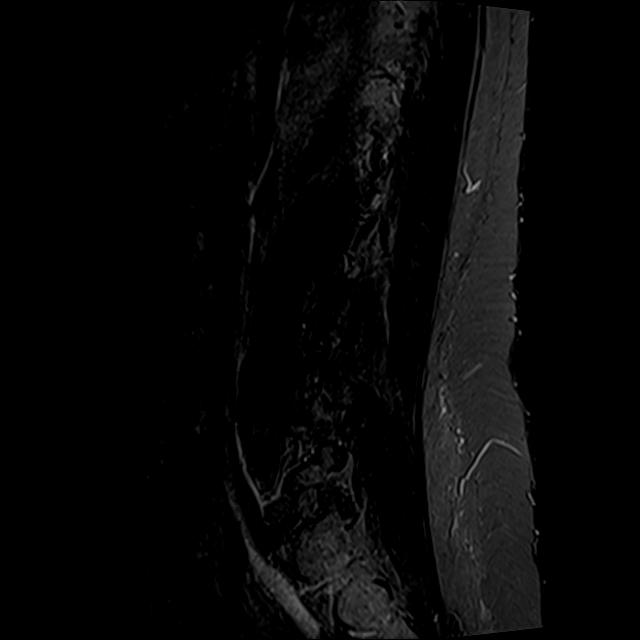

[Series 8: T2 · axial · 4.0mm · 0.78mm/px · z∈[-160,+34]mm · 8 of 32 slices shown (2 of 2)]
[im 1/32]
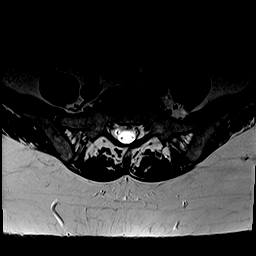
[im 5/32]
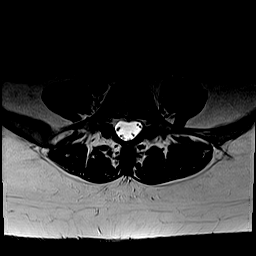
[im 10/32]
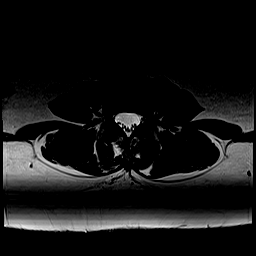
[im 15/32]
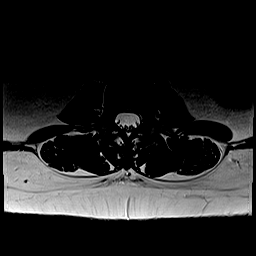
[im 17/32]
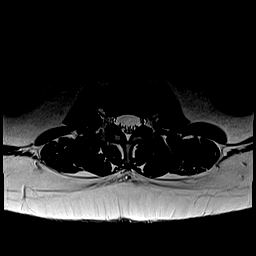
[im 22/32]
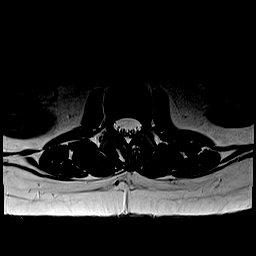
[im 27/32]
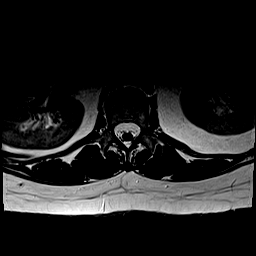
[im 32/32]
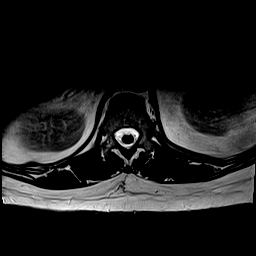

[Series 9: T1 · axial · 4.0mm · 0.39mm/px · z∈[-160,+34]mm · 8 of 32 slices shown (2 of 2)]
[im 1/32]
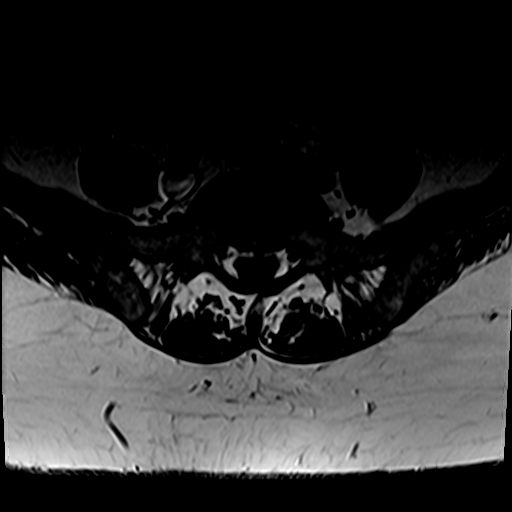
[im 5/32]
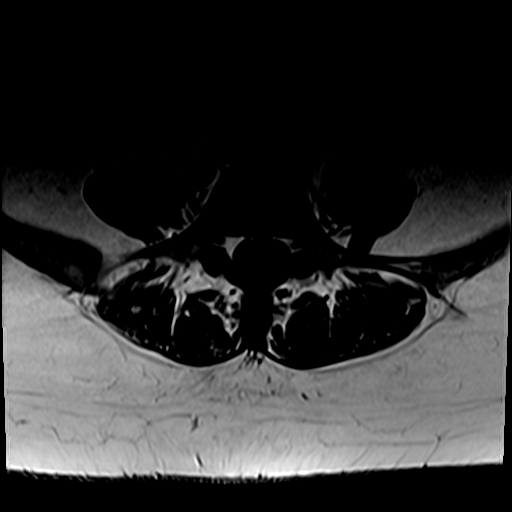
[im 10/32]
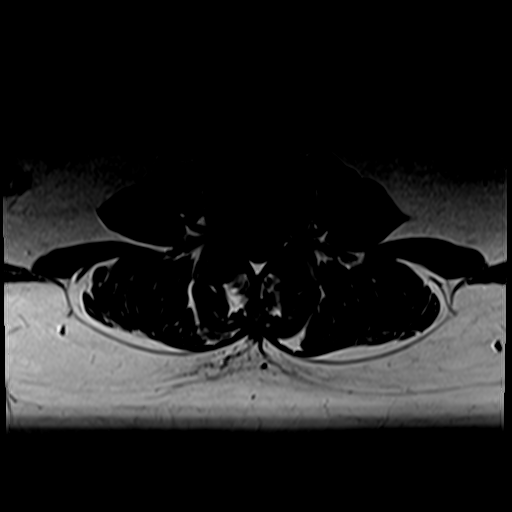
[im 15/32]
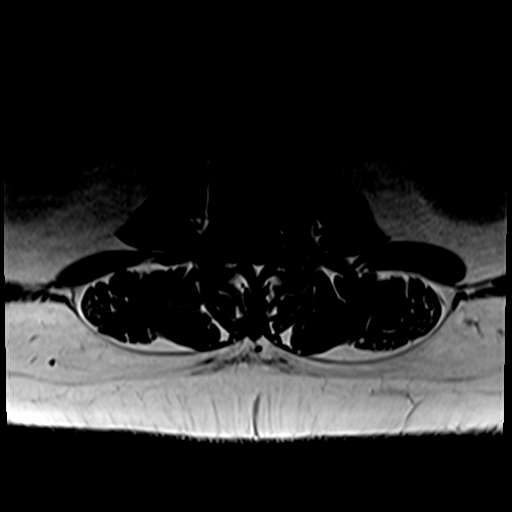
[im 17/32]
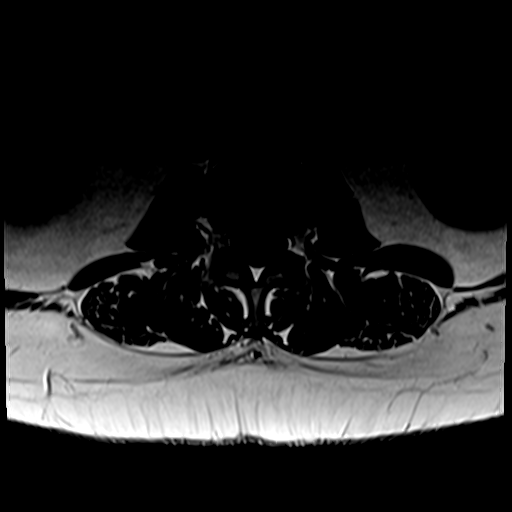
[im 22/32]
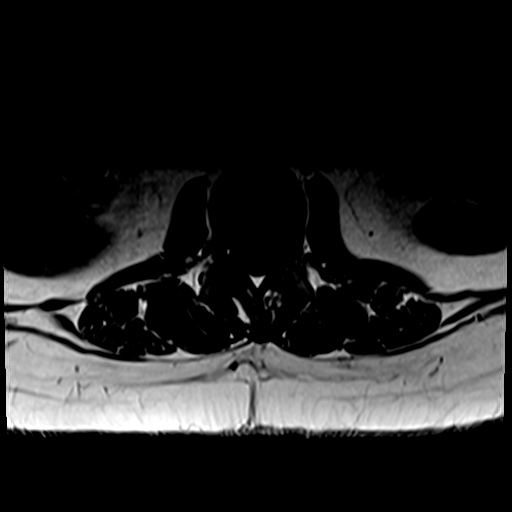
[im 27/32]
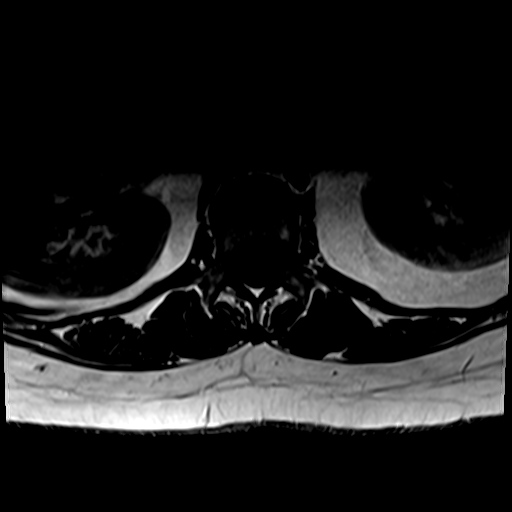
[im 32/32]
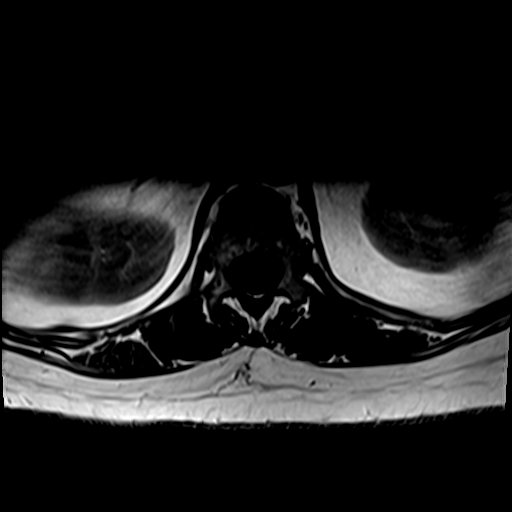

[31 of 48 positions shown; findings below may reference images not displayed]

FINDINGS: Segmentation: Standard. Lowest well-formed disc space labeled the
L5-S1 level.

Alignment: Physiologic with preservation of the normal lumbar
lordosis. No listhesis.

Vertebrae: Vertebral body height maintained without acute or chronic
fracture. Bone marrow signal intensity within normal limits. Few
scattered subcentimeter benign hemangiomata noted. No worrisome
osseous lesions. No abnormal marrow edema.

Conus medullaris and cauda equina: Conus extends to the L1-2 level.
Conus and cauda equina appear normal.

Paraspinal and other soft tissues: Paraspinous soft tissues within
normal limits. 1.5 cm simple cyst noted within the right kidney,
benign in appearance, with no follow-up imaging recommended.
Visualized paraspinous soft tissues otherwise unremarkable.

Disc levels:

Lumbar spine is image from the T10-11 level inferiorly. No
significant findings are seen through the L4-5 level.

L5-S1: Moderate degenerative intervertebral disc space narrowing
with diffuse disc bulge and disc desiccation. Associated discogenic
reactive endplate change with marginal endplate osteophytic
spurring. Mild bilateral facet hypertrophy. No significant spinal
stenosis. Mild left L5 foraminal narrowing. Right neural foramen
remains patent. No frank neural impingement.
IMPRESSION: 1. Isolated moderate degenerative spondylosis at L5-S1 with
resultant mild left L5 foraminal stenosis. No significant spinal
stenosis or neural impingement.
2. Otherwise normal MRI of the lumbar spine.

## 2021-10-23 ENCOUNTER — Ambulatory Visit: Payer: Medicare Other | Admitting: Student in an Organized Health Care Education/Training Program

## 2021-11-05 ENCOUNTER — Other Ambulatory Visit: Payer: Self-pay | Admitting: Gastroenterology

## 2021-11-05 DIAGNOSIS — K449 Diaphragmatic hernia without obstruction or gangrene: Secondary | ICD-10-CM

## 2021-11-05 DIAGNOSIS — R1013 Epigastric pain: Secondary | ICD-10-CM

## 2021-11-05 DIAGNOSIS — R1010 Upper abdominal pain, unspecified: Secondary | ICD-10-CM

## 2021-11-06 ENCOUNTER — Other Ambulatory Visit: Payer: Self-pay | Admitting: Gastroenterology

## 2021-11-06 DIAGNOSIS — K219 Gastro-esophageal reflux disease without esophagitis: Secondary | ICD-10-CM

## 2021-11-06 DIAGNOSIS — R1013 Epigastric pain: Secondary | ICD-10-CM

## 2021-11-06 DIAGNOSIS — R1010 Upper abdominal pain, unspecified: Secondary | ICD-10-CM

## 2021-11-06 DIAGNOSIS — K449 Diaphragmatic hernia without obstruction or gangrene: Secondary | ICD-10-CM

## 2021-11-25 ENCOUNTER — Ambulatory Visit
Payer: Medicare Other | Attending: Student in an Organized Health Care Education/Training Program | Admitting: Student in an Organized Health Care Education/Training Program

## 2021-11-25 ENCOUNTER — Encounter: Payer: Self-pay | Admitting: Student in an Organized Health Care Education/Training Program

## 2021-11-25 VITALS — BP 101/74 | HR 80 | Temp 97.3°F | Resp 17 | Ht 61.0 in | Wt 180.0 lb

## 2021-11-25 DIAGNOSIS — G894 Chronic pain syndrome: Secondary | ICD-10-CM | POA: Insufficient documentation

## 2021-11-25 DIAGNOSIS — G8929 Other chronic pain: Secondary | ICD-10-CM | POA: Insufficient documentation

## 2021-11-25 DIAGNOSIS — M5412 Radiculopathy, cervical region: Secondary | ICD-10-CM | POA: Diagnosis present

## 2021-11-25 DIAGNOSIS — M5416 Radiculopathy, lumbar region: Secondary | ICD-10-CM | POA: Insufficient documentation

## 2021-11-25 MED ORDER — PREGABALIN 25 MG PO CAPS: 50.0000 mg | ORAL_CAPSULE | Freq: Two times a day (BID) | ORAL | 1 refills | Status: AC

## 2021-11-25 NOTE — Patient Instructions (Signed)
Do not eat or drink for 8 hours prior to procedure  You may drive yourself home Take blood pressure med with a tiny sip of water the morning of procedure  You can take your Diabetic medicaine after the procedure when you are able to eat.

## 2021-11-25 NOTE — Progress Notes (Signed)
Patient: [name]  Service Category: E/M  Provider: Gillis Santa, MD  DOB: 1970-08-04  DOS: 11/25/2021  Referring Provider: Doyle Askew, MD  MRN: [id number]  Setting: Ambulatory outpatient  PCP: Shannon Castle, MD  Type: New Patient  Specialty: Interventional Pain Management    Location: Office  Delivery: Face-to-face     Primary Reason(s) for Visit: Encounter for initial evaluation of one or more chronic problems (new to examiner) potentially causing chronic pain, and posing a threat to normal musculoskeletal function. (Level of risk: High) CC: Neck Pain, Back Pain, and Foot Pain (Left)  HPI  [name] is a 51 y.o. year old, female patient, who comes for the first time to our practice referred by Shannon Hake I, MD for our initial evaluation of her chronic pain. She has Chronic radicular lumbar pain; Cervical radicular pain; and Chronic pain syndrome on their problem list. Today she comes in for evaluation of her Neck Pain, Back Pain, and Foot Pain (Left)  Pain Assessment: Location:   Neck Radiating: to shoulders bilat Onset: More than a month ago Duration: Chronic pain Quality: Heaviness, Other (Comment) ("feels like someone is pushing my neck from behind") Severity: 8 /10 (subjective, self-reported pain score)  Effect on ADL: limits daily activities Timing: Constant Modifying factors: steroid injections, lidocaine patches BP: 101/74  HR: 80  Onset and Duration: Gradual and Present longer than 3 months Cause of pain: Unknown Severity: No change since onset, NAS-11 at its worse: 8/10, NAS-11 now: 6/10, and NAS-11 on the average: 9/10 Timing: Not influenced by the time of the day Aggravating Factors: Bending, Climbing, Kneeling, Lifiting, Motion, Prolonged sitting, Prolonged standing, Squatting, Twisting, Walking, Walking uphill, Walking downhill, and Working Alleviating Factors: Acupuncture, Hot packs, Medications, and Relaxation therapy Associated  Problems: Depression, Spasms, Weakness, and Pain that wakes patient up Quality of Pain: Aching, Annoying, Burning, Constant, Cramping, Deep, Disabling, Dull, Heavy, Pressure-like, Sharp, and Uncomfortable Previous Examinations or Tests: Bone scan, CT scan, EMG/PNCV, Endoscopy, MRI scan, X-rays, Nerve conduction test, Neurosurgical evaluation, Orthopedic evaluation, and Psychiatric evaluation Previous Treatments: Epidural steroid injections, Facet blocks, Narcotic medications, Physical Therapy, Steroid treatments by mouth, Stretching exercises, and TENS  Patient presents with a chief complaint of cervical spine pain with radiation into bilateral shoulders.  She also has low back pain with radiation into her left foot.  In regards to her primary pain generator which is her cervical spine pain, she has completed physical therapy in the past.  MRI of cervical spine shows mild to moderate right neuroforaminal stenosis at C7-T1 with small right central disc bulge at C6-C7.  Upper extremity EMG studies were unremarkable.  She has had a left C6-C7 transforaminal ESI on 12/30/2020 with 50% pain relief and a right C6-C7 transforaminal ESI on 07/07/2021 without any pain relief.  She is being referred here by [name] to consider an interlaminar cervical epidural steroid injection.  She is also being referred here to consider spinal cord stimulation.  In regards to her low back pain with radiation into left leg, this is consistent with left L4-L5, L5-S1 radiculopathy.  She has had left S1 transforaminal ESI as well as left L5-S1 transforaminal ESI with benefit.  She has tried gabapentin and Flexeril in the past with limited response.  She is on Lyrica 25 mg twice daily which she states is helpful.  She has not tried a higher dose.  Does not endorse any side effects with this medication.   [name] was informed that [name]  continue to offer evaluations and recommendations for medication management but [name] no longer  take patients to write for their medications. [name] informed her that this visit is an evaluation only and that on the follow up appointment [name] will go over the my review of the case, the results of available tests, and assuming that there are no contraindications, we will provide her with information about possible interventional pain management options. At that time she will have the opportunity to decide whether or not to proceed with those therapies. In the event that [name] decides not to go with those options, or prefers to stay away from interventional therapies, this will conclude our involvement in the case.   Meds   Current Outpatient Medications:    albuterol (PROVENTIL HFA;VENTOLIN HFA) 108 (90 Base) MCG/ACT inhaler, Inhale 2 puffs into the lungs every 8 (eight) hours as needed for wheezing or shortness of breath. Last used today, [id number], AM., Disp: , Rfl:    albuterol (PROVENTIL) (2.5 MG/3ML) 0.083% nebulizer solution, Inhale 2.5 mg into the lungs every 6 (six) hours as needed for wheezing or shortness of breath. Last used today, [id number], AM., Disp: , Rfl:    benzonatate (TESSALON) 100 MG capsule, Take 1 capsule (100 mg total) by mouth every 8 (eight) hours., Disp: 21 capsule, Rfl: 0   budesonide-formoterol (SYMBICORT) 160-4.5 MCG/ACT inhaler, Inhale 2 puffs into the lungs 2 (two) times daily., Disp: , Rfl:    calcium carbonate (OSCAL) 1500 (600 Ca) MG TABS tablet, Take 600 mg of elemental calcium by mouth daily with breakfast., Disp: , Rfl:    carvedilol (COREG) 25 MG tablet, Take 25 mg by mouth 2 (two) times daily with a meal., Disp: , Rfl:    cholecalciferol (VITAMIN D3) 25 MCG (1000 UNIT) tablet, Take 1,000 Units by mouth daily., Disp: , Rfl:    clonazePAM (KLONOPIN) 1 MG tablet, Take 1 mg by mouth at bedtime., Disp: , Rfl: 1   Cyanocobalamin (VITAMIN B-12 PO), Take 1 tablet by mouth daily., Disp: , Rfl:    cyclobenzaprine (FLEXERIL) 10 MG tablet, Take 10 mg by mouth 3 (three)  times daily as needed for muscle spasms., Disp: , Rfl:    famotidine (PEPCID) 40 MG tablet, Take 40 mg by mouth at bedtime., Disp: , Rfl:    FLUoxetine (PROZAC) 10 MG tablet, Take 10 mg by mouth daily., Disp: , Rfl:    fluticasone (FLONASE) 50 MCG/ACT nasal spray, Place 2 sprays into both nostrils daily., Disp: , Rfl:    guaiFENesin (MUCINEX) 600 MG 12 hr tablet, Take 1 tablet (600 mg total) by mouth 2 (two) times daily., Disp: 30 tablet, Rfl: 0   hydrochlorothiazide (HYDRODIURIL) 25 MG tablet, Take 25 mg by mouth in the morning., Disp: , Rfl:    hyoscyamine (LEVSIN SL) 0.125 MG SL tablet, Place 0.125 mg under the tongue every 4 (four) hours as needed., Disp: , Rfl:    levalbuterol (XOPENEX HFA) 45 MCG/ACT inhaler, Inhale 2 puffs into the lungs every 4 (four) hours as needed for wheezing., Disp: , Rfl:    levothyroxine (SYNTHROID) 112 MCG tablet, Take 112 mcg by mouth daily before breakfast., Disp: , Rfl:    loratadine (CLARITIN) 10 MG tablet, Take 10 mg by mouth in the morning., Disp: , Rfl:    medroxyPROGESTERone (PROVERA) 10 MG tablet, Take 10 mg by mouth daily., Disp: , Rfl:    metFORMIN (GLUCOPHAGE-XR) 750 MG 24 hr tablet, Take 750 mg by mouth 3 (three) times a week.,  Disp: , Rfl:    MM MELATONIN PO, Take by mouth., Disp: , Rfl:    montelukast (SINGULAIR) 10 MG tablet, Take 10 mg by mouth at bedtime., Disp: , Rfl:    Multiple Vitamins-Minerals (BARIATRIC MULTIVITAMINS/IRON PO), Take 1 tablet by mouth daily., Disp: , Rfl:    nitroGLYCERIN (NITROSTAT) 0.4 MG SL tablet, Place 0.4 mg under the tongue every 5 (five) minutes x 3 doses as needed for chest pain., Disp: , Rfl:    nystatin ointment (MYCOSTATIN), Apply 1 application topically 2 (two) times daily., Disp: , Rfl:    oxyCODONE-acetaminophen (PERCOCET) 5-325 MG tablet, Take 1-2 tablets by mouth every 6 (six) hours as needed for severe pain. Max 6 tabs per day, Disp: 30 tablet, Rfl: 0   oxyCODONE-acetaminophen (PERCOCET) 5-325 MG tablet, Take  1-2 tablets by mouth every 6 (six) hours as needed for severe pain. Max 6 tabs per day, Disp: 30 tablet, Rfl: 0   Potassium Gluconate 550 MG TABS, Take 550 mg by mouth daily with lunch., Disp: , Rfl:    promethazine-dextromethorphan (PROMETHAZINE-DM) 6.25-15 MG/5ML syrup, Take 5 mLs by mouth 4 (four) times daily as needed for cough., Disp: 118 mL, Rfl: 0   Tiotropium Bromide Monohydrate (SPIRIVA RESPIMAT) 1.25 MCG/ACT AERS, Inhale into the lungs., Disp: , Rfl:    acetaminophen (TYLENOL) 160 MG/5ML liquid, Take 976 mg by mouth every 4 (four) hours as needed for fever. (Patient not taking: Reported on 11/25/2021), Disp: , Rfl:    pregabalin (LYRICA) 25 MG capsule, Take 2 capsules (50 mg total) by mouth 2 (two) times daily., Disp: 120 capsule, Rfl: 1  Imaging Review  Cervical Imaging: Cervical MR wo contrast: Results for orders placed during the hospital encounter of 08/01/19  MR CERVICAL SPINE WO CONTRAST  Narrative CLINICAL DATA:  Cervical radiculopathy. Intervertebral discs stenosis of neural canal or cervical region. Chronic neck pain. Additional history provided by technologist: Patient reports right neck and arm pain, history of carpal tunnel surgery on the right.  EXAM: MRI CERVICAL SPINE WITHOUT CONTRAST  TECHNIQUE: Multiplanar, multisequence MR imaging of the cervical spine was performed. No intravenous contrast was administered.  COMPARISON:  Cervical spine MRI 07/07/2018  FINDINGS: Mild intermittent motion degradation.  Alignment: Straightening of the expected cervical lordosis. No significant spondylolisthesis.  Vertebrae: There is no suspicious osseous lesion or significant marrow edema.  Cord: No spinal cord signal abnormality is identified.  Posterior Fossa, vertebral arteries, paraspinal tissues: No abnormality is identified within included portions of the posterior fossa. Flow voids preserved within the imaged cervical vertebral arteries. Paraspinal soft  tissues within normal limits.  Disc levels:  Unless otherwise stated, the level by level findings below have not significantly changed since prior MRI 07/07/2018.  Unchanged mild multilevel disc degeneration.  C2-C3: Minimal facet hypertrophy.  No stenosis.  C3-C4: Minimal facet hypertrophy.  No stenosis.  C4-C5: Minimal facet hypertrophy.  No stenosis.  C5-C6: Minimal facet hypertrophy.  No stenosis.  C6-C7: Disc bulge with superimposed small right center disc protrusion. Mild endplate spurring. Mild relative spinal canal narrowing without spinal cord mass effect. No significant foraminal stenosis.  C7-T1: Mild endplate spurring and facet hypertrophy. No significant spinal canal stenosis. Mild/mild right neural foraminal narrowing.  IMPRESSION: Cervical spondylosis as described and unchanged as compared to prior MRI 07/07/2018. Findings are most notably as follows.  At C6-C7, there is a disc bulge with superimposed small right center disc protrusion. Mild endplate spurring. Mild relative spinal canal narrowing without spinal cord mass effect. No significant  foraminal stenosis.  At C7-T1, mild endplate spurring and facet hypertrophy. Mild/moderate right neural foraminal narrowing. No significant canal stenosis.  Minimal facet hypertrophy at the remaining levels without stenosis.   Electronically Signed By: Kellie Simmering DO On: 08/02/2019 07:53   Narrative CLINICAL DATA:  Head trauma, migraine, dizziness, fell in bathtub last week striking her head, hurting since, neck pain  EXAM: CT HEAD WITHOUT CONTRAST  CT CERVICAL SPINE WITHOUT CONTRAST  TECHNIQUE: Multidetector CT imaging of the head and cervical spine was performed following the standard protocol without intravenous contrast. Multiplanar CT image reconstructions of the cervical spine were also generated.  COMPARISON:  MRI cervical spine 08/01/2019  FINDINGS: CT HEAD FINDINGS  Brain: Mild atrophy.  Normal ventricular morphology. No midline shift or mass effect. Otherwise normal appearance of brain parenchyma. No intracranial hemorrhage, mass lesion or evidence of acute infarction. No extra-axial fluid collections.  Vascular: No hyperdense vessels  Skull: Intact  Sinuses/Orbits: Clear  Other: N/A  CT CERVICAL SPINE FINDINGS  Alignment: Normal  Skull base and vertebrae: Osseous mineralization normal. Skull base intact. Vertebral body and disc space heights maintained. No fracture, subluxation, or bone destruction.  Soft tissues and spinal canal: Prevertebral soft tissues normal thickness.  Disc levels:  No specific abnormalities  Upper chest: Lung apices clear  Other: N/A  IMPRESSION: Mild generalized atrophy.  No acute intracranial abnormalities.  Normal CT cervical spine.   Electronically Signed By: Lavonia Dana M.D. On: 09/24/2020 17:35 Lumbosacral Imaging: Lumbar MR wo contrast: Results for orders placed during the hospital encounter of 04/30/21  MR LUMBAR SPINE WO CONTRAST  Narrative CLINICAL DATA:  Initial evaluation for chronic low back pain.  EXAM: MRI LUMBAR SPINE WITHOUT CONTRAST  TECHNIQUE: Multiplanar, multisequence MR imaging of the lumbar spine was performed. No intravenous contrast was administered.  COMPARISON:  None available.  FINDINGS: Segmentation: Standard. Lowest well-formed disc space labeled the L5-S1 level.  Alignment: Physiologic with preservation of the normal lumbar lordosis. No listhesis.  Vertebrae: Vertebral body height maintained without acute or chronic fracture. Bone marrow signal intensity within normal limits. Few scattered subcentimeter benign hemangiomata noted. No worrisome osseous lesions. No abnormal marrow edema.  Conus medullaris and cauda equina: Conus extends to the L1-2 level. Conus and cauda equina appear normal.  Paraspinal and other soft tissues: Paraspinous soft tissues within normal limits.  1.5 cm simple cyst noted within the right kidney, benign in appearance, with no follow-up imaging recommended. Visualized paraspinous soft tissues otherwise unremarkable.  Disc levels:  Lumbar spine is image from the T10-11 level inferiorly. No significant findings are seen through the L4-5 level.  L5-S1: Moderate degenerative intervertebral disc space narrowing with diffuse disc bulge and disc desiccation. Associated discogenic reactive endplate change with marginal endplate osteophytic spurring. Mild bilateral facet hypertrophy. No significant spinal stenosis. Mild left L5 foraminal narrowing. Right neural foramen remains patent. No frank neural impingement.  IMPRESSION: 1. Isolated moderate degenerative spondylosis at L5-S1 with resultant mild left L5 foraminal stenosis. No significant spinal stenosis or neural impingement. 2. Otherwise normal MRI of the lumbar spine.   Electronically Signed By: Jeannine Boga M.D. On: 04/30/2021 21:12 DG Foot Complete Left  Narrative CLINICAL DATA:  Pain in the region of the left 1st metatarsal and left ankle the past 4 days. No known injury.  EXAM: LEFT FOOT - COMPLETE 3+ VIEW  COMPARISON:  None.  FINDINGS: Postsurgical changes involving the distal 5th metatarsal with 2 screws in place. Mild 1st MTP joint spur formation. Os naviculare.  Moderate sized inferior calcaneal enthesophyte.  IMPRESSION: No acute abnormality. Mild 1st MTP joint degenerative changes.   Electronically Signed By: Claudie Revering M.D. On: 10/07/2019 15:16   DG Hand Complete Right  Narrative CLINICAL DATA:  History of prior surgery.  Pain.  EXAM: RIGHT HAND - COMPLETE 3+ VIEW  COMPARISON:  No prior.  FINDINGS: Diffuse degenerative change noted. Small carpal cysts are noted. These most likely degenerative. Crystal induced arthropathy including CPPD cannot be excluded. No acute abnormality. No evidence of fracture or  dislocation.  IMPRESSION: Diffuse degenerative change.  No acute abnormality.   Electronically Signed By: Marcello Moores  Register On: 12/28/2017 06:41   Narrative CLINICAL DATA:  History of carpal tunnel surgery.  Pain.  EXAM: LEFT HAND - COMPLETE 3+ VIEW  COMPARISON:  No prior.  FINDINGS: Diffuse mild degenerative change. Small carpal cysts are noted. These most likely degenerative. Crystal induced arthropathy such as CPPD cannot be excluded. No acute abnormality no evidence of fracture or dislocation.  IMPRESSION: Diffuse mild degenerative change.  No acute abnormality   Electronically Signed By: Marcello Moores  Register On: 12/28/2017 07:09   Complexity Note: Imaging results reviewed.                         ROS  Cardiovascular: High blood pressure, Chest pain, and Needs antibiotics prior to dental procedures Pulmonary or Respiratory: Wheezing and difficulty taking a deep full breath (Asthma), Shortness of breath, Snoring , and Temporary stoppage of breathing during sleep Neurological: No reported neurological signs or symptoms such as seizures, abnormal skin sensations, urinary and/or fecal incontinence, being born with an abnormal open spine and/or a tethered spinal cord Psychological-Psychiatric: Anxiousness and Depressed Gastrointestinal: Heartburn due to stomach pushing into lungs (Hiatal hernia) and Reflux or heatburn. Genitourinary: No reported renal or genitourinary signs or symptoms such as difficulty voiding or producing urine, peeing blood, non-functioning kidney, kidney stones, difficulty emptying the bladder, difficulty controlling the flow of urine, or chronic kidney disease Hematological: No reported hematological signs or symptoms such as prolonged bleeding, low or poor functioning platelets, bruising or bleeding easily, hereditary bleeding problems, low energy levels due to low hemoglobin or being anemic Endocrine: High blood sugar controlled without the use of  insulin (NIDDM) and Slow thyroid Rheumatologic: Joint aches and or swelling due to excess weight (Osteoarthritis) Musculoskeletal: Negative for myasthenia gravis, muscular dystrophy, multiple sclerosis or malignant hyperthermia Work History: Disabled  Allergies  [name] is allergic to morphine and related, percocet [oxycodone-acetaminophen], and toradol [ketorolac tromethamine].  Laboratory Chemistry Profile   Renal Lab Results  Component Value Date   BUN 15 02/10/2021   CREATININE 0.66 02/10/2021   GFRNONAA >60 02/10/2021   PROTEINUR NEGATIVE 02/10/2021     Electrolytes Lab Results  Component Value Date   NA 134 (L) 02/10/2021   K 3.5 02/10/2021   CL 97 (L) 02/10/2021   CALCIUM 9.4 02/10/2021     Hepatic Lab Results  Component Value Date   AST 13 (L) 02/10/2021   ALT 15 02/10/2021   ALBUMIN 3.9 02/10/2021   ALKPHOS 74 02/10/2021     ID Lab Results  Component Value Date   SARSCOV2NAA NEGATIVE 02/10/2021   PREGTESTUR NEGATIVE 02/21/2021     Bone No results found for: "VD25OH", "VD125OH2TOT", "OM6004HT9", "HF4142LT5", "32YEBXID5", "25OHVITD2", "68SHUOHF2", "TESTOFREE", "TESTOSTERONE"   Endocrine Lab Results  Component Value Date   GLUCOSE 102 (H) 02/10/2021   GLUCOSEU NEGATIVE 02/10/2021   TSH 1.880 04/27/2017  Neuropathy No results found for: "VITAMINB12", "FOLATE", "HGBA1C", "HIV"   CNS No results found for: "COLORCSF", "APPEARCSF", "RBCCOUNTCSF", "WBCCSF", "POLYSCSF", "LYMPHSCSF", "EOSCSF", "PROTEINCSF", "GLUCCSF", "JCVIRUS", "CSFOLI", "IGGCSF", "LABACHR", "ACETBL"   Inflammation (CRP: Acute  ESR: Chronic) Lab Results  Component Value Date   CRP 0.5 02/10/2021   ESRSEDRATE 15 02/10/2021     Rheumatology No results found for: "RF", "ANA", "LABURIC", "URICUR", "LYMEIGGIGMAB", "LYMEABIGMQN", "HLAB27"   Coagulation Lab Results  Component Value Date   PLT 394 02/10/2021     Cardiovascular Lab Results  Component Value Date   HGB 15.3 (H)  02/10/2021   HCT 43.6 02/10/2021     Screening Lab Results  Component Value Date   SARSCOV2NAA NEGATIVE 02/10/2021   PREGTESTUR NEGATIVE 02/21/2021     Cancer No results found for: "CEA", "CA125", "LABCA2"   Allergens No results found for: "ALMOND", "APPLE", "ASPARAGUS", "AVOCADO", "BANANA", "BARLEY", "BASIL", "BAYLEAF", "GREENBEAN", "LIMABEAN", "WHITEBEAN", "BEEFIGE", "REDBEET", "BLUEBERRY", "BROCCOLI", "CABBAGE", "MELON", "CARROT", "CASEIN", "CASHEWNUT", "CAULIFLOWER", "CELERY"     Note: Lab results reviewed.  [place]  Drug: Ms. Rishel  reports no history of drug use. Alcohol:  reports current alcohol use. Tobacco:  reports that she has never smoked. She has never used smokeless tobacco. Medical:  has a past medical history of Anxiety, Asthma, Bronchitis, chronic ([place]), Carpal tunnel syndrome, COPD (chronic obstructive pulmonary disease) ([place]), Depression, Disc degeneration, lumbosacral, GERD (gastroesophageal reflux disease), Hypertension, Hypothyroidism, Migraine, Osteoporosis, Pre-diabetes, Sleep apnea, and Thyroid disease. Family: family history includes Cancer in her paternal aunt and paternal uncle; Depression in her mother; Heart attack in her father; Heart disease in her father and mother; Hypertension in her mother; Thyroid disease in her mother.  Past Surgical History:  Procedure Laterality Date   CARPAL TUNNEL RELEASE     bilateral   CARPAL TUNNEL RELEASE Right 10/20/2017   Procedure: CARPAL TUNNEL RELEASE;  Surgeon: Leanor Kail, MD;  Location: [facility] ORS;  Service: Orthopedics;  Laterality: Right;   CESAREAN SECTION     COLONOSCOPY WITH PROPOFOL N/A 02/21/2021   Procedure: COLONOSCOPY WITH PROPOFOL;  Surgeon: Annamaria Helling, DO;  Location[facility] ENDOSCOPY;  Service: Gastroenterology;  Laterality: N/A;  SPANISH INTERPRETER   EYE SURGERY     simple surgery, not cataract   FLAT FOOT CORRECTION Left 07/19/2020   Procedure: Evans/MCDO-L;  Surgeon: [name],  DPM;  Location: [facility] ORS;  Service: Podiatry;  Laterality: Left;   FOOT SURGERY Left    screw in toe   GASTROC RECESSION EXTREMITY Left 07/19/2020   Procedure: GASTROC RECESSION EXTREMITY;  Surgeon: [name], DPM;  Location: [facility] ORS;  Service: Podiatry;  Laterality: Left;   LAPAROSCOPIC GASTRIC SLEEVE RESECTION  10/2019   TENDON TRANSFER Left 07/19/2020   Procedure: TENDON TRANSFER FDL transfer; Deep-L;  Surgeon: [name], DPM;  Location: [facility] ORS;  Service: Podiatry;  Laterality: Left;   Active Ambulatory Problems    Diagnosis Date Noted   Chronic radicular lumbar pain 11/25/2021   Cervical radicular pain 11/25/2021   Chronic pain syndrome 11/25/2021   Resolved Ambulatory Problems    Diagnosis Date Noted   No Resolved Ambulatory Problems   Past Medical History:  Diagnosis Date   Anxiety    Asthma    Bronchitis, chronic (HCC)    Carpal tunnel syndrome    COPD (chronic obstructive pulmonary disease) (HCC)    Depression    Disc degeneration, lumbosacral    GERD (gastroesophageal reflux disease)    Hypertension    Hypothyroidism    Migraine  Osteoporosis    Pre-diabetes    Sleep apnea    Thyroid disease    Constitutional Exam  General appearance: Well nourished, well developed, and well hydrated. In no apparent acute distress Vitals:   11/25/21 1331  BP: 101/74  Pulse: 80  Resp: 17  Temp: (!) 97.3 F (36.3 C)  TempSrc: Temporal  SpO2: 97%  Weight: 180 lb (81.6 kg)  Height: '5\' 1"'  (1.549 m)   BMI Assessment: Estimated body mass index is 34.01 kg/m as calculated from the following:   Height as of this encounter: '5\' 1"'  (1.549 m).   Weight as of this encounter: 180 lb (81.6 kg).  BMI interpretation table: BMI level Category Range association with higher incidence of chronic pain  <18 kg/m2 Underweight   18.5-24.9 kg/m2 Ideal body weight   25-29.9 kg/m2 Overweight Increased incidence by 20%  30-34.9 kg/m2 Obese (Class [name]) Increased incidence by 68%   35-39.9 kg/m2 Severe obesity (Class II) Increased incidence by 136%  >40 kg/m2 Extreme obesity (Class III) Increased incidence by 254%   Patient's current BMI Ideal Body weight  Body mass index is 34.01 kg/m. Ideal body weight: 47.8 kg (105 lb 6.1 oz) Adjusted ideal body weight: 61.3 kg (135 lb 3.6 oz)   BMI Readings from Last 4 Encounters:  11/25/21 34.01 kg/m  02/21/21 34.76 kg/m  02/10/21 35.35 kg/m  01/20/21 34.29 kg/m   Wt Readings from Last 4 Encounters:  11/25/21 180 lb (81.6 kg)  02/21/21 178 lb (80.7 kg)  02/10/21 181 lb (82.1 kg)  01/20/21 181 lb 8 oz (82.3 kg)    Psych/Mental status: Alert, oriented x 3 (person, place, & time)       Eyes: PERLA Respiratory: No evidence of acute respiratory distress  Cervical Spine Area Exam  Skin & Axial Inspection: No masses, redness, edema, swelling, or associated skin lesions Alignment: Symmetrical Functional ROM: Pain restricted ROM, bilaterally Stability: No instability detected Muscle Tone/Strength: Functionally intact. No obvious neuro-muscular anomalies detected. Sensory (Neurological): Neurogenic pain pattern Palpation: No palpable anomalies             Upper Extremity (UE) Exam    Side: Right upper extremity  Side: Left upper extremity  Skin & Extremity Inspection: Skin color, temperature, and hair growth are WNL. No peripheral edema or cyanosis. No masses, redness, swelling, asymmetry, or associated skin lesions. No contractures.  Skin & Extremity Inspection: Skin color, temperature, and hair growth are WNL. No peripheral edema or cyanosis. No masses, redness, swelling, asymmetry, or associated skin lesions. No contractures.  Functional ROM: Pain restricted ROM for all joints of upper extremity  Functional ROM: Pain restricted ROM for all joints of upper extremity  Muscle Tone/Strength: Functionally intact. No obvious neuro-muscular anomalies detected.  Muscle Tone/Strength: Functionally intact. No obvious  neuro-muscular anomalies detected.  Sensory (Neurological): Neurogenic pain pattern          Sensory (Neurological): Neurogenic pain pattern          Palpation: No palpable anomalies              Palpation: No palpable anomalies              Provocative Test(s):  Phalen's test: deferred Tinel's test: deferred Apley's scratch test (touch opposite shoulder):  Action 1 (Across chest): deferred Action 2 (Overhead): deferred Action 3 (LB reach): deferred   Provocative Test(s):  Phalen's test: deferred Tinel's test: deferred Apley's scratch test (touch opposite shoulder):  Action 1 (Across chest): Decreased ROM Action 2 (Overhead):  Decreased ROM Action 3 (LB reach):Decreased ROM    Lumbar Spine Area Exam  Skin & Axial Inspection: No masses, redness, or swelling Alignment: Symmetrical Functional ROM: Pain restricted ROM affecting both sides Stability: No instability detected Muscle Tone/Strength: Functionally intact. No obvious neuro-muscular anomalies detected. Sensory (Neurological): Neurogenic pain pattern  Gait & Posture Assessment  Ambulation: Unassisted Gait: Relatively normal for age and body habitus Posture: WNL  Lower Extremity Exam    Side: Right lower extremity  Side: Left lower extremity  Stability: No instability observed          Stability: No instability observed          Skin & Extremity Inspection: Skin color, temperature, and hair growth are WNL. No peripheral edema or cyanosis. No masses, redness, swelling, asymmetry, or associated skin lesions. No contractures.  Skin & Extremity Inspection: Skin color, temperature, and hair growth are WNL. No peripheral edema or cyanosis. No masses, redness, swelling, asymmetry, or associated skin lesions. No contractures.  Functional ROM: Unrestricted ROM                  Functional ROM: Unrestricted ROM                  Muscle Tone/Strength: Functionally intact. No obvious neuro-muscular anomalies detected.  Muscle Tone/Strength:  Functionally intact. No obvious neuro-muscular anomalies detected.  Sensory (Neurological): Unimpaired        Sensory (Neurological): Unimpaired        DTR: Patellar: deferred today Achilles: deferred today Plantar: deferred today  DTR: Patellar: deferred today Achilles: deferred today Plantar: deferred today  Palpation: No palpable anomalies  Palpation: No palpable anomalies    Assessment  Primary Diagnosis & Pertinent Problem List: The primary encounter diagnosis was Cervical radicular pain. Diagnoses of Chronic radicular lumbar pain and Chronic pain syndrome were also pertinent to this visit.  Visit Diagnosis (New problems to examiner): 1. Cervical radicular pain   2. Chronic radicular lumbar pain   3. Chronic pain syndrome    Plan of Care (Initial workup plan)  Discussed treatment options for patient's cervical radiculopathy.  She is being referred here for a intralaminar cervical epidural steroid injection.  [name] discussed this with her including risks and benefits and patient would like to proceed.  [name] also recommend that she increase her Lyrica to 50 mg twice a day.  [name] discussed lumbar spinal cord stimulation for chronic lumbar radicular pain and right lower extremity neuropathic pain.  Patient states that she is prone to infections given her history of diabetes and would like to avoid any implants.  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  [name] explained to her that [name] will be focusing primarily on nonopioid and interventional based pain management.  Plan for cervical ESI as below.    Procedure Orders         Cervical Epidural Injection     Pharmacotherapy (current): Medications ordered:  Meds ordered this encounter  Medications   pregabalin (LYRICA) 25 MG capsule    Sig: Take 2 capsules (50 mg total) by mouth 2 (two) times daily.    Dispense:  120 capsule    Refill:  1   Medications administered during this visit: Ronnell [name]. [name] had no medications  administered during this visit.   Analgesic Pharmacological management:  Opioid Analgesics: We no longer take patients for opioid medication management. If requested, [name] will be evaluated for this type of pharmacotherapy. The evaluation will assess the risks and  indications of the therapy, as they apply to this particular patient. We may provide recommendations on medication, dose, schedule, and monitoring. The prescribing physician will ultimately decide, based on his/her training and level of comfort whether to adopt any of the recommendations, including the prescribing of such medicines.  Membrane stabilizer:  Increase Lyrica to 50 mg twice a day  Muscle relaxant: To be determined at a later time  NSAID: To be determined at a later time  Other analgesic(s): To be determined at a later time   Interventional management options: Ms. Strome was informed that there is no guarantee that she would be a candidate for interventional therapies. The decision will be based on the results of diagnostic studies, as well as Ms. Christoffel's risk profile.  Procedure(s) under consideration:  Cervical epidural steroid injection Spinal cord stimulator trial   [name] spent a total of 60 minutes reviewing chart data, face-to-face evaluation with the patient, counseling and coordination of care as detailed above.   Provider-requested follow-up: Return in about 22 days (around 12/17/2021) for C-ESI, in clinic [facility].  No future appointments.  Note by: Shannon Santa, MD Date: 11/25/2021; Time: 3:15 PM

## 2021-11-25 NOTE — Progress Notes (Signed)
Safety precautions to be maintained throughout the outpatient stay will include: orient to surroundings, keep bed in low position, maintain call bell within reach at all times, provide assistance with transfer out of bed and ambulation.  

## 2021-11-27 ENCOUNTER — Ambulatory Visit: Payer: Medicare Other | Admitting: Student in an Organized Health Care Education/Training Program

## 2021-12-10 ENCOUNTER — Encounter: Payer: Self-pay | Admitting: Student in an Organized Health Care Education/Training Program

## 2021-12-22 ENCOUNTER — Encounter: Payer: Self-pay | Admitting: Student in an Organized Health Care Education/Training Program

## 2021-12-22 ENCOUNTER — Ambulatory Visit
Admission: RE | Admit: 2021-12-22 | Discharge: 2021-12-22 | Disposition: A | Discharge status: Home / Self Care | Payer: Medicare Other | Source: Ambulatory Visit | Attending: Student in an Organized Health Care Education/Training Program | Admitting: Student in an Organized Health Care Education/Training Program

## 2021-12-22 ENCOUNTER — Ambulatory Visit
Payer: Medicare Other | Attending: Student in an Organized Health Care Education/Training Program | Admitting: Student in an Organized Health Care Education/Training Program

## 2021-12-22 DIAGNOSIS — M5412 Radiculopathy, cervical region: Secondary | ICD-10-CM | POA: Diagnosis present

## 2021-12-22 DIAGNOSIS — G894 Chronic pain syndrome: Secondary | ICD-10-CM | POA: Insufficient documentation

## 2021-12-22 MED ORDER — ROPIVACAINE HCL 2 MG/ML IJ SOLN
1.0000 mL | Freq: Once | INTRAMUSCULAR | Status: AC
  Administered 2021-12-22: 1 mL via EPIDURAL

## 2021-12-22 MED ORDER — SODIUM CHLORIDE (PF) 0.9 % IJ SOLN
INTRAMUSCULAR | Status: AC
  Filled 2021-12-22: qty 10

## 2021-12-22 MED ORDER — SODIUM CHLORIDE 0.9% FLUSH
1.0000 mL | Freq: Once | INTRAVENOUS | Status: AC
  Administered 2021-12-22: 1 mL

## 2021-12-22 MED ORDER — ROPIVACAINE HCL 2 MG/ML IJ SOLN
INTRAMUSCULAR | Status: AC
  Filled 2021-12-22: qty 20

## 2021-12-22 MED ORDER — DEXAMETHASONE SODIUM PHOSPHATE 10 MG/ML IJ SOLN
10.0000 mg | Freq: Once | INTRAMUSCULAR | Status: AC
  Administered 2021-12-22: 10 mg

## 2021-12-22 MED ORDER — DEXAMETHASONE SODIUM PHOSPHATE 10 MG/ML IJ SOLN
INTRAMUSCULAR | Status: AC
  Filled 2021-12-22: qty 1

## 2021-12-22 MED ORDER — LIDOCAINE HCL 2 % IJ SOLN
20.0000 mL | Freq: Once | INTRAMUSCULAR | Status: AC
  Administered 2021-12-22: 400 mg

## 2021-12-22 MED ORDER — IOHEXOL 180 MG/ML  SOLN
INTRAMUSCULAR | Status: AC
  Filled 2021-12-22: qty 20

## 2021-12-22 MED ORDER — IOHEXOL 180 MG/ML  SOLN
10.0000 mL | Freq: Once | INTRAMUSCULAR | Status: AC
  Administered 2021-12-22: 10 mL via EPIDURAL

## 2021-12-22 MED ORDER — LIDOCAINE HCL 2 % IJ SOLN
INTRAMUSCULAR | Status: AC
  Filled 2021-12-22: qty 20

## 2021-12-22 NOTE — Progress Notes (Signed)
Safety precautions to be maintained throughout the outpatient stay will include: orient to surroundings, keep bed in low position, maintain call bell within reach at all times, provide assistance with transfer out of bed and ambulation.  

## 2021-12-22 NOTE — Patient Instructions (Signed)

## 2021-12-22 NOTE — Progress Notes (Signed)
PROVIDER NOTE: Interpretation of information contained herein should be left to medically-trained personnel. Specific patient instructions are provided elsewhere under "Patient Instructions" section of medical record. This document was created in part using STT-dictation technology, any transcriptional errors that may result from this process are unintentional.  Patient: [name] Type: Established DOB: Oct 10, 1970 MRN: [id number] PCP: Valera Castle, MD  Service: Procedure DOS: 12/22/2021 Setting: Ambulatory Location: Ambulatory outpatient facility Delivery: Face-to-face Provider: Gillis Santa, MD Specialty: Interventional Pain Management Specialty designation: 09 Location: Outpatient facility Ref. Prov.: Gillis Santa, MD   Procedure [facility] )   Type: Cervical Epidural Steroid injection (ESI) (Interlaminar) #1  Laterality: Right  Level: C7-T1 Imaging: Fluoroscopy-assisted DOS: 12/22/2021  Performed by: Gillis Santa, MD Anesthesia: Local anesthesia (1-2% Lidocaine)  Purpose: Diagnostic/Therapeutic Indications: Cervicalgia, cervical radicular pain, degenerative disc disease, severe enough to impact quality of life or function. 1. Cervical radicular pain   2. Chronic pain syndrome    NAS-11 score:   Pre-procedure: 7 /10   Post-procedure: 0-No pain/10      Pre-Procedure Preparation  Monitoring: As per clinic protocol. Respiration, ETCO2, SpO2, BP, heart rate and rhythm monitor placed and checked for adequate function  Risk Assessment: Vitals:  [id number] body mass index is 33.63 kg/m as calculated from the following:   Height as of this encounter: '5\' 1"'$  (1.549 m).   Weight as of this encounter: 178 lb (80.7 kg)., Rate:78 , BP:115/81, Resp: , Temp:(!) 97.3 F (36.3 C), SpO2:97 %  Allergies: She is allergic to morphine and related, percocet [oxycodone-acetaminophen], and toradol [ketorolac tromethamine].  Precautions: None  required  Blood-thinner(s): None at this time  Coagulopathies: Reviewed. None identified.   Active Infection(s): Reviewed. None identified. [name] is afebrile   Location setting: Procedure suite Position: Prone, on modified reverse trendelenburg to facilitate breathing, with head in head-cradle. Pillows positioned under chest (below chin-level) with cervical spine flexed. Safety Precautions: Patient was assessed for positional comfort and pressure points before starting the procedure. Prepping solution: DuraPrep (Iodine Povacrylex [0.7% available iodine] and Isopropyl Alcohol, 74% w/w) Prep Area: Entire  cervicothoracic region Approach: percutaneous, paramedial Intended target: Posterior cervical epidural space Materials: Tray: Epidural Needle(s): Epidural (Tuohy) Qty: 1 Length: (52m) 3.5-inch Gauge: 22G   Meds ordered this encounter  Medications   iohexol (OMNIPAQUE) 180 MG/ML injection 10 mL    Must be Myelogram-compatible. If not available, you may substitute with a water-soluble, non-ionic, hypoallergenic, myelogram-compatible radiological contrast medium.   lidocaine (XYLOCAINE) 2 % (with pres) injection 400 mg   sodium chloride flush (NS) 0.9 % injection 1 mL   ropivacaine (PF) 2 mg/mL (0.2%) (NAROPIN) injection 1 mL   dexamethasone (DECADRON) injection 10 mg    Orders Placed This Encounter  Procedures   DG PAIN CLINIC C-ARM 1-60 MIN NO REPORT    Intraoperative interpretation by procedural physician at [facility]    Standing Status:   Standing    Number of Occurrences:   1    Order Specific Question:   Reason for exam:    Answer:   Assistance in needle guidance and placement for procedures requiring needle placement in or near specific anatomical locations not easily accessible without such assistance.     Time-out: 1155 I initiated and conducted the "Time-out" before starting the procedure, as per protocol. The patient was asked to participate by  confirming the accuracy of the "Time Out" information. Verification of the correct person, site, and procedure were performed and confirmed by me, the  nursing staff, and the patient. "Time-out" conducted as per Joint Commission's Universal Protocol (UP.01.01.01). Procedure checklist: Completed   H&P (Pre-op  Assessment)  [name] is a 51 y.o. (year old), female patient, seen today for interventional treatment. 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 [name] has a current medication list which includes the following prescription(s): acetaminophen, albuterol, albuterol, benzonatate, budesonide-formoterol, calcium carbonate, carvedilol, cholecalciferol, clonazepam, cyanocobalamin, cyclobenzaprine, famotidine, fluoxetine, fluticasone, guaifenesin, hydrochlorothiazide, hyoscyamine, levalbuterol, levothyroxine, loratadine, medroxyprogesterone, metformin, melatonin, montelukast, multiple vitamins-minerals, nitroglycerin, nystatin ointment, oxycodone-acetaminophen, oxycodone-acetaminophen, potassium gluconate, pregabalin, promethazine-dextromethorphan, spiriva respimat, and [DISCONTINUED] gabapentin. Her primarily concern today is the Neck Pain  She is allergic to morphine and related, percocet [oxycodone-acetaminophen], and toradol [ketorolac tromethamine].   Last encounter: My last encounter with her was on 11/25/2021. Pertinent problems: [name] does not have any pertinent problems on file. Pain Assessment: Severity of  (moving and putting on shirt) is reported as a 7 /10. Location: Neck  /to shoulders bilaterally more in right. Onset: More than a month ago.  Quality: Constant, Aching, Burning. Timing: Constant. Modifying factor(s): injections. Vitals:  height is '5\' 1"'$  (1.549 m) and weight is 178 lb (80.7 kg). Her temporal temperature is 97.3 F (36.3 C) (abnormal). Her blood pressure is 102/76 and her pulse is 81. Her oxygen saturation is 99%.   Reason for encounter: Interventional pain management therapy due pain of at least four (4) weeks in duration, with to failure to respond to and/or inability to tolerate more conservative care.   Site Confirmation: [name] was asked to confirm the procedure and laterality before marking the site.  Consent: Before the procedure and under the influence of no sedative(s), amnesic(s), or anxiolytics, the patient was informed of the treatment options, risks and possible complications. To fulfill our ethical and legal obligations, as recommended by the American Medical Association's Code of Ethics, I have informed the patient of my clinical impression; the nature and purpose of the treatment or procedure; the risks, benefits, and possible complications of the intervention; the alternatives, including doing nothing; the risk(s) and benefit(s) of the alternative treatment(s) or procedure(s); and the risk(s) and benefit(s) of doing nothing. The patient was provided information about the general risks and possible complications associated with the procedure. These may include, but are not limited to: failure to achieve desired goals, infection, bleeding, organ or nerve damage, allergic reactions, paralysis, and death. In addition, the patient was informed of those risks and complications associated to Spine-related procedures, such as failure to decrease pain; infection (i.e.: Meningitis, epidural or intraspinal abscess); bleeding (i.e.: epidural hematoma, subarachnoid hemorrhage, or any other type of intraspinal or peri-dural bleeding); organ or nerve damage (i.e.: Any type of peripheral nerve, nerve root, or spinal cord  injury) with subsequent damage to sensory, motor, and/or autonomic systems, resulting in permanent pain, numbness, and/or weakness of one or several areas of the body; allergic reactions; (i.e.: anaphylactic reaction); and/or death. Furthermore, the patient was informed of those risks and complications associated with the medications. These include, but are not limited to: allergic reactions (i.e.: anaphylactic or anaphylactoid reaction(s)); adrenal axis suppression; blood sugar elevation that in diabetics may result in ketoacidosis or comma; water retention that in patients with history of congestive heart failure may result in shortness  of breath, pulmonary edema, and decompensation with resultant heart failure; weight gain; swelling or edema; medication-induced neural toxicity; particulate matter embolism and blood vessel occlusion with resultant organ, and/or nervous system infarction; and/or aseptic necrosis of one or more joints. Finally, the patient was informed that Medicine is not an exact science; therefore, there is also the possibility of unforeseen or unpredictable risks and/or possible complications that may result in a catastrophic outcome. The patient indicated having understood very clearly. We have given the patient no guarantees and we have made no promises. Enough time was given to the patient to ask questions, all of which were answered to the patient's satisfaction. [name] has indicated that she wanted to continue with the procedure. Attestation: I, the ordering provider, attest that I have discussed with the patient the benefits, risks, side-effects, alternatives, likelihood of achieving goals, and potential problems during recovery for the procedure that I have provided informed consent.  Date  Time: 12/22/2021 11:33 AM   Prophylactic antibiotics  Anti-infectives (From admission, onward)    None      Indication(s): None identified   Description of procedure   Start  Time: 1155 hrs  Local Anesthesia: Once the patient was positioned, prepped, and time-out was completed. The target area was identified located. The skin was marked with an approved surgical skin marker. Once marked, the skin (epidermis, dermis, and hypodermis), and deeper tissues (fat, connective tissue and muscle) were infiltrated with a small amount of a short-acting local anesthetic, loaded on a 10cc syringe with a 25G, 1.5-in  Needle. An appropriate amount of time was allowed for local anesthetics to take effect before proceeding to the next step. Local Anesthetic: Lidocaine 1-2% The unused portion of the local anesthetic was discarded in the proper designated containers. Safety Precautions: Aspiration looking for blood return was conducted prior to all injections. At no point did I inject any substances, as a needle was being advanced. Before injecting, the patient was told to immediately notify me if she was experiencing any new onset of "ringing in the ears, or metallic taste in the mouth". No attempts were made at seeking any paresthesias. Safe injection practices and needle disposal techniques used. Medications properly checked for expiration dates. SDV (single dose vial) medications used. After the completion of the procedure, all disposable equipment used was discarded in the proper designated medical waste containers.  Technical description: Protocol guidelines were followed. Using fluoroscopic guidance, the epidural needle was introduced through the skin, ipsilateral to the reported pain, and advanced to the target area. Posterior laminar os was contacted and the needle walked caudad, until the lamina was cleared. The ligamentum flavum was engaged and the epidural space identified using "loss-of-resistance technique" with 2-3 ml of PF-NaCl (0.9% NSS), in a 5cc dedicated LOR syringe. See "Imaging guidance" below for use of contrast details.   3.5cc solution made of 1.5 cc of preservative-free  saline, 1 cc of 0.2% ropivacaine, 1 cc of Decadron 10 mg/cc.   Injection: Once satisfactory needle placement was confirmed, I proceeded to inject the desired solution in slow, incremental fashion, intermittently assessing for discomfort or any signs of abnormal or undesired spread of substance. Once completed, the needle was removed and disposed of, as per hospital protocols.   Vitals:   12/22/21 1139 12/22/21 1154 12/22/21 1159 12/22/21 1204  BP: 115/81 113/77 98/80 102/76  Pulse: 78 80 81 81  Temp: (!) 97.3 F (36.3 C)     TempSrc: Temporal     SpO2: 97% 98%  99% 99%  Weight: 178 lb (80.7 kg)     Height: '5\' 1"'$  (1.549 m)       End Time: 1201 hrs  Once the entire procedure was completed, the treated area was cleaned, making sure to leave some of the prepping solution back to take advantage of its long term bactericidal properties.   Imaging guidance  Type of Imaging Technique: Fluoroscopy Guidance (Spinal) Indication(s): Assistance in needle guidance and placement for procedures requiring needle placement in or near specific anatomical locations not easily accessible without such assistance. Exposure Time: Please see nurses notes for exact fluoroscopy time. Contrast: Before injecting any contrast, we confirmed that the patient did not have an allergy to iodine, shellfish, or radiological contrast. Once satisfactory needle placement was completed, radiological contrast was injected under continuous fluoroscopic guidance. Injection of contrast accomplished without complications. See chart for type and volume of contrast used. Fluoroscopic Guidance: I was personally present in the fluoroscopy suite, where the patient was placed in position for the procedure, over the fluoroscopy-compatible table. Fluoroscopy was manipulated, using "Tunnel Vision Technique", to obtain the best possible view of the target area, on the affected side. Parallax error was corrected before commencing the procedure. A  "direction-depth-direction" technique was used to introduce the needle under continuous pulsed fluoroscopic guidance. Once the target was reached, antero-posterior, oblique, and lateral fluoroscopic projection views were taken to confirm needle placement in all planes. Electronic images uploaded into EMR.  Interpretation: Successful epidural injection. Intraoperative imaging interpretation by performing Physician.    Post-op assessment  Post-procedure Vital Signs:  Pulse/HCG Rate: 81  Temp: (!) 97.3 F (36.3 C) Resp:   BP: 102/76 SpO2: 99 %  EBL: None  Complications: No immediate post-treatment complications observed by team, or reported by patient.  Note: The patient tolerated the entire procedure well. A repeat set of vitals were taken after the procedure and the patient was kept under observation following institutional policy, for this type of procedure. Post-procedural neurological assessment was performed, showing return to baseline, prior to discharge. The patient was provided with post-procedure discharge instructions, including a section on how to identify potential problems. Should any problems arise concerning this procedure, the patient was given instructions to immediately contact us, at any time, without hesitation. In any case, we plan to contact the patient by telephone for a follow-up status report regarding this interventional procedure.  Comments:  No additional relevant information.   Plan of care  5 out of 5 strength bilateral upper extremity: Shoulder abduction, elbow flexion, elbow extension, thumb extension.   Medications administered: We administered iohexol, lidocaine, sodium chloride flush, ropivacaine (PF) 2 mg/mL (0.2%), and dexamethasone.  Follow-up plan:   Return in about 4 weeks (around 01/19/2022) for Post Procedure Evaluation, in person.      R C-ESI 12/22/21   Recent Visits Date Type Provider Dept  11/25/21 Office Visit Gillis Santa, MD [facility]  Showing recent visits within past 90 days and meeting all other requirements Today's Visits Date Type Provider Dept  12/22/21 Procedure visit Gillis Santa, MD [facility]  Showing today's visits and meeting all other requirements Future Appointments Date Type Provider Dept  01/29/22 Appointment Gillis Santa, MD [facility]  Showing future appointments within next 90 days and meeting all other requirements   Disposition: Discharge home  Discharge (Date  Time): 12/22/2021; 1207 hrs.   Primary Care Physician: Valera Castle, MD Location: [facility] Note by: Gillis Santa, MD Date: 12/22/2021;  Time: 2:03 PM  DISCLAIMER: Medicine is not an Chief Strategy Officer. It has no guarantees or warranties. The decision to proceed with this intervention was based on the information collected from the patient. Conclusions were drawn from the patient's questionnaire, interview, and examination. Because information was provided in large part by the patient, it cannot be guaranteed that it has not been purposely or unconsciously manipulated or altered. Every effort has been made to obtain as much accurate, relevant, available data as possible. Always take into account that the treatment will also be dependent on availability of resources and existing treatment guidelines, considered by other Pain Management Specialists as being common knowledge and practice, at the time of the intervention. It is also important to point out that variation in procedural techniques and pharmacological choices are the acceptable norm. For Medico-Legal review purposes, the indications, contraindications, technique, and results of the these procedures should only be evaluated, judged and interpreted by a Board-Certified Interventional Pain Specialist with extensive familiarity and expertise in the same exact procedure and technique.

## 2021-12-23 ENCOUNTER — Telehealth: Payer: Self-pay | Admitting: *Deleted

## 2021-12-23 NOTE — Telephone Encounter (Signed)
No problems post procedure. 

## 2022-01-29 ENCOUNTER — Encounter: Payer: Self-pay | Admitting: Student in an Organized Health Care Education/Training Program

## 2022-01-29 ENCOUNTER — Encounter: Payer: Medicare Other | Admitting: Student in an Organized Health Care Education/Training Program

## 2022-03-24 ENCOUNTER — Other Ambulatory Visit: Payer: Self-pay | Admitting: Podiatry

## 2022-03-24 DIAGNOSIS — M722 Plantar fascial fibromatosis: Secondary | ICD-10-CM

## 2022-03-24 DIAGNOSIS — M7672 Peroneal tendinitis, left leg: Secondary | ICD-10-CM

## 2022-03-30 ENCOUNTER — Ambulatory Visit
Admission: RE | Admit: 2022-03-30 | Discharge: 2022-03-30 | Disposition: A | Discharge status: Home / Self Care | Payer: 59 | Source: Ambulatory Visit | Attending: Podiatry | Admitting: Podiatry

## 2022-03-30 DIAGNOSIS — M7672 Peroneal tendinitis, left leg: Secondary | ICD-10-CM

## 2022-03-30 DIAGNOSIS — M722 Plantar fascial fibromatosis: Secondary | ICD-10-CM

## 2022-06-18 ENCOUNTER — Ambulatory Visit: Payer: 59 | Admitting: Student in an Organized Health Care Education/Training Program

## 2022-07-07 ENCOUNTER — Ambulatory Visit: Payer: 59 | Admitting: Student in an Organized Health Care Education/Training Program

## 2022-07-28 ENCOUNTER — Ambulatory Visit
Admission: EM | Admit: 2022-07-28 | Discharge: 2022-07-28 | Payer: 59 | Attending: Emergency Medicine | Admitting: Emergency Medicine

## 2022-07-28 DIAGNOSIS — F419 Anxiety disorder, unspecified: Secondary | ICD-10-CM | POA: Diagnosis not present

## 2022-07-28 DIAGNOSIS — R079 Chest pain, unspecified: Secondary | ICD-10-CM | POA: Diagnosis not present

## 2022-07-28 MED ORDER — ASPIRIN 81 MG PO CHEW
324.0000 mg | CHEWABLE_TABLET | Freq: Once | ORAL | Status: AC
  Administered 2022-07-28: 324 mg via ORAL

## 2022-07-28 NOTE — Discharge Instructions (Addendum)
Go to ER now for further evaluation of chest pain, do not eat or drink anything.

## 2022-07-28 NOTE — ED Notes (Signed)
Patient is being discharged from the Urgent Care and sent to the Emergency Department via personal vehicle . Per Clancy Gourd NP, patient is in need of higher level of care due to chest pain. Patient is aware and verbalizes understanding of plan of care.  Vitals:   07/28/22 1642  BP: 128/87  Pulse: 84  Temp: 97.9 F (36.6 C)  SpO2: 97%

## 2022-07-28 NOTE — ED Triage Notes (Signed)
Pt presents to UC c/o chest pain intermittent since Saturday, pt states chest pain has been constant today, pt states she went to the gym and couldn't complete workout because chest pain got that bad. Pt reports she does have hx of hypertension. Pt states LT arm feels numb.

## 2022-07-28 NOTE — ED Provider Notes (Signed)
[facility]    CSN: [id number] Arrival date & time: 07/28/22  1632      History   Chief Complaint Chief Complaint  Patient presents with   Chest Pain    HPI [name] is a 52 y.o. female.   [name], [name], presents to urgent care complaining of intermittent chest pain x 3 days. [name] also c/o left arm numbness,[name] shaking hand during exam.  PMH of Asthma,anxiety,GERD, COPD, hypertension, hypothyroidism,cervical radicular pain,chronic pain syndrome  The history is provided by the patient and a relative. No language interpreter was used.    Past Medical History:  Diagnosis Date   Anxiety    Asthma    Bronchitis, chronic (HCC)    Carpal tunnel syndrome    COPD (chronic obstructive pulmonary disease) (HCC)    Depression    Disc degeneration, lumbosacral    GERD (gastroesophageal reflux disease)    Hypertension    Hypothyroidism    Migraine    Osteoporosis    Pre-diabetes    Sleep apnea    uses cpap   Thyroid disease     Patient Active Problem List   Diagnosis Date Noted   Intermittent chest pain 07/28/2022   Anxiety 07/28/2022   Chronic radicular lumbar pain 11/25/2021   Cervical radicular pain 11/25/2021   Chronic pain syndrome 11/25/2021    Past Surgical History:  Procedure Laterality Date   CARPAL TUNNEL RELEASE     bilateral   CARPAL TUNNEL RELEASE Right 10/20/2017   Procedure: CARPAL TUNNEL RELEASE;  Surgeon: Erin Sons, MD;  Location: [facility] ORS;  Service: Orthopedics;  Laterality: Right;   CESAREAN SECTION     COLONOSCOPY WITH PROPOFOL N/A 02/21/2021   Procedure: COLONOSCOPY WITH PROPOFOL;  Surgeon: Jaynie Collins, DO;  Location: [facility] ENDOSCOPY;  Service: Gastroenterology;  Laterality: N/A;  SPANISH INTERPRETER   EYE SURGERY     simple surgery, not cataract   FLAT FOOT CORRECTION Left 07/19/2020   Procedure: Evans/MCDO-L;  Surgeon: Gwyneth Revels, DPM;  Location: [facility] ORS;  Service: Podiatry;   Laterality: Left;   FOOT SURGERY Left    screw in toe   GASTROC RECESSION EXTREMITY Left 07/19/2020   Procedure: GASTROC RECESSION EXTREMITY;  Surgeon: Gwyneth Revels, DPM;  Location: [facility] ORS;  Service: Podiatry;  Laterality: Left;   LAPAROSCOPIC GASTRIC SLEEVE RESECTION  10/2019   TENDON TRANSFER Left 07/19/2020   Procedure: TENDON TRANSFER FDL transfer; Deep-L;  Surgeon: Gwyneth Revels, DPM;  Location: [facility] ORS;  Service: Podiatry;  Laterality: Left;    OB History   No obstetric history on file.      Home Medications    Prior to Admission medications   Medication Sig Start Date End Date Taking? Authorizing Provider  acetaminophen (TYLENOL) 160 MG/5ML liquid Take 976 mg by mouth every 4 (four) hours as needed for fever.   Yes [provider]  albuterol (PROVENTIL HFA;VENTOLIN HFA) 108 (90 Base) MCG/ACT inhaler Inhale 2 puffs into the lungs every 8 (eight) hours as needed for wheezing or shortness of breath. Last used today, [id number], AM.   Yes [provider]  albuterol (PROVENTIL) (2.5 MG/3ML) 0.083% nebulizer solution Inhale 2.5 mg into the lungs every 6 (six) hours as needed for wheezing or shortness of breath. Last used today, [id number], AM. 05/17/20  Yes [provider]  budesonide-formoterol (SYMBICORT) 160-4.5 MCG/ACT inhaler Inhale 2 puffs into the lungs 2 (two) times daily.   Yes [provider]  calcium carbonate (OSCAL)  1500 (600 Ca) MG TABS tablet Take 600 mg of elemental calcium by mouth daily with breakfast.   Yes [provider]  carvedilol (COREG) 25 MG tablet Take 25 mg by mouth 2 (two) times daily with a meal.   Yes [provider]  cholecalciferol (VITAMIN D3) 25 MCG (1000 UNIT) tablet Take 1,000 Units by mouth daily.   Yes [provider]  clonazePAM (KLONOPIN) 1 MG tablet Take 1 mg by mouth at bedtime. 07/15/17  Yes [provider]  Cyanocobalamin (VITAMIN B-12 PO) Take 1 tablet by mouth daily.   Yes  [provider]  cyclobenzaprine (FLEXERIL) 10 MG tablet Take 10 mg by mouth 3 (three) times daily as needed for muscle spasms.   Yes [provider]  famotidine (PEPCID) 40 MG tablet Take 40 mg by mouth at bedtime.   Yes [provider]  FLUoxetine (PROZAC) 10 MG tablet Take 10 mg by mouth daily.   Yes [provider]  fluticasone (FLONASE) 50 MCG/ACT nasal spray Place 2 sprays into both nostrils daily.   Yes [provider]  guaiFENesin (MUCINEX) 600 MG 12 hr tablet Take 1 tablet (600 mg total) by mouth 2 (two) times daily. 01/20/21  Yes White, Adrienne R, NP  hydrochlorothiazide (HYDRODIURIL) 25 MG tablet Take 25 mg by mouth in the morning.   Yes [provider]  hyoscyamine (LEVSIN SL) 0.125 MG SL tablet Place 0.125 mg under the tongue every 4 (four) hours as needed.   Yes [provider]  levalbuterol (XOPENEX HFA) 45 MCG/ACT inhaler Inhale 2 puffs into the lungs every 4 (four) hours as needed for wheezing.   Yes [provider]  levothyroxine (SYNTHROID) 112 MCG tablet Take 112 mcg by mouth daily before breakfast. 03/02/18  Yes [provider]  loratadine (CLARITIN) 10 MG tablet Take 10 mg by mouth in the morning.   Yes [provider]  medroxyPROGESTERone (PROVERA) 10 MG tablet Take 10 mg by mouth daily.   Yes [provider]  metFORMIN (GLUCOPHAGE-XR) 750 MG 24 hr tablet Take 750 mg by mouth 3 (three) times a week. 06/04/20  Yes [provider]  MM MELATONIN PO Take by mouth.   Yes [provider]  montelukast (SINGULAIR) 10 MG tablet Take 10 mg by mouth at bedtime. 03/16/18  Yes [provider]  Multiple Vitamins-Minerals (BARIATRIC MULTIVITAMINS/IRON PO) Take 1 tablet by mouth daily.   Yes [provider]  nitroGLYCERIN (NITROSTAT) 0.4 MG SL tablet Place 0.4 mg under the tongue every 5 (five) minutes x 3 doses as needed for chest pain.   Yes [provider]  nystatin ointment (MYCOSTATIN) Apply 1 application topically 2 (two) times daily.   Yes [provider]  oxyCODONE-acetaminophen (PERCOCET) 5-325 MG tablet Take 1-2 tablets by mouth every 6 (six) hours as needed for severe pain. Max 6 tabs per day 07/19/20  Yes Gwyneth Revels, DPM  oxyCODONE-acetaminophen (PERCOCET) 5-325 MG tablet Take 1-2 tablets by mouth every 6 (six) hours as needed for severe pain. Max 6 tabs per day 07/19/20  Yes Gwyneth Revels, DPM  Potassium Gluconate 550 MG TABS Take 550 mg by mouth daily with lunch.   Yes [provider]  pregabalin (LYRICA) 25 MG capsule Take 2 capsules (50 mg total) by mouth 2 (two) times daily. 11/25/21  Yes Edward Jolly, MD  Tiotropium Bromide Monohydrate (SPIRIVA RESPIMAT) 1.25 MCG/ACT AERS Inhale into the lungs.   Yes [provider]  benzonatate (TESSALON) 100 MG  capsule Take 1 capsule (100 mg total) by mouth every 8 (eight) hours. 01/20/21   White, Elita Boone, NP  promethazine-dextromethorphan (PROMETHAZINE-DM) 6.25-15 MG/5ML syrup Take 5 mLs by mouth 4 (four) times daily as needed for cough. 01/20/21   Valinda Hoar, NP  gabapentin (NEURONTIN) 400 MG capsule Take 400 mg by mouth every morning.  05/25/19  [provider]    Family History Family History  Problem Relation Age of Onset   Hypertension Mother    Depression Mother    Heart disease Mother    Thyroid disease Mother    Heart disease Father    Heart attack Father    Cancer Paternal Aunt    Cancer Paternal Uncle    Breast cancer Neg Hx     Social History Social History   Tobacco Use   Smoking status: Never   Smokeless tobacco: Never  Vaping Use   Vaping Use: Never used  Substance Use Topics   Alcohol use: Yes    Comment: rarely   Drug use: No     Allergies   Morphine and codeine, Percocet [oxycodone-acetaminophen], and Toradol [ketorolac tromethamine]   Review of Systems Review of Systems  Constitutional:   Negative for fever.  Cardiovascular:  Positive for chest pain.  Neurological:  Positive for numbness.  All other systems reviewed and are negative.    Physical Exam Triage Vital Signs ED Triage Vitals  Enc Vitals Group     BP 07/28/22 1642 128/87     Pulse Rate 07/28/22 1642 84     Resp --      Temp 07/28/22 1642 97.9 F (36.6 C)     Temp Source 07/28/22 1642 Oral     SpO2 07/28/22 1642 97 %     Weight --      Height --      Head Circumference --      Peak Flow --      Pain Score 07/28/22 1640 8     Pain Loc --      Pain Edu? --      Excl. in GC? --    No data found.  Updated Vital Signs BP 128/87 (BP Location: Left Arm)   Pulse 84   Temp 97.9 F (36.6 C) (Oral)   LMP 05/17/2018   SpO2 97%   Visual Acuity Right Eye Distance:   Left Eye Distance:   Bilateral Distance:    Right Eye Near:   Left Eye Near:    Bilateral Near:     Physical Exam Vitals and nursing note reviewed.  Cardiovascular:     Rate and Rhythm: Normal rate and regular rhythm.     Pulses: Normal pulses.          Radial pulses are 2+ on the right side and 2+ on the left side.     Heart sounds: Normal heart sounds.  Pulmonary:     Effort: Pulmonary effort is normal.     Breath sounds: Normal breath sounds and air entry.  Neurological:     General: No focal deficit present.     Mental Status: She is alert and oriented to person, place, and time.     Sensory: Sensation is intact.     Motor: Motor function is intact.     Coordination: Coordination is intact.     Gait: Gait is intact.  Psychiatric:        Attention and Perception: Attention normal.        Mood  and Affect: Mood is anxious.        Speech: Speech normal.        Behavior: Behavior is cooperative.      UC Treatments / Results  Labs (all labs ordered are listed, but only abnormal results are displayed) Labs Reviewed - No data to display  EKG   Radiology No results found.  Procedures Procedures (including critical  care time)  Medications Ordered in UC Medications  aspirin chewable tablet 324 mg (324 mg Oral Given 07/28/22 1657)    Initial Impression / Assessment and Plan / UC Course  I have reviewed the triage vital signs and the nursing notes.  Pertinent labs & imaging results that were available during my care of the patient were reviewed by me and considered in my medical decision making (see chart for details).  Clinical Course as of 07/28/22 1718  Tue Jul 28, 2022  1643 EKG shows NSR rate 82, QTC is 432, no ectopy,no STEMI, compared to 02/11/2021 EKG unchanged. 4 baby asa given in urgent care. [JD]    Clinical Course User Index [JD] Halana Deisher, Para March, NP   Discussed exam findings and EKG results with [name] and family, offered EMS, [name] will have family take to [facility] ER for further evaluation(labs,EKG,cardiac monitoring,etc. [name] requesting referral as she does not want to wait in ER,aware unable to provide referral. [name] and family verbalized understanding to this provider.   Ddx: Intermittent chest pain,cervical radiculopathy,anxiety,GERD Final Clinical Impressions(s) / UC Diagnoses   Final diagnoses:  Intermittent chest pain  Anxiety     Discharge Instructions      Go to ER now for further evaluation of chest pain, do not eat or drink anything.     ED Prescriptions   None    PDMP not reviewed this encounter.   Clancy Gourd, NP 07/28/22 1718

## 2022-08-19 ENCOUNTER — Emergency Department: Payer: 59

## 2022-08-19 ENCOUNTER — Emergency Department
Admission: EM | Admit: 2022-08-19 | Discharge: 2022-08-19 | Disposition: A | Discharge status: Home / Self Care | Payer: 59 | Attending: Emergency Medicine | Admitting: Emergency Medicine

## 2022-08-19 ENCOUNTER — Other Ambulatory Visit: Payer: Self-pay

## 2022-08-19 DIAGNOSIS — J449 Chronic obstructive pulmonary disease, unspecified: Secondary | ICD-10-CM | POA: Diagnosis not present

## 2022-08-19 DIAGNOSIS — I959 Hypotension, unspecified: Secondary | ICD-10-CM | POA: Insufficient documentation

## 2022-08-19 DIAGNOSIS — R079 Chest pain, unspecified: Secondary | ICD-10-CM | POA: Insufficient documentation

## 2022-08-19 DIAGNOSIS — R600 Localized edema: Secondary | ICD-10-CM | POA: Insufficient documentation

## 2022-08-19 DIAGNOSIS — I10 Essential (primary) hypertension: Secondary | ICD-10-CM | POA: Diagnosis not present

## 2022-08-19 LAB — BASIC METABOLIC PANEL
Anion gap: 5 (ref 5–15)
BUN: 16 mg/dL (ref 6–20)
CO2: 27 mmol/L (ref 22–32)
Calcium: 9.2 mg/dL (ref 8.9–10.3)
Chloride: 104 mmol/L (ref 98–111)
Creatinine, Ser: 0.74 mg/dL (ref 0.44–1.00)
GFR, Estimated: >60 mL/min (ref >60)
Glucose, Bld: 91 mg/dL (ref 70–99)
Potassium: 3.6 mmol/L (ref 3.5–5.1)
Sodium: 136 mmol/L (ref 135–145)

## 2022-08-19 LAB — CBC
HCT: 41.4 % (ref 36.0–46.0)
Hemoglobin: 14.9 g/dL (ref 12.0–15.0)
MCH: 33.3 pg (ref 26.0–34.0)
MCHC: 36 g/dL (ref 30.0–36.0)
MCV: 92.4 fL (ref 80.0–100.0)
Platelets: 366 10*3/uL (ref 150–400)
RBC: 4.48 MIL/uL (ref 3.87–5.11)
RDW: 12.4 % (ref 11.5–15.5)
WBC: 6.6 10*3/uL (ref 4.0–10.5)
nRBC: 0 % (ref 0.0–0.2)

## 2022-08-19 LAB — TROPONIN I (HIGH SENSITIVITY): Troponin I (High Sensitivity): 2 ng/L (ref <18)

## 2022-08-19 NOTE — ED Provider Notes (Signed)
[facility] Provider Note    Event Date/Time   First MD Initiated Contact with Patient 08/19/22 1555     (approximate)  History   Chief Complaint: Chest Pain  HPI  Joelee Snoke [place] is a 52 y.o. female with a past medical history 59, COPD, gastric reflux, hypertension, presents to the emergency department for chest discomfort and low blood pressure.  According to the patient she was having a sleep study performed when she began feeling some chest discomfort.  States they had a blood pressure cuff at the facility and they checked her blood pressure and it was 77 systolic.  Patient wanted to drive herself to the emergency department so she left and came here.  Upon arrival here without intervention patient's blood pressure is 121/77.  Patient states the chest discomfort was brief lasting only seconds denies any discomfort currently.  No shortness of breath.  Does state some discomfort in the left arm as well but that has resolved.  Patient states she has had this happen once before.  Has not seen a cardiologist but has an appointment scheduled in approximately 2 weeks.  Physical Exam   Triage Vital Signs: ED Triage Vitals  Encounter Vitals Group     BP 08/19/22 1435 121/77     Systolic BP Percentile --      Diastolic BP Percentile --      Pulse Rate 08/19/22 1435 72     Resp 08/19/22 1435 18     Temp 08/19/22 1435 97.9 F (36.6 C)     Temp Source 08/19/22 1435 Oral     SpO2 08/19/22 1435 94 %     Weight 08/19/22 1433 189 lb (85.7 kg)     Height 08/19/22 1433 5' (1.524 m)     Head Circumference --      Peak Flow --      Pain Score 08/19/22 1433 7     Pain Loc --      Pain Education --      Exclude from Growth Chart --     Most recent vital signs: Vitals:   08/19/22 1435  BP: 121/77  Pulse: 72  Resp: 18  Temp: 97.9 F (36.6 C)  SpO2: 94%    General: Awake, no distress.  CV:  Good peripheral perfusion.  Regular rate and rhythm   Resp:  Normal effort.  Equal breath sounds bilaterally.  Abd:  No distention.  Soft, nontender.  No rebound or guarding. Other:  Lower extremity edema or tenderness.   ED Results / Procedures / Treatments   EKG  EKG viewed and interpreted by myself shows a normal sinus rhythm at 68 bpm with a narrow QRS, left axis deviation, largely normal intervals with nonspecific but no concerning ST changes.  RADIOLOGY  I have reviewed and interpreted the chest x-ray images.  No consolidation seen on my evaluation. Radiology is read the chest x-ray is negative   MEDICATIONS ORDERED IN ED: Medications - No data to display   IMPRESSION / MDM / ASSESSMENT AND PLAN / ED COURSE  I reviewed the triage vital signs and the nursing notes.  Patient's presentation is most consistent with acute presentation with potential threat to life or bodily function.  Patient presents to the emergency department for chest discomfort and reportedly low blood pressure.  Patient states she was finishing a sleep study when this occurred but states she has done this previously without issue.  Patient denies any chest discomfort currently.  Denies any shortness of breath nausea or diaphoresis at any point.  Patient's vital signs including blood pressure are reassuring in the emergency department.  No pleuritic pain.  No leg pain or swelling.  Patient's lab work is reassuring with a normal CBC normal chemistry negative troponin.  Chest x-ray is clear, EKG is normal.  As the symptoms occurred an hour or 2 before arrival to the emergency department did recommend repeating the troponin.  Patient states she is ready to go and would prefer to avoid the repeat troponin.  As the patient's workup is otherwise reassuring I discussed with the patient decreasing her carvedilol from 25 mg twice daily to 12.5 mg twice daily and then following up with her doctor.  Patient is agreeable to plan of care.  Discussed my typical chest pain return  precautions.  FINAL CLINICAL IMPRESSION(S) / ED DIAGNOSES   Chest pain Hypotension    Note:  This document was prepared using Dragon voice recognition software and may include unintentional dictation errors.   Minna Antis, MD 08/19/22 [phone]

## 2022-08-19 NOTE — Discharge Instructions (Signed)
As we discussed please decrease your carvedilol from 25 mg twice daily to 12.5 mg twice daily.  Please follow-up with your doctor.  Return to the emergency department for any return of/worsening chest pain trouble breathing or any other symptom personally concerning to yourself.

## 2022-08-19 NOTE — ED Triage Notes (Signed)
Patient states chest pain that radiates into left arm, started about one hour ago

## 2022-10-12 NOTE — Progress Notes (Signed)
[facility] [address] [place], [place] [address]  Pulmonary Sleep Medicine   Office Visit Note  Patient Name: [name] [place] DOB: 08-23-1970 MRN [id number]    Chief Complaint: Obstructive Sleep Apnea visit  Brief History:  [name] is seen today for an initial consult for CPAP@ 16 cmH2O. The patient has a longstanding history of sleep apnea. Patient is not using PAP nightly.  The patient feels *** after sleeping with PAP.  The patient reports *** from PAP use. Reported sleepiness is  *** and the Epworth Sleepiness Score is *** out of 24. The patient *** take naps. The patient complains of the following: ***  The compliance download shows 21% compliance with an average use time of 3 hours 41 minutes. The AHI is 1.6.  The patient *** of limb movements disrupting sleep. The patient continues to require PAP therapy in order to eliminate sleep apnea.  ROS  General: (-) fever, (-) chills, (-) night sweat Nose and Sinuses: (-) nasal stuffiness or itchiness, (-) postnasal drip, (-) nosebleeds, (-) sinus trouble. Mouth and Throat: (-) sore throat, (-) hoarseness. Neck: (-) swollen glands, (-) enlarged thyroid, (-) neck pain. Respiratory: *** cough, *** shortness of breath, *** wheezing. Neurologic: *** numbness, *** tingling. Psychiatric: *** anxiety, *** depression   Current Medication: No outpatient encounter medications on file as of 10/13/2022.   No facility-administered encounter medications on file as of 10/13/2022.    Surgical History: *** The histories are not reviewed yet. Please review them in the "History" navigator section and refresh this SmartLink.  Medical History: No past medical history on file.  Family History: Non contributory to the present illness  Social History: Social History   Socioeconomic History   Marital status: Not on file    Spouse name: Not on file   Number of children: Not on file   Years of education: Not on file    Highest education level: Not on file  Occupational History   Not on file  Tobacco Use   Smoking status: Not on file   Smokeless tobacco: Not on file  Substance and Sexual Activity   Alcohol use: Not on file   Drug use: Not on file   Sexual activity: Not on file  Other Topics Concern   Not on file  Social History Narrative   Not on file   Social Determinants of Health   Financial Resource Strain: Not on file  Food Insecurity: Not on file  Transportation Needs: Not on file  Physical Activity: Not on file  Stress: Not on file  Social Connections: Not on file  Intimate Partner Violence: Not on file    Vital Signs: There were no vitals taken for this visit. There is no height or weight on file to calculate BMI.    Examination: General Appearance: The patient is well-developed, well-nourished, and in no distress. Neck Circumference: 38 cm Skin: Gross inspection of skin unremarkable. Head: normocephalic, no gross deformities. Eyes: no gross deformities noted. ENT: ears appear grossly normal Neurologic: Alert and oriented. No involuntary movements.  STOP BANG RISK ASSESSMENT S (snore) Have you been told that you snore?     YES   T (tired) Are you often tired, fatigued, or sleepy during the day?   YES  O (obstruction) Do you stop breathing, choke, or gasp during sleep? YES/NO   P (pressure) Do you have or are you being treated for high blood pressure? YES/NO   B (BMI) Is your body index  greater than 35 kg/m? YES/NO   A (age) Are you 28 years old or older? YES   N (neck) Do you have a neck circumference greater than 16 inches?   YES/NO   G (gender) Are you a female? NO   TOTAL STOP/BANG "YES" ANSWERS        A STOP-Bang score of 2 or less is considered low risk, and a score of 5 or more is high risk for having either moderate or severe OSA. For people who score 3 or 4, doctors may need to perform further assessment to determine how likely they are to have OSA.          EPWORTH SLEEPINESS SCALE:  Scale:  (0)= no chance of dozing; (1)= slight chance of dozing; (2)= moderate chance of dozing; (3)= high chance of dozing  Chance  Situtation    Sitting and reading: ***    Watching TV: ***    Sitting Inactive in public: ***    As a passenger in car: ***      Lying down to rest: ***    Sitting and talking: ***    Sitting quielty after lunch: ***    In a car, stopped in traffic: ***   TOTAL SCORE:   *** out of 24    SLEEP STUDIES:  PSG (05/2022) AHI 84/hr, REM AHI 113/hr, min SpO2 78% Titration (05/2022) CPAP@ 16 cmH2O   CPAP COMPLIANCE DATA:  Date Range: 07/31/2022-10/10/2022  Average Daily Use: 3 hours 41 minutes  Median Use: 4 hours   Compliance for > 4 Hours: 21%  AHI: 1.6 respiratory events per hour  Days Used: 30/72 days  Mask Leak: 73.2  95th Percentile Pressure: 16         LABS: No results found for this or any previous visit (from the past 2160 hour(s)).  Radiology: Patient was never admitted.  No results found.  No results found.    Assessment and Plan: There are no problems to display for this patient.     The patient *** tolerate PAP and reports *** benefit from PAP use. The patient was reminded how to *** and advised to ***. The patient was also counselled on ***. The compliance is ***. The AHI is ***.   ***  General Counseling: I have discussed the findings of the evaluation and examination with [name].  I have also discussed any further diagnostic evaluation thatmay be needed or ordered today. [name] verbalizes understanding of the findings of todays visit. We also reviewed [name] medications today and discussed drug interactions and side effects including but not limited excessive drowsiness and altered mental states. We also discussed that there is always a risk not just to [name] but also people around [name]. she has been encouraged to call the office with any questions or concerns that should  arise related to todays visit.  No orders of the defined types were placed in this encounter.       I have personally obtained a history, examined the patient, evaluated laboratory and imaging results, formulated the assessment and plan and placed orders.  Yevonne Pax, MD [facility] Diplomate ABMS Pulmonary Critical Care Medicine and Sleep Medicine

## 2022-10-13 ENCOUNTER — Ambulatory Visit (INDEPENDENT_AMBULATORY_CARE_PROVIDER_SITE_OTHER): Payer: 59 | Admitting: Internal Medicine

## 2022-10-13 VITALS — BP 126/85 | HR 83 | Resp 16 | Ht 61.0 in | Wt 189.0 lb

## 2022-10-13 DIAGNOSIS — I10 Essential (primary) hypertension: Secondary | ICD-10-CM

## 2022-10-13 DIAGNOSIS — E039 Hypothyroidism, unspecified: Secondary | ICD-10-CM

## 2022-10-13 DIAGNOSIS — G4733 Obstructive sleep apnea (adult) (pediatric): Secondary | ICD-10-CM

## 2022-10-13 DIAGNOSIS — Z7189 Other specified counseling: Secondary | ICD-10-CM | POA: Diagnosis not present

## 2022-10-13 DIAGNOSIS — J4550 Severe persistent asthma, uncomplicated: Secondary | ICD-10-CM

## 2022-10-13 DIAGNOSIS — E669 Obesity, unspecified: Secondary | ICD-10-CM

## 2022-10-13 NOTE — Patient Instructions (Signed)

## 2022-10-22 ENCOUNTER — Other Ambulatory Visit: Payer: Self-pay | Admitting: Gastroenterology

## 2022-10-22 DIAGNOSIS — R1013 Epigastric pain: Secondary | ICD-10-CM

## 2022-11-13 ENCOUNTER — Encounter
Admission: RE | Admit: 2022-11-13 | Discharge: 2022-11-13 | Disposition: A | Discharge status: Home / Self Care | Payer: 59 | Source: Ambulatory Visit | Attending: Gastroenterology | Admitting: Gastroenterology

## 2022-11-13 DIAGNOSIS — R1013 Epigastric pain: Secondary | ICD-10-CM | POA: Diagnosis present

## 2022-11-13 MED ORDER — TECHNETIUM TC 99M SULFUR COLLOID
2.0000 | Freq: Once | INTRAVENOUS | Status: AC | PRN
  Administered 2022-11-13: 2.22 via ORAL

## 2022-11-26 ENCOUNTER — Other Ambulatory Visit: Payer: Self-pay | Admitting: Orthopedic Surgery

## 2022-11-26 DIAGNOSIS — Z9889 Other specified postprocedural states: Secondary | ICD-10-CM

## 2022-11-26 DIAGNOSIS — M25541 Pain in joints of right hand: Secondary | ICD-10-CM

## 2022-11-26 DIAGNOSIS — M19031 Primary osteoarthritis, right wrist: Secondary | ICD-10-CM

## 2022-12-12 ENCOUNTER — Ambulatory Visit
Admission: RE | Admit: 2022-12-12 | Discharge: 2022-12-12 | Disposition: A | Discharge status: Home / Self Care | Payer: 59 | Source: Ambulatory Visit | Attending: Orthopedic Surgery | Admitting: Orthopedic Surgery

## 2022-12-12 DIAGNOSIS — M25541 Pain in joints of right hand: Secondary | ICD-10-CM

## 2022-12-12 DIAGNOSIS — Z9889 Other specified postprocedural states: Secondary | ICD-10-CM

## 2022-12-12 DIAGNOSIS — M19031 Primary osteoarthritis, right wrist: Secondary | ICD-10-CM

## 2023-02-19 NOTE — Progress Notes (Signed)
Sleep Medicine   Office Visit  Patient Name: [name] [place] DOB: Dec 16, 1970 MRN [id number]    Chief Complaint: OSA  Brief History:  [place] presents for a follow up visit for CPAP@ 16 cmH2O and to restart therapy. Patient has a longstanding history of sleep apnea. Sleep quality is poor. This is noted most nights. The patient's bed partner reports  snoring, gasping, choking and witnessed apneic pauses at night. The patient relates the following symptoms: excessive fatigue and inability to stay asleep are also present. The patient goes to sleep at 1000 pm and wakes up at 0630 am and will wake up several times in between with trouble returning to sleep. Sleep quality is worse when outside home environment.  Patient has noted no significant movement of her legs at night.  The patient  relates some nightmares behavior during the night.  The patient depression and anxiety as a history of psychiatric problems. The Epworth Sleepiness Score is 13 out of 24.  The patient relates  Cardiovascular risk factors include: HTN. Patient does also state some chest pressure in office and gets tingling with this. Went to ED for the same symptoms 1 week ago and states workup was reassuring. She sees cardiology in Feb and does have nitro if needed. States it is improving some with water and deep breaths and will sit and wait and see how she feels. She declines [place] calling EMS or anyone for her. Possible anxiety is contributing. Discussed going to ED if symptoms continue.  ROS  General: (-) fever, (-) chills, (-) night sweat Nose and Sinuses: (-) nasal stuffiness or itchiness, (-) postnasal drip, (-) nosebleeds, (-) sinus trouble. Mouth and Throat: (-) sore throat, (-) hoarseness. Neck: (-) swollen glands, (-) enlarged thyroid, (-) neck pain. Respiratory: - cough, - shortness of breath, - wheezing. Neurologic: - numbness, - tingling. Psychiatric: + anxiety, + depression Sleep behavior: -sleep paralysis  -hypnogogic hallucinations -dream enactment      -vivid dreams -cataplexy -night terrors -sleep walking   Current Medication: Outpatient Encounter Medications as of 02/23/2023  Medication Sig   acetaminophen (TYLENOL) 160 MG/5ML liquid Take 976 mg by mouth every 4 (four) hours as needed for fever.   albuterol (PROVENTIL HFA;VENTOLIN HFA) 108 (90 Base) MCG/ACT inhaler Inhale 2 puffs into the lungs every 8 (eight) hours as needed for wheezing or shortness of breath. Last used today, [id number], AM.   albuterol (PROVENTIL) (2.5 MG/3ML) 0.083% nebulizer solution Inhale 2.5 mg into the lungs every 6 (six) hours as needed for wheezing or shortness of breath. Last used today, [id number], AM.   benzonatate (TESSALON) 100 MG capsule Take 1 capsule (100 mg total) by mouth every 8 (eight) hours.   budesonide-formoterol (SYMBICORT) 160-4.5 MCG/ACT inhaler Inhale 2 puffs into the lungs 2 (two) times daily.   calcium carbonate (OSCAL) 1500 (600 Ca) MG TABS tablet Take 600 mg of elemental calcium by mouth daily with breakfast.   carvedilol (COREG) 25 MG tablet Take 25 mg by mouth 2 (two) times daily with a meal.   cholecalciferol (VITAMIN D3) 25 MCG (1000 UNIT) tablet Take 1,000 Units by mouth daily.   clonazePAM (KLONOPIN) 1 MG tablet Take 1 mg by mouth at bedtime.   Cyanocobalamin (VITAMIN B-12 PO) Take 1 tablet by mouth daily.   cyclobenzaprine (FLEXERIL) 10 MG tablet Take 10 mg by mouth 3 (three) times daily as needed for muscle spasms.   famotidine (PEPCID) 40 MG tablet Take 40 mg by mouth at bedtime.   FLUoxetine (PROZAC)  10 MG tablet Take 10 mg by mouth daily.   fluticasone (FLONASE) 50 MCG/ACT nasal spray Place 2 sprays into both nostrils daily.   guaiFENesin (MUCINEX) 600 MG 12 hr tablet Take 1 tablet (600 mg total) by mouth 2 (two) times daily.   hydrochlorothiazide (HYDRODIURIL) 25 MG tablet Take 25 mg by mouth in the morning.   hyoscyamine (LEVSIN SL) 0.125 MG SL tablet Place 0.125 mg under the tongue  every 4 (four) hours as needed.   ibuprofen (ADVIL) 600 MG tablet Take 600 mg by mouth every 6 (six) hours as needed.   levalbuterol (XOPENEX HFA) 45 MCG/ACT inhaler Inhale 2 puffs into the lungs every 4 (four) hours as needed for wheezing.   levothyroxine (SYNTHROID) 112 MCG tablet Take 112 mcg by mouth daily before breakfast.   loratadine (CLARITIN) 10 MG tablet Take 10 mg by mouth in the morning.   metFORMIN (GLUCOPHAGE-XR) 750 MG 24 hr tablet Take 750 mg by mouth 3 (three) times a week.   MM MELATONIN PO Take by mouth.   montelukast (SINGULAIR) 10 MG tablet Take 10 mg by mouth at bedtime.   Multiple Vitamins-Minerals (BARIATRIC MULTIVITAMINS/IRON PO) Take 1 tablet by mouth daily.   nitroGLYCERIN (NITROSTAT) 0.4 MG SL tablet Place 0.4 mg under the tongue every 5 (five) minutes x 3 doses as needed for chest pain.   nystatin ointment (MYCOSTATIN) Apply 1 application topically 2 (two) times daily.   omeprazole (PRILOSEC) 40 MG capsule Take 40 mg by mouth 2 (two) times daily.   Potassium Gluconate 550 MG TABS Take 550 mg by mouth daily with lunch.   pregabalin (LYRICA) 25 MG capsule Take 2 capsules (50 mg total) by mouth 2 (two) times daily.   promethazine-dextromethorphan (PROMETHAZINE-DM) 6.25-15 MG/5ML syrup Take 5 mLs by mouth 4 (four) times daily as needed for cough.   rosuvastatin (CRESTOR) 10 MG tablet Take 10 mg by mouth at bedtime.   TRELEGY ELLIPTA 200-62.5-25 MCG/ACT AEPB Inhale into the lungs.   [DISCONTINUED] gabapentin (NEURONTIN) 400 MG capsule Take 400 mg by mouth every morning.   No facility-administered encounter medications on file as of 02/23/2023.    Surgical History: Past Surgical History:  Procedure Laterality Date   CARPAL TUNNEL RELEASE     bilateral   CARPAL TUNNEL RELEASE Right 10/20/2017   Procedure: CARPAL TUNNEL RELEASE;  Surgeon: Erin Sons, MD;  Location: [facility] ORS;  Service: Orthopedics;  Laterality: Right;   CESAREAN SECTION     COLONOSCOPY WITH  PROPOFOL N/A 02/21/2021   Procedure: COLONOSCOPY WITH PROPOFOL;  Surgeon: Jaynie Collins, DO;  Location: [facility] ENDOSCOPY;  Service: Gastroenterology;  Laterality: N/A;  SPANISH INTERPRETER   EYE SURGERY     simple surgery, not cataract   FLAT FOOT CORRECTION Left 07/19/2020   Procedure: Evans/MCDO-L;  Surgeon: Gwyneth Revels, DPM;  Location: [facility] ORS;  Service: Podiatry;  Laterality: Left;   FOOT SURGERY Left    screw in toe   GASTROC RECESSION EXTREMITY Left 07/19/2020   Procedure: GASTROC RECESSION EXTREMITY;  Surgeon: Gwyneth Revels, DPM;  Location: [facility] ORS;  Service: Podiatry;  Laterality: Left;   LAPAROSCOPIC GASTRIC SLEEVE RESECTION  10/2019   TENDON TRANSFER Left 07/19/2020   Procedure: TENDON TRANSFER FDL transfer; Deep-L;  Surgeon: Gwyneth Revels, DPM;  Location: [facility] ORS;  Service: Podiatry;  Laterality: Left;    Medical History: Past Medical History:  Diagnosis Date   Anxiety    Asthma    Bronchitis, chronic (HCC)    Carpal tunnel syndrome  COPD (chronic obstructive pulmonary disease) (HCC)    Depression    Disc degeneration, lumbosacral    GERD (gastroesophageal reflux disease)    Hypertension    Hypothyroidism    Migraine    Osteoporosis    Pre-diabetes    Sleep apnea    uses cpap   Thyroid disease     Family History: Non contributory to the present illness  Social History: Social History   Socioeconomic History   Marital status: Divorced    Spouse name: Not on file   Number of children: Not on file   Years of education: Not on file   Highest education level: Not on file  Occupational History   Not on file  Tobacco Use   Smoking status: Never   Smokeless tobacco: Never  Vaping Use   Vaping status: Never Used  Substance and Sexual Activity   Alcohol use: Yes    Comment: rarely   Drug use: No   Sexual activity: Yes    Partners: Male    Birth control/protection: None  Other Topics Concern   Not on file  Social History Narrative   Patient  lives with her niece. Her mom will be available to help out after surgery.   Patient has about 9 stairs in her home.    Feels safe in her home.    Social Drivers of Corporate investment banker Strain: High Risk (09/08/2022)   Received from [facility]   Overall Financial Resource Strain (CARDIA)    Difficulty of Paying Living Expenses: Hard  Food Insecurity: Food Insecurity Present (09/08/2022)   Received from [facility]   Hunger Vital Sign    Worried About Running Out of Food in the Last Year: Often true    Ran Out of Food in the Last Year: Sometimes true  Transportation Needs: No Transportation Needs (09/08/2022)   Received from [facility] - Transportation    In the past 12 months, has lack of transportation kept you from medical appointments or from getting medications?: No    Lack of Transportation (Non-Medical): No  Physical Activity: Sufficiently Active (09/08/2022)   Received [facility]   Exercise Vital Sign    Days of Exercise per Week: 4 days    Minutes of Exercise per Session: 60 min  Stress: Stress Concern Present (09/08/2022)   Received from [facility] - Occupational Stress Questionnaire    Feeling of Stress : Rather much  Social Connections: Moderately Isolated (09/08/2022)   Received from [facility]   Social Connection and Isolation Panel [NHANES]    Frequency of Communication with Friends and Family: Twice a week    Frequency of Social Gatherings with Friends and Family: Once a week    Attends Religious Services: More than 4 times per year    Active Member of [employer] or Organizations: No    Attends Banker Meetings: Never    Marital Status: Divorced  Catering manager Violence: Not on file    Vital Signs: Blood pressure (!) 145/93, pulse 71, resp. rate 16, height 5\' 2"  (1.575 m), weight 194 lb  (88 kg), last menstrual period 05/17/2018, SpO2 97%. Body mass index is 35.48 kg/m.   Examination: General Appearance: The patient is well-developed, well-nourished, and in no distress. Neck Circumference: 38 cm Skin: Gross inspection of skin unremarkable. Head: normocephalic, no gross deformities. Eyes:  no gross deformities noted. ENT: ears appear grossly normal Neurologic: Alert and oriented. No involuntary movements.    STOP BANG RISK ASSESSMENT S (snore) Have you been told that you snore?     YES   T (tired) Are you often tired, fatigued, or sleepy during the day?   YES  O (obstruction) Do you stop breathing, choke, or gasp during sleep? YES   P (pressure) Do you have or are you being treated for high blood pressure? YES   B (BMI) Is your body index greater than 35 kg/m? NO   A (age) Are you 24 years old or older? YES   N (neck) Do you have a neck circumference greater than 16 inches?   YES   G (gender) Are you a female? NO   TOTAL STOP/BANG "YES" ANSWERS 6                                                               A STOP-Bang score of 2 or less is considered low risk, and a score of 5 or more is high risk for having either moderate or severe OSA. For people who score 3 or 4, doctors may need to perform further assessment to determine how likely they are to have OSA.         EPWORTH SLEEPINESS SCALE:  Scale:  (0)= no chance of dozing; (1)= slight chance of dozing; (2)= moderate chance of dozing; (3)= high chance of dozing  Chance  Situtation    Sitting and reading: 3    Watching TV: 2    Sitting Inactive in public: 1    As a passenger in car: 3      Lying down to rest: 3    Sitting and talking: 0    Sitting quielty after lunch: 1    In a car, stopped in traffic: 0   TOTAL SCORE:   13 out of 24    SLEEP STUDIES:  PSG (05/2022) AHI 84/hr, REM AHI 113/hr, min SpO2 78% Titration (05/2022) CPAP@ 16 cmH2O   LABS: No results found for this or  any previous visit (from the past 2160 hours).  Radiology: MR WRIST RIGHT WO CONTRAST Result Date: 01/04/2023 CLINICAL DATA:  Right wrist pain. Prior carpal tunnel release in 2010 and then 2019. Chronic worsening pain for 1 month. EXAM: MR OF THE RIGHT WRIST WITHOUT CONTRAST TECHNIQUE: Multiplanar, multisequence MR imaging of the right wrist was performed. No intravenous contrast was administered. COMPARISON:  None Available. FINDINGS: Ligaments: Extrinsic ligaments are intact. Lunotriquetral ligament is intact. Mild degeneration of the scapholunate ligament which is otherwise intact Triangular fibrocartilage: Intact Tendons: Moderate tendinosis of the extensor carpi ulnaris tendon with mild tenosynovitis at the level of the distal ulna. Moderate tendinosis of the extensor pollicis longus tendon. Remainder of the flexor and extensor compartment tendons are intact. Mild tenosynovitis of the second and third flexor digitorum tendon sheaths at the level of the metacarpal neck. Carpal tunnel/median nerve: Prior carpal tunnel release with postsurgical defect in the flexor retinaculum. Normal carpal tunnel without a mass. Median nerve demonstrates normal signal and caliber. Guyon's canal: Normal Guyon's canal. Normal ulnar nerve. Joint/cartilage: Mild osteoarthritis of the scaphotrapeziotrapezoid joint with mild subchondral reactive marrow edema in the trapezoid and distal scaphoid. Intraosseous cyst  in the proximal capitate. Mild subchondral marrow edema in the lunate at the scapholunate joint. Moderate osteoarthritis of the pisotriquetral joint with subchondral marrow edema in the pisiform. Mild osteoarthritis of the first MCP joint. Small distal radioulnar joint effusion. Bones/carpal alignment: No fracture, avascular necrosis, or osseous lesion. Normal alignment. Other: Muscles are normal. No fluid collection, hematoma, or soft tissue mass. IMPRESSION: 1. Moderate tendinosis of the extensor carpi ulnaris tendon  with mild tenosynovitis at the level of the distal ulna. 2. Moderate tendinosis of the extensor pollicis longus tendon. 3. Mild tenosynovitis of the second and third flexor digitorum tendon sheaths at the level of the metacarpal neck. 4. Mild osteoarthritis of the scaphotrapeziotrapezoid joint with mild subchondral reactive marrow edema in the trapezoid and distal scaphoid. 5. Mild osteoarthritis of the first MCP joint. 6. Moderate osteoarthritis of the pisotriquetral joint with subchondral marrow edema in the pisiform. 7. Prior carpal tunnel release with postsurgical defect in the flexor retinaculum. Electronically Signed   By: Elige Ko M.D.   On: 01/04/2023 13:22    No results found.  No results found.    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 Pt had difficulty tolerating high CPAP pressures and adjusting to mask. Last titration indicated possible need for bipap and pt may tolerate this better. Will move forward with titration for possible bipap. Patient continues to require PAP to treat their apnea and is medically necessary.   1. OSA (obstructive sleep apnea) (Primary) Will order titration for restart  2. Essential hypertension Continue current medication and f/u with PCP. Some Chest pressure in office, reports hx of similar and was advised to sit and rest and that we will call EMS if not improving. Pt declines EMS, but advised to go to ED if not improving.  3. Hypothyroidism  (acquired) Continue current medication and f/u with PCP.  4. Severe persistent asthma without complication Continue inhalers as prescribed.  5. Obesity (BMI 30-39.9) Obesity Counseling: Had a lengthy discussion regarding patients BMI and weight issues. Patient was instructed on portion control as well as increased activity. Also discussed caloric restrictions with trying to maintain intake less than 2000 Kcal. Discussions were made in accordance with the 5As of weight management. Simple actions such as not eating late and if able to, taking a walk is suggested.  6. Anxiety Followed by psych    General Counseling: I have discussed the findings of the evaluation and examination with Tuyen.  I have also discussed any further diagnostic evaluation thatmay be needed or ordered today. Nayab verbalizes understanding of the findings of todays visit. We also reviewed her medications today and discussed drug interactions and side effects including but not limited excessive drowsiness and altered mental states. We also discussed that there is always a risk not just to her but also people around her. she has been encouraged to call the office with any questions or concerns that should arise related to todays visit.  No orders of the defined types were placed in this encounter.  I have personally obtained a history, evaluated the patient, evaluated pertinent data, formulated the assessment and plan and placed orders.  This patient was seen by Lynn Ito, PA-C in collaboration with Dr. Freda Munro as a part of collaborative care agreement.    Yevonne Pax, MD [facility] Diplomate ABMS Pulmonary and Critical Care Medicine Sleep medicine

## 2023-02-23 ENCOUNTER — Ambulatory Visit (INDEPENDENT_AMBULATORY_CARE_PROVIDER_SITE_OTHER): Payer: 59 | Admitting: Internal Medicine

## 2023-02-23 VITALS — BP 145/93 | HR 71 | Resp 16 | Ht 62.0 in | Wt 194.0 lb

## 2023-02-23 DIAGNOSIS — F419 Anxiety disorder, unspecified: Secondary | ICD-10-CM

## 2023-02-23 DIAGNOSIS — J4550 Severe persistent asthma, uncomplicated: Secondary | ICD-10-CM

## 2023-02-23 DIAGNOSIS — G4733 Obstructive sleep apnea (adult) (pediatric): Secondary | ICD-10-CM | POA: Diagnosis not present

## 2023-02-23 DIAGNOSIS — I10 Essential (primary) hypertension: Secondary | ICD-10-CM

## 2023-02-23 DIAGNOSIS — E039 Hypothyroidism, unspecified: Secondary | ICD-10-CM

## 2023-02-23 DIAGNOSIS — E669 Obesity, unspecified: Secondary | ICD-10-CM

## 2023-02-23 NOTE — Patient Instructions (Signed)
Living With Sleep Apnea Sleep apnea is a condition that affects your breathing while you're sleeping. Your tongue or the tissue in your throat may block the flow of air while you sleep. You may have shallow breathing or stop breathing for short periods of time. The breaks in breathing interrupt the deep sleep that you need to feel rested. Even if you don't wake up from the gaps in breathing, you may feel tired during the day. People with sleep apnea may snore loudly. You may have a headache in the morning and feel anxious or depressed. How can sleep apnea affect me? Sleep apnea increases your chances of being very tired during the day. This is called daytime fatigue. Sleep apnea can also increase your risk of: Heart attack. Stroke. Obesity. Type 2 diabetes. Heart failure. Irregular heartbeat. High blood pressure. If you are very tired during the day, you may be more likely to: Not do well in school or at work. Fall asleep while driving. Have trouble paying attention. Develop depression or anxiety. Have problems having sex. This is called sexual dysfunction. What actions can I take to manage sleep apnea? Sleep apnea treatment  If you were given a device to open your airway while you sleep, use it only as told by your health care provider. You may be given: An oral appliance. This is a mouthpiece that shifts your lower jaw forward. A continuous positive airway pressure (CPAP) device. This blows air through a mask. A nasal expiratory positive airway pressure (EPAP) device. This has valves that you put into each nostril. A bi-level positive airway pressure (BIPAP) device. This blows air through a mask when you breathe in and breathe out. You may need surgery if other treatments don't work for you. Sleep habits Go to sleep and wake up at the same time every day. This helps set your internal clock for sleeping. If you stay up later than usual on weekends, try to get up in the morning within 2  hours of the time you usually wake up. Try to get at least 7-9 hours of sleep each night. Stop using a computer, tablet, and mobile phone a few hours before bedtime. Do not take long naps during the day. If you nap, limit it to 30 minutes. Have a relaxing bedtime routine. Reading or listening to music may relax you and help you sleep. Use your bedroom only for sleep. Keep your television and computer out of your bedroom. Keep your bedroom cool, dark, and quiet. Use a supportive mattress and pillows. Follow your provider's instructions for other changes to sleep habits. Nutrition Do not eat big meals in the evening. Do not have caffeine in the later part of the day. The effects of caffeine can last for more than 5 hours. Follow your provider's instructions for any changes to what you eat and drink. Lifestyle Do not drink alcohol before bedtime. Alcohol can cause you to fall asleep at first, but then it can cause you to wake up in the middle of the night and have trouble getting back to sleep. Do not smoke, vape, or use nicotine or tobacco. Medicines Take over-the-counter and prescription medicines only as told by your provider. Do not use over-the-counter sleep medicine. You may become dependent on this medicine, and it can make sleep apnea worse. Do not take medicines, such as sedatives and narcotics, unless told to by your provider. Activity Exercise on most days, but avoid exercising in the evening. Exercising near bedtime can interfere with sleeping.  If possible, spend time outside every day. Natural light helps with your internal clock. General information Lose weight if you need to. Stay at a healthy weight. If you are having surgery, make sure to tell your provider that you have sleep apnea. You may need to bring your device with you. Keep all follow-up visits. Your provider will want to check on your condition. Where to find more information National Heart, Lung, and Blood  Institute: [employer] This information is not intended to replace advice given to you by your health care provider. Make sure you discuss any questions you have with your health care provider. Document Revised: 05/13/2022 Document Reviewed: 05/13/2022 Elsevier Patient Education  2024 [employer].

## 2023-06-01 ENCOUNTER — Ambulatory Visit
Admission: EM | Admit: 2023-06-01 | Discharge: 2023-06-01 | Disposition: A | Discharge status: Home / Self Care | Attending: Emergency Medicine | Admitting: Emergency Medicine

## 2023-06-01 ENCOUNTER — Encounter: Payer: Self-pay | Admitting: Emergency Medicine

## 2023-06-01 DIAGNOSIS — J069 Acute upper respiratory infection, unspecified: Secondary | ICD-10-CM | POA: Diagnosis not present

## 2023-06-01 DIAGNOSIS — J4531 Mild persistent asthma with (acute) exacerbation: Secondary | ICD-10-CM | POA: Insufficient documentation

## 2023-06-01 LAB — RESP PANEL BY RT-PCR (FLU A&B, COVID) ARPGX2
Influenza A by PCR: NEGATIVE
Influenza B by PCR: NEGATIVE
SARS Coronavirus 2 by RT PCR: NEGATIVE

## 2023-06-01 MED ORDER — PROMETHAZINE-DM 6.25-15 MG/5ML PO SYRP: 5.0000 mL | ORAL_SOLUTION | Freq: Four times a day (QID) | ORAL | 0 refills | Status: DC | PRN

## 2023-06-01 MED ORDER — PREDNISONE 20 MG PO TABS: 60.0000 mg | ORAL_TABLET | Freq: Every day | ORAL | 0 refills | Status: AC

## 2023-06-01 MED ORDER — IPRATROPIUM BROMIDE 0.06 % NA SOLN: 2.0000 | Freq: Four times a day (QID) | NASAL | 12 refills | Status: AC

## 2023-06-01 MED ORDER — BENZONATATE 100 MG PO CAPS: 200.0000 mg | ORAL_CAPSULE | Freq: Three times a day (TID) | ORAL | 0 refills | Status: AC

## 2023-06-01 NOTE — ED Triage Notes (Signed)
 Pt presents with a cough, fever, runny nose, bodyaches x 3 days. Pt has taken OTC cold medication with no relief.

## 2023-06-01 NOTE — Discharge Instructions (Addendum)
 Your viral testing was negative for COVID or influenza.  I do believe you have a viral respiratory illness that is also exacerbating your asthma.  Continue to use your albuterol  inhaler and nebulizer, 1 to 2 puffs or 1 treatment every 4-6 hours as needed for shortness of breath or wheezing.  Starting tomorrow begin the prednisone  60 mg daily.  This will decrease pulmonary inflammation and should help with your breathing.  Use the Atrovent  nasal spray, 2 squirts in each nostril every 6 hours, as needed for runny nose and postnasal drip.  Use the Tessalon  Perles every 8 hours during the day.  Take them with a small sip of water.  They may give you some numbness to the base of your tongue or a metallic taste in your mouth, this is normal.  Use the Promethazine  DM cough syrup at bedtime for cough and congestion.  It will make you drowsy so do not take it during the day.  Return for reevaluation or see your primary care provider for any new or worsening symptoms.

## 2023-06-01 NOTE — ED Provider Notes (Signed)
 [facility]    CSN: [id number] Arrival date & time: 06/01/23  1239      History   Chief Complaint Chief Complaint  Patient presents with   Cough   Emesis   Fever   Nasal Congestion    HPI [name] is a 53 y.o. female.   HPI  53 year old female with past medical history significant for hypothyroidism, essential hypertension, depression, anxiety, and chronic pain syndrome presents for evaluation of respiratory symptoms that began 3 days ago.  The patient reports that this morning she did have a fever.  Remainder of her symptoms include runny nose and nasal congestion for clear nasal discharge, body aches, nonproductive cough, shortness breath, and wheezing.  The patient reports she also has a history of asthma and has been using her albuterol  inhaler which does help with the shortness of breath.  She has tried over-the-counter cough preparations without any improvement of her cough.  Her cough is what is most bothersome.  Past Medical History:  Diagnosis Date   Anxiety    Asthma    Bronchitis, chronic (HCC)    Carpal tunnel syndrome    COPD (chronic obstructive pulmonary disease) (HCC)    Depression    Disc degeneration, lumbosacral    GERD (gastroesophageal reflux disease)    Hypertension    Hypothyroidism    Migraine    Osteoporosis    Pre-diabetes    Sleep apnea    uses cpap   Thyroid  disease     Patient Active Problem List   Diagnosis Date Noted   Intermittent chest pain 07/28/2022   Anxiety 07/28/2022   Chronic radicular lumbar pain 11/25/2021   Cervical radicular pain 11/25/2021   Chronic pain syndrome 11/25/2021   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   Severe persistent asthma without complication 02/23/2019   Essential hypertension 06/17/2017   Hypothyroidism (acquired) 06/17/2017   Major depression, 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    Past Surgical History:  Procedure Laterality Date   CARPAL TUNNEL RELEASE      bilateral   CARPAL TUNNEL RELEASE Right 10/20/2017   Procedure: CARPAL TUNNEL RELEASE;  Surgeon: Josephus Nida, MD;  Location: [facility];  Service: Orthopedics;  Laterality: Right;   CESAREAN SECTION     COLONOSCOPY WITH PROPOFOL  N/A 02/21/2021   Procedure: COLONOSCOPY WITH PROPOFOL ;  Surgeon: Quintin Buckle, DO;  Location: [facility] END[facility]COPY;  Service: Gastroenterology;  Laterality: N/A;  SPANISH INTERPRETER   EYE SURGERY     simple surgery, not cataract   FLAT FOOT CORRECTION Left 07/19/2020   Procedure: Evans/MCDO-L;  Surgeon: Anell Baptist, DPM;  Location: [facility];  Service: Podiatry;  Laterality: Left;   FOOT SURGERY Left    screw in toe   GASTROC RECESSION EXTREMITY Left 07/19/2020   Procedure: GASTROC RECESSION EXTREMITY;  Surgeon: Anell Baptist, DPM;  Location: [facility];  Service: Podiatry;  Laterality: Left;   LAPAROSCOPIC GASTRIC SLEEVE RESECTION  10/2019   TENDON TRANSFER Left 07/19/2020   Procedure: TENDON TRANSFER FDL transfer; Deep-L;  Surgeon: Anell Baptist, DPM;  Location: [facility];  Service: Podiatry;  Laterality: Left;    OB History   No obstetric history on file.      Home Medications    Prior to Admission medications   Medication Sig Start Date End Date Taking? Authorizing Provider  benzonatate  (TESSALON ) 100 MG capsule Take 2 capsules (200 mg total) by mouth every 8 (eight) hours. 06/01/23  Yes Kent Pear, NP  ipratropium (ATROVENT ) 0.06 % nasal spray Place  2 sprays into both nostrils 4 (four) times daily. 06/01/23  Yes Kent Pear, NP  predniSONE  (DELTASONE ) 20 MG tablet Take 3 tablets (60 mg total) by mouth daily with breakfast for 5 days. 3 tablets daily for 5 days. 06/01/23 06/06/23 Yes Kent Pear, NP  promethazine -dextromethorphan (PROMETHAZINE -DM) 6.25-15 MG/5ML syrup Take 5 mLs by mouth 4 (four) times daily as needed. 06/01/23  Yes Kent Pear, NP  acetaminophen  (TYLENOL ) 160 MG/5ML liquid Take 976 mg by mouth every 4 (four) hours as needed for fever.     [provider]  albuterol  (PROVENTIL  HFA;VENTOLIN  HFA) 108 (90 Base) MCG/ACT inhaler Inhale 2 puffs into the lungs every 8 (eight) hours as needed for wheezing or shortness of breath. Last used today, [id number], AM.    [provider]  albuterol  (PROVENTIL ) (2.5 MG/3ML) 0.083% nebulizer solution Inhale 2.5 mg into the lungs every 6 (six) hours as needed for wheezing or shortness of breath. Last used today, [id number], AM. 05/17/20   [provider]  budesonide-formoterol (SYMBICORT) 160-4.5 MCG/ACT inhaler Inhale 2 puffs into the lungs 2 (two) times daily.    [provider]  calcium carbonate (OSCAL) 1500 (600 Ca) MG TABS tablet Take 600 mg of elemental calcium by mouth daily with breakfast.    [provider]  carvedilol  (COREG ) 25 MG tablet Take 25 mg by mouth 2 (two) times daily with a meal.    [provider]  cholecalciferol (VITAMIN D3) 25 MCG (1000 UNIT) tablet Take 1,000 Units by mouth daily.    [provider]  clonazePAM  (KLONOPIN ) 1 MG tablet Take 1 mg by mouth at bedtime. 07/15/17   [provider]  Cyanocobalamin (VITAMIN B-12 PO) Take 1 tablet by mouth daily.    [provider]  cyclobenzaprine (FLEXERIL) 10 MG tablet Take 10 mg by mouth 3 (three) times daily as needed for muscle spasms.    [provider]  famotidine  (PEPCID ) 40 MG tablet Take 40 mg by mouth at bedtime.    [provider]  FLUoxetine (PROZAC) 10 MG tablet Take 10 mg by mouth daily.    [provider]  fluticasone  (FLONASE ) 50 MCG/ACT nasal spray Place 2 sprays into both nostrils daily.    [provider]  guaiFENesin  (MUCINEX ) 600 MG 12 hr tablet Take 1 tablet (600 mg total) by mouth 2 (two) times daily. 01/20/21   White, Maybelle Spatz, NP  hydrochlorothiazide  (HYDRODIURIL ) 25 MG tablet Take 25 mg by mouth in the morning.    [provider]  hyoscyamine (LEVSIN SL) 0.125 MG SL tablet Place 0.125 mg  under the tongue every 4 (four) hours as needed.    [provider]  ibuprofen  (ADVIL ) 600 MG tablet Take 600 mg by mouth every 6 (six) hours as needed.    [provider]  levalbuterol Bernhard Brine HFA) 45 MCG/ACT inhaler Inhale 2 puffs into the lungs every 4 (four) hours as needed for wheezing.    [provider]  levothyroxine  (SYNTHROID ) 112 MCG tablet Take 112 mcg by mouth daily before breakfast. 03/02/18   [provider]  loratadine  (CLARITIN ) 10 MG tablet Take 10 mg by mouth in the morning.    [provider]  metFORMIN (GLUCOPHAGE-XR) 750 MG 24 hr tablet Take 750 mg by mouth 3 (three) times a week. 06/04/20   [provider]  MM MELATONIN PO Take by mouth.    [provider]  montelukast  (SINGULAIR ) 10 MG tablet Take 10 mg by mouth at bedtime. 03/16/18  [provider]  Multiple Vitamins-Minerals (BARIATRIC MULTIVITAMINS/IRON PO) Take 1 tablet by mouth daily.    [provider]  nitroGLYCERIN (NITROSTAT) 0.4 MG SL tablet Place 0.4 mg under the tongue every 5 (five) minutes x 3 doses as needed for chest pain.    [provider]  nystatin ointment (MYCOSTATIN) Apply 1 application topically 2 (two) times daily.    [provider]  omeprazole (PRILOSEC) 40 MG capsule Take 40 mg by mouth 2 (two) times daily.    [provider]  Potassium Gluconate 550 MG TABS Take 550 mg by mouth daily with lunch.    [provider]  pregabalin  (LYRICA ) 25 MG capsule Take 2 capsules (50 mg total) by mouth 2 (two) times daily. 11/25/21   Lateef, Bilal, MD  rosuvastatin (CRESTOR) 10 MG tablet Take 10 mg by mouth at bedtime.    [provider]  Gaylan Kaufman 200-62.5-25 MCG/ACT AEPB Inhale into the lungs. 07/23/22   [provider]  gabapentin (NEURONTIN) 400 MG capsule Take 400 mg by mouth every morning.  05/25/19  [provider]    Family History Family History  Problem  Relation Age of Onset   Hypertension Mother    Depression Mother    Heart disease Mother    Thyroid  disease Mother    Heart disease Father    Heart attack Father    Cancer Paternal Aunt    Cancer Paternal Uncle    Breast cancer Neg Hx     Social History Social History   Tobacco Use   Smoking status: Never   Smokeless tobacco: Never  Vaping Use   Vaping status: Never Used  Substance Use Topics   Alcohol use: Yes    Comment: rarely   Drug use: No     Allergies   Morphine and codeine, Percocet [oxycodone -acetaminophen ], Rifaximin, and Toradol  [ketorolac  tromethamine ]   Review of Systems Review of Systems  Constitutional:  Positive for fever.  HENT:  Positive for congestion and rhinorrhea. Negative for ear pain and sore throat.   Respiratory:  Positive for cough, shortness of breath and wheezing.      Physical Exam Triage Vital Signs ED Triage Vitals  Encounter Vitals Group     BP      Systolic BP Percentile      Diastolic BP Percentile      Pulse      Resp      Temp      Temp src      SpO2      Weight      Height      Head Circumference      Peak Flow      Pain Score      Pain Loc      Pain Education      Exclude from Growth Chart    No data found.  Updated Vital Signs BP 121/88 (BP Location: Left Arm)   Pulse 81   Temp 98.4 F (36.9 C) (Oral)   Resp 18   LMP 05/17/2018   SpO2 96%   Visual Acuity Right Eye Distance:   Left Eye Distance:   Bilateral Distance:    Right Eye Near:   Left Eye Near:    Bilateral Near:     Physical Exam Vitals and nursing note reviewed.  Constitutional:      Appearance: Normal appearance. She is not ill-appearing.  HENT:     Head: Normocephalic and atraumatic.     Right  Ear: Tympanic membrane, ear canal and external ear normal. There is no impacted cerumen.     Left Ear: Tympanic membrane, ear canal and external ear normal. There is no impacted cerumen.     Nose: Congestion and rhinorrhea present.      Comments: Nasal mucosa is edematous and erythematous with clear discharge in both nares.    Mouth/Throat:     Mouth: Mucous membranes are moist.     Pharynx: Oropharynx is clear. No oropharyngeal exudate or posterior oropharyngeal erythema.  Cardiovascular:     Rate and Rhythm: Normal rate and regular rhythm.     Pulses: Normal pulses.     Heart sounds: Normal heart sounds. No murmur heard.    No friction rub. No gallop.  Pulmonary:     Effort: Pulmonary effort is normal.     Breath sounds: Normal breath sounds. No wheezing, rhonchi or rales.  Musculoskeletal:     Cervical back: Normal range of motion and neck supple. No tenderness.  Lymphadenopathy:     Cervical: No cervical adenopathy.  Skin:    General: Skin is warm and dry.     Capillary Refill: Capillary refill takes less than 2 seconds.     Findings: No rash.  Neurological:     General: No focal deficit present.     Mental Status: She is alert and oriented to person, place, and time.      UC Treatments / Results  Labs (all labs ordered are listed, but only abnormal results are displayed) Labs Reviewed  RESP PANEL BY RT-PCR (FLU A&B, COVID) [id number]    EKG   Radiology No results found.  Procedures Procedures (including critical care time)  Medications Ordered in UC Medications - No data to display  Initial Impression / Assessment and Plan / UC Course  I have reviewed the triage vital signs and the nursing notes.  Pertinent labs & imaging results that were available during my care of the patient were reviewed by me and considered in my medical decision making (see chart for details).   Patient is a pleasant, though tearful, 53 year old female presenting for evaluation of 3 days with the respiratory symptoms as outlined HPI above.  She had not been running a fever until this morning.  She took Tylenol  and the fever resolved.  She is currently afebrile with an oral temp of 98.4.  Her most pressing symptom is her  cough which she describes as unrelenting.  It is nonproductive and does have associated shortness of breath and wheezing.  Patient has a history of mild persistent asthma and has been using her albuterol  inhaler.  Differential diagnose include COVID, influenza, viral respiratory illness.  I will order a COVID and flu PCR.  Respiratory panel is negative for COVID and influenza.  I will discharge patient home with a diagnosis of viral URI with a cough and mild persistent asthma with a 5-day burst dose of prednisone  to help with her shortness of breath and wheezing.  She should continue to use her albuterol  inhaler every 4-6 hours.  I will also prescribe Atrovent  nasal spray to help with nasal congestion, Tessalon  Perles, and Promethazine  DM cough syrup.   Final Clinical Impressions(s) / UC Diagnoses   Final diagnoses:  Viral URI with cough  Mild persistent asthma with (acute) exacerbation     Discharge Instructions      Your viral testing was negative for COVID or influenza.  I do believe you have a viral respiratory illness that is also  exacerbating your asthma.  Continue to use your albuterol  inhaler and nebulizer, 1 to 2 puffs or 1 treatment every 4-6 hours as needed for shortness of breath or wheezing.  Starting tomorrow begin the prednisone  60 mg daily.  This will decrease pulmonary inflammation and should help with your breathing.  Use the Atrovent  nasal spray, 2 squirts in each nostril every 6 hours, as needed for runny nose and postnasal drip.  Use the Tessalon  Perles every 8 hours during the day.  Take them with a small sip of water.  They may give you some numbness to the base of your tongue or a metallic taste in your mouth, this is normal.  Use the Promethazine  DM cough syrup at bedtime for cough and congestion.  It will make you drowsy so do not take it during the day.  Return for reevaluation or see your primary care provider for any new or worsening symptoms.      ED  Prescriptions     Medication Sig Dispense Auth. Provider   benzonatate  (TESSALON ) 100 MG capsule Take 2 capsules (200 mg total) by mouth every 8 (eight) hours. 21 capsule Kent Pear, NP   ipratropium (ATROVENT ) 0.06 % nasal spray Place 2 sprays into both nostrils 4 (four) times daily. 15 mL Kent Pear, NP   promethazine -dextromethorphan (PROMETHAZINE -DM) 6.25-15 MG/5ML syrup Take 5 mLs by mouth 4 (four) times daily as needed. 118 mL Kent Pear, NP   predniSONE  (DELTASONE ) 20 MG tablet Take 3 tablets (60 mg total) by mouth daily with breakfast for 5 days. 3 tablets daily for 5 days. 15 tablet Kent Pear, NP      PDMP not reviewed this encounter.   Kent Pear, NP 06/01/23 [phone]

## 2023-07-14 ENCOUNTER — Other Ambulatory Visit: Payer: Self-pay | Admitting: Gastroenterology

## 2023-07-14 ENCOUNTER — Encounter: Payer: Self-pay | Admitting: Gastroenterology

## 2023-07-14 DIAGNOSIS — R1013 Epigastric pain: Secondary | ICD-10-CM

## 2023-07-14 DIAGNOSIS — R140 Abdominal distension (gaseous): Secondary | ICD-10-CM

## 2023-07-16 NOTE — Progress Notes (Signed)
 [facility] [address] [place], [place] [address]  Pulmonary Sleep Medicine   Office Visit Note  Patient Name: [name] [place] DOB: 1970-08-15 MRN [id number]    Chief Complaint: Obstructive Sleep Apnea visit  Brief History:  [name] is seen today for a follow up visit for BiPAP@ 15/13 cmH2O. The patient has a longstanding history of sleep apnea. Patient is not using PAP nightly.  The patient feels rested after sleeping with PAP.  The patient reports benefiting from PAP use. Reported sleepiness is  improved and the Epworth Sleepiness Score is 13 out of 24. The patient does not take naps. The patient complains of the following: pt is struggling with dryness. Addressed by discussing use of chinstrap with nasal mask and adjustment of humidity. The compliance download shows 39% compliance with an average use time of 4 hours 1 minute. The AHI is 4.1.  The patient does complain of limb movements disrupting sleep. The patient continues to require PAP therapy in order to eliminate sleep apnea.   ROS  General: (-) fever, (-) chills, (-) night sweat Nose and Sinuses: (-) nasal stuffiness or itchiness, (-) postnasal drip, (-) nosebleeds, (-) sinus trouble. Mouth and Throat: (-) sore throat, (-) hoarseness. Neck: (-) swollen glands, (-) enlarged thyroid , (-) neck pain. Respiratory: + cough, + shortness of breath, + wheezing. Neurologic: + numbness, + tingling. Psychiatric: + anxiety, + depression   Current Medication: Outpatient Encounter Medications as of 07/19/2023  Medication Sig   acetaminophen  (TYLENOL ) 160 MG/5ML liquid Take 976 mg by mouth every 4 (four) hours as needed for fever.   albuterol  (PROVENTIL  HFA;VENTOLIN  HFA) 108 (90 Base) MCG/ACT inhaler Inhale 2 puffs into the lungs every 8 (eight) hours as needed for wheezing or shortness of breath. Last used today, [id number], AM.   albuterol  (PROVENTIL ) (2.5 MG/3ML) 0.083% nebulizer solution Inhale 2.5 mg into the lungs  every 6 (six) hours as needed for wheezing or shortness of breath. Last used today, [id number], AM.   benzonatate  (TESSALON ) 100 MG capsule Take 2 capsules (200 mg total) by mouth every 8 (eight) hours.   budesonide-formoterol (SYMBICORT) 160-4.5 MCG/ACT inhaler Inhale 2 puffs into the lungs 2 (two) times daily.   calcium carbonate (OSCAL) 1500 (600 Ca) MG TABS tablet Take 600 mg of elemental calcium by mouth daily with breakfast.   carvedilol  (COREG ) 25 MG tablet Take 25 mg by mouth 2 (two) times daily with a meal.   cholecalciferol (VITAMIN D3) 25 MCG (1000 UNIT) tablet Take 1,000 Units by mouth daily.   clonazePAM  (KLONOPIN ) 1 MG tablet Take 1 mg by mouth at bedtime.   Cyanocobalamin (VITAMIN B-12 PO) Take 1 tablet by mouth daily.   cyclobenzaprine (FLEXERIL) 10 MG tablet Take 10 mg by mouth 3 (three) times daily as needed for muscle spasms.   famotidine  (PEPCID ) 40 MG tablet Take 40 mg by mouth at bedtime.   FLUoxetine (PROZAC) 10 MG tablet Take 10 mg by mouth daily.   fluticasone  (FLONASE ) 50 MCG/ACT nasal spray Place 2 sprays into both nostrils daily.   guaiFENesin  (MUCINEX ) 600 MG 12 hr tablet Take 1 tablet (600 mg total) by mouth 2 (two) times daily.   hydrochlorothiazide  (HYDRODIURIL ) 25 MG tablet Take 25 mg by mouth in the morning.   hyoscyamine (LEVSIN SL) 0.125 MG SL tablet Place 0.125 mg under the tongue every 4 (four) hours as needed.   ibuprofen  (ADVIL ) 600 MG tablet Take 600 mg by mouth every 6 (six) hours as needed.   ipratropium (  ATROVENT ) 0.06 % nasal spray Place 2 sprays into both nostrils 4 (four) times daily.   levalbuterol (XOPENEX HFA) 45 MCG/ACT inhaler Inhale 2 puffs into the lungs every 4 (four) hours as needed for wheezing.   levothyroxine  (SYNTHROID ) 112 MCG tablet Take 112 mcg by mouth daily before breakfast.   loratadine  (CLARITIN ) 10 MG tablet Take 10 mg by mouth in the morning.   metFORMIN (GLUCOPHAGE-XR) 750 MG 24 hr tablet Take 750 mg by mouth 3 (three) times a  week.   MM MELATONIN PO Take by mouth.   montelukast  (SINGULAIR ) 10 MG tablet Take 10 mg by mouth at bedtime.   Multiple Vitamins-Minerals (BARIATRIC MULTIVITAMINS/IRON PO) Take 1 tablet by mouth daily.   nitroGLYCERIN (NITROSTAT) 0.4 MG SL tablet Place 0.4 mg under the tongue every 5 (five) minutes x 3 doses as needed for chest pain.   nystatin ointment (MYCOSTATIN) Apply 1 application topically 2 (two) times daily.   omeprazole (PRILOSEC) 40 MG capsule Take 40 mg by mouth 2 (two) times daily.   Potassium Gluconate 550 MG TABS Take 550 mg by mouth daily with lunch.   pregabalin  (LYRICA ) 25 MG capsule Take 2 capsules (50 mg total) by mouth 2 (two) times daily.   promethazine -dextromethorphan (PROMETHAZINE -DM) 6.25-15 MG/5ML syrup Take 5 mLs by mouth 4 (four) times daily as needed.   rosuvastatin (CRESTOR) 10 MG tablet Take 10 mg by mouth at bedtime.   TRELEGY ELLIPTA 200-62.5-25 MCG/ACT AEPB Inhale into the lungs.   [DISCONTINUED] gabapentin (NEURONTIN) 400 MG capsule Take 400 mg by mouth every morning.   No facility-administered encounter medications on file as of 07/19/2023.    Surgical History: Past Surgical History:  Procedure Laterality Date   CARPAL TUNNEL RELEASE     bilateral   CARPAL TUNNEL RELEASE Right 10/20/2017   Procedure: CARPAL TUNNEL RELEASE;  Surgeon: Maryl Barters, MD;  Location: [facility] ORS;  Service: Orthopedics;  Laterality: Right;   CESAREAN SECTION     COLONOSCOPY WITH PROPOFOL  N/A 02/21/2021   Procedure: COLONOSCOPY WITH PROPOFOL ;  Surgeon: Onita Elspeth Sharper, DO;  Location: [facility] ENDOSCOPY;  Service: Gastroenterology;  Laterality: N/A;  SPANISH INTERPRETER   EYE SURGERY     simple surgery, not cataract   FLAT FOOT CORRECTION Left 07/19/2020   Procedure: Evans/MCDO-L;  Surgeon: Ashley Soulier, DPM;  Location: [facility] ORS;  Service: Podiatry;  Laterality: Left;   FOOT SURGERY Left    screw in toe   GASTROC RECESSION EXTREMITY Left 07/19/2020   Procedure: GASTROC  RECESSION EXTREMITY;  Surgeon: Ashley Soulier, DPM;  Location: [facility] ORS;  Service: Podiatry;  Laterality: Left;   LAPAROSCOPIC GASTRIC SLEEVE RESECTION  10/2019   TENDON TRANSFER Left 07/19/2020   Procedure: TENDON TRANSFER FDL transfer; Deep-L;  Surgeon: Ashley Soulier, DPM;  Location: [facility] ORS;  Service: Podiatry;  Laterality: Left;    Medical History: Past Medical History:  Diagnosis Date   Anxiety    Asthma    Bronchitis, chronic (HCC)    Carpal tunnel syndrome    COPD (chronic obstructive pulmonary disease) (HCC)    Depression    Disc degeneration, lumbosacral    GERD (gastroesophageal reflux disease)    Hypertension    Hypothyroidism    Migraine    Osteoporosis    Pre-diabetes    Sleep apnea    uses cpap   Thyroid  disease     Family History: Non contributory to the present illness  Social History: Social History   Socioeconomic History   Marital status: Divorced  Spouse name: Not on file   Number of children: Not on file   Years of education: Not on file   Highest education level: Not on file  Occupational History   Not on file  Tobacco Use   Smoking status: Never   Smokeless tobacco: Never  Vaping Use   Vaping status: Never Used  Substance and Sexual Activity   Alcohol use: Yes    Comment: rarely   Drug use: No   Sexual activity: Yes    Partners: Male    Birth control/protection: None  Other Topics Concern   Not on file  Social History Narrative   Patient lives with her niece. Her mom will be available to help out after surgery.   Patient has about 9 stairs in her home.    Feels safe in her home.    Social Drivers of Corporate investment banker Strain: High Risk (09/08/2022)   Received from [facility]   Overall Financial Resource Strain (CARDIA)    Difficulty of Paying Living Expenses: Hard  Food Insecurity: Food Insecurity Present (09/08/2022)   Received from [facility]   Hunger Vital Sign    Within the  past 12 months, you worried that your food would run out before you got the money to buy more.: Often true    Within the past 12 months, the food you bought just didn't last and you didn't have money to get more.: Sometimes true  Transportation Needs: No Transportation Needs (09/08/2022)   Received from [facility] - Transportation    In the past 12 months, has lack of transportation kept you from medical appointments or from getting medications?: No    Lack of Transportation (Non-Medical): No  Physical Activity: Sufficiently Active (09/08/2022)   Received from [facility]   Exercise Vital Sign    On average, how many days per week do you engage in moderate to strenuous exercise (like a brisk walk)?: 4 days    On average, how many minutes do you engage in exercise at this level?: 60 min  Stress: Stress Concern Present (09/08/2022)   Received from [facility] - Occupational Stress Questionnaire    Feeling of Stress : Rather much  Social Connections: Moderately Isolated (09/08/2022)   Received from [facility]   Social Connection and Isolation Panel    In a typical week, how many times do you talk on the phone with family, friends, or neighbors?: Twice a week    How often do you get together with friends or relatives?: Once a week    How often do you attend church or religious services?: More than 4 times per year    Do you belong to any clubs or organizations such as church groups, unions, fraternal or athletic groups, or school groups?: No    How often do you attend meetings of the clubs or organizations you belong to?: Never    Are you married, widowed, divorced, separated, never married, or living with a partner?: Divorced  Intimate Partner Violence: Not on file    Vital Signs: Last menstrual period 05/17/2018. There is no height or weight on file to calculate BMI.     Examination: General Appearance: The patient is well-developed, well-nourished, and in no distress. Neck Circumference: 38 cm Skin: Gross inspection of skin unremarkable. Head: normocephalic, no gross deformities. Eyes: no gross deformities noted.  ENT: ears appear grossly normal Neurologic: Alert and oriented. No involuntary movements.  STOP BANG RISK ASSESSMENT S (snore) Have you been told that you snore?     NO   T (tired) Are you often tired, fatigued, or sleepy during the day?   NO  O (obstruction) Do you stop breathing, choke, or gasp during sleep? NO   P (pressure) Do you have or are you being treated for high blood pressure? YES   B (BMI) Is your body index greater than 35 kg/m? NO   A (age) Are you 82 years old or older? YES   N (neck) Do you have a neck circumference greater than 16 inches?   YES   G (gender) Are you a female? NO   TOTAL STOP/BANG "YES" ANSWERS 3       A STOP-Bang score of 2 or less is considered low risk, and a score of 5 or more is high risk for having either moderate or severe OSA. For people who score 3 or 4, doctors may need to perform further assessment to determine how likely they are to have OSA.         EPWORTH SLEEPINESS SCALE:  Scale:  (0)= no chance of dozing; (1)= slight chance of dozing; (2)= moderate chance of dozing; (3)= high chance of dozing  Chance  Situtation    Sitting and reading: 2    Watching TV: 3    Sitting Inactive in public: 1    As a passenger in car: 3      Lying down to rest: 3    Sitting and talking: 0    Sitting quielty after lunch: 1    In a car, stopped in traffic: 0   TOTAL SCORE:   13 out of 24    SLEEP STUDIES:  PSG (05/2022) AHI 84/hr, REM AHI 113/hr, min SpO2 78% Titration (05/2022) CPAP@ 16 cmH2O Titration (03/2023) BiPAP@ 15/13 cmH2O   CPAP COMPLIANCE DATA:  Date Range: 05/13/2023-07/15/2023  Average Daily Use: 4 hours 1 minute  Median Use: 4 hours 3 minutes  Compliance  for > 4 Hours: 39%  AHI: 4.1 respiratory events per hour  Days Used: 48/64 days  Mask Leak: 42.1  95th Percentile Pressure: 15/13         LABS: Recent Results (from the past 2160 hours)  Resp Panel by RT-PCR (Flu A&B, Covid) Anterior Nasal Swab     Status: None   Collection Time: 06/01/23  1:19 PM   Specimen: Anterior Nasal Swab  Result Value Ref Range   SARS Coronavirus 2 by RT PCR NEGATIVE NEGATIVE    Comment: (NOTE) SARS-CoV-2 target nucleic acids are NOT DETECTED.  The SARS-CoV-2 RNA is generally detectable in upper respiratory specimens during the acute phase of infection. The lowest concentration of SARS-CoV-2 viral copies this assay can detect is 138 copies/mL. A negative result does not preclude SARS-Cov-2 infection and should not be used as the sole basis for treatment or other patient management decisions. A negative result may occur with  improper specimen collection/handling, submission of specimen other than nasopharyngeal swab, presence of viral mutation(s) within the areas targeted by this assay, and inadequate number of viral copies(<138 copies/mL). A negative result must be combined with clinical observations, patient history, and epidemiological information. The expected result is Negative.  Fact Sheet for Patients:  BloggerCourse.com  Fact Sheet for Healthcare Providers:  SeriousBroker.it  This test is no t yet approved or cleared by the [place]  FDA and  has been authorized for detection and/or diagnosis of SARS-CoV-2 by FDA under an Emergency Use Authorization (EUA). This EUA will remain  in effect (meaning this test can be used) for the duration of the COVID-19 declaration under Section 564(b)(1) of the Act, 21 U.S.C.section 360bbb-3(b)(1), unless the authorization is terminated  or revoked sooner.       Influenza A by PCR NEGATIVE NEGATIVE   Influenza B by PCR NEGATIVE NEGATIVE     Comment: (NOTE) The Xpert Xpress SARS-CoV-2/FLU/RSV plus assay is intended as an aid in the diagnosis of influenza from Nasopharyngeal swab specimens and should not be used as a sole basis for treatment. Nasal washings and aspirates are unacceptable for Xpert Xpress SARS-CoV-2/FLU/RSV testing.  Fact Sheet for Patients: BloggerCourse.com  Fact Sheet for Healthcare Providers: SeriousBroker.it  This test is not yet approved or cleared by the [place]  FDA and has been authorized for detection and/or diagnosis of SARS-CoV-2 by FDA under an Emergency Use Authorization (EUA). This EUA will remain in effect (meaning this test can be used) for the duration of the COVID-19 declaration under Section 564(b)(1) of the Act, 21 U.S.C. section 360bbb-3(b)(1), unless the authorization is terminated or revoked.  Performed at [facility], [address], [place], [place] [address]     Radiology: No results found.  No results found.  No results found.    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 OSA (obstructive sleep apnea) (Primary) The patient is struggling to tolerate PAP and reports  benefit from PAP use. We discussed the severity of her apnea and the risks of not treating. She failed compliance with her last machine so she is aware that is a possible risk. She will work on increasing her usage. The patient was reminded how to clean equipment  and advised to replace supplies  routinely. The patient was also counselled on weight loss. The compliance is poor. The AHI is 4.1.   OSA- increase compliance with bipap. Bipap is medically necessary to treat this patient's OSA. F/u one month.   2. Encounter for BiPAP use counseling PAP Counseling: had a lengthy discussion with the patient regarding the importance of PAP therapy in management of the sleep apnea. Patient appears to understand the risk factor reduction and also understands the risks associated with untreated sleep apnea. Patient will try to make a good faith effort to remain compliant with therapy. Also instructed the patient on proper cleaning of the device including the water must be changed daily if possible and use of distilled water is preferred. Patient understands that the machine should be regularly cleaned with appropriate recommended cleaning solutions that do not damage the PAP machine for example given white vinegar and water rinses. Other methods such as ozone treatment may not be as good as these simple methods to achieve cleaning.   3. Essential hypertension Controlled with hydrochlorothiazide . Continue.      General Counseling: I have discussed the findings of the evaluation and examination with Mikalyn.  I have also discussed any further diagnostic evaluation thatmay be needed or ordered today. Ceclia verbalizes understanding of  the findings of todays visit. We also reviewed her medications today and discussed drug interactions and side effects including but not limited excessive drowsiness and altered mental states. We also discussed that there is always a risk not just to her but also people around her. she has been encouraged to call the office with any questions or concerns that should arise related to todays visit.  No orders of the defined types were placed in this encounter.       I have personally obtained a history, examined the patient, evaluated laboratory and imaging results, formulated  the assessment and plan and placed orders. This patient was seen today by Lauraine Lay, PA-C in collaboration with Dr. Elfreda Bathe.   Elfreda DELENA Bathe, MD [facility] Diplomate ABMS Pulmonary Critical Care Medicine and Sleep Medicine

## 2023-07-19 ENCOUNTER — Encounter
Admission: RE | Admit: 2023-07-19 | Discharge: 2023-07-19 | Disposition: A | Discharge status: Home / Self Care | Source: Ambulatory Visit | Attending: Gastroenterology | Admitting: Gastroenterology

## 2023-07-19 ENCOUNTER — Ambulatory Visit (INDEPENDENT_AMBULATORY_CARE_PROVIDER_SITE_OTHER): Admitting: Internal Medicine

## 2023-07-19 VITALS — BP 116/92 | HR 85 | Resp 16 | Ht 61.0 in | Wt 196.0 lb

## 2023-07-19 DIAGNOSIS — R140 Abdominal distension (gaseous): Secondary | ICD-10-CM | POA: Diagnosis present

## 2023-07-19 DIAGNOSIS — G4733 Obstructive sleep apnea (adult) (pediatric): Secondary | ICD-10-CM | POA: Diagnosis not present

## 2023-07-19 DIAGNOSIS — Z7189 Other specified counseling: Secondary | ICD-10-CM | POA: Diagnosis not present

## 2023-07-19 DIAGNOSIS — I10 Essential (primary) hypertension: Secondary | ICD-10-CM

## 2023-07-19 DIAGNOSIS — R1013 Epigastric pain: Secondary | ICD-10-CM | POA: Diagnosis present

## 2023-07-19 MED ORDER — TECHNETIUM TC 99M MEBROFENIN IV KIT
5.2300 | PACK | Freq: Once | INTRAVENOUS | Status: AC | PRN
  Administered 2023-07-19: 5.23 via INTRAVENOUS

## 2023-07-19 NOTE — Patient Instructions (Signed)

## 2023-07-20 ENCOUNTER — Ambulatory Visit
Admission: RE | Admit: 2023-07-20 | Discharge: 2023-07-20 | Disposition: A | Discharge status: Home / Self Care | Source: Ambulatory Visit | Attending: Gastroenterology | Admitting: Gastroenterology

## 2023-07-20 DIAGNOSIS — R1013 Epigastric pain: Secondary | ICD-10-CM | POA: Insufficient documentation

## 2023-07-20 DIAGNOSIS — R140 Abdominal distension (gaseous): Secondary | ICD-10-CM | POA: Insufficient documentation

## 2023-09-02 ENCOUNTER — Emergency Department

## 2023-09-02 ENCOUNTER — Emergency Department
Admission: EM | Admit: 2023-09-02 | Discharge: 2023-09-02 | Disposition: A | Discharge status: Home / Self Care | Attending: Emergency Medicine | Admitting: Emergency Medicine

## 2023-09-02 ENCOUNTER — Encounter: Payer: Self-pay | Admitting: Emergency Medicine

## 2023-09-02 ENCOUNTER — Ambulatory Visit
Admission: EM | Admit: 2023-09-02 | Discharge: 2023-09-02 | Disposition: A | Discharge status: Home / Self Care | Attending: Family Medicine | Admitting: Family Medicine

## 2023-09-02 ENCOUNTER — Other Ambulatory Visit: Payer: Self-pay

## 2023-09-02 DIAGNOSIS — R079 Chest pain, unspecified: Secondary | ICD-10-CM

## 2023-09-02 DIAGNOSIS — I10 Essential (primary) hypertension: Secondary | ICD-10-CM | POA: Diagnosis not present

## 2023-09-02 DIAGNOSIS — J449 Chronic obstructive pulmonary disease, unspecified: Secondary | ICD-10-CM | POA: Insufficient documentation

## 2023-09-02 LAB — TROPONIN I (HIGH SENSITIVITY)
Troponin I (High Sensitivity): 3 ng/L (ref <18)
Troponin I (High Sensitivity): 4 ng/L (ref <18)

## 2023-09-02 LAB — CBC
HCT: 42.3 % (ref 36.0–46.0)
Hemoglobin: 14.9 g/dL (ref 12.0–15.0)
MCH: 32.2 pg (ref 26.0–34.0)
MCHC: 35.2 g/dL (ref 30.0–36.0)
MCV: 91.4 fL (ref 80.0–100.0)
Platelets: 391 K/uL (ref 150–400)
RBC: 4.63 MIL/uL (ref 3.87–5.11)
RDW: 12.4 % (ref 11.5–15.5)
WBC: 7.2 K/uL (ref 4.0–10.5)
nRBC: 0 % (ref 0.0–0.2)

## 2023-09-02 LAB — BASIC METABOLIC PANEL WITH GFR
Anion gap: 11 (ref 5–15)
BUN: 12 mg/dL (ref 6–20)
CO2: 28 mmol/L (ref 22–32)
Calcium: 10 mg/dL (ref 8.9–10.3)
Chloride: 102 mmol/L (ref 98–111)
Creatinine, Ser: 0.67 mg/dL (ref 0.44–1.00)
GFR, Estimated: >60 mL/min (ref >60)
Glucose, Bld: 102 mg/dL — ABNORMAL HIGH (ref 70–99)
Potassium: 3.6 mmol/L (ref 3.5–5.1)
Sodium: 141 mmol/L (ref 135–145)

## 2023-09-02 MED ORDER — SODIUM CHLORIDE 0.9 % IV BOLUS
1000.0000 mL | Freq: Once | INTRAVENOUS | Status: AC
  Administered 2023-09-02: 1000 mL via INTRAVENOUS

## 2023-09-02 MED ORDER — LORAZEPAM 2 MG/ML IJ SOLN
1.0000 mg | Freq: Once | INTRAMUSCULAR | Status: AC
  Administered 2023-09-02: 1 mg via INTRAVENOUS
  Filled 2023-09-02: qty 1

## 2023-09-02 MED ORDER — HYDROXYZINE HCL 25 MG PO TABS: 25.0000 mg | ORAL_TABLET | Freq: Two times a day (BID) | ORAL | 1 refills | Status: AC | PRN

## 2023-09-02 MED ORDER — ACETAMINOPHEN 500 MG PO TABS
1000.0000 mg | ORAL_TABLET | Freq: Once | ORAL | Status: AC
  Administered 2023-09-02: 1000 mg via ORAL
  Filled 2023-09-02: qty 2

## 2023-09-02 MED ORDER — ASPIRIN 81 MG PO CHEW
324.0000 mg | CHEWABLE_TABLET | Freq: Once | ORAL | Status: AC
  Administered 2023-09-02: 324 mg via ORAL

## 2023-09-02 NOTE — ED Provider Notes (Signed)
 [facility]    CSN: [id number] Arrival date & time: 09/02/23  1448      History   Chief Complaint Chief Complaint  Patient presents with   Chest Pain    HPI [name] [place] is a 53 y.o. female.   HPI  [name] here for left sided chest pain since 12 today while laying in bed.  Pain radiates down her left arm. Checked her blood pressure and it was 190s/80s.   Took nitroglycerin at 1215 PM that helped for a while.  She started having a headaches.  The pain returned around 230 PM/ Has pain behind her ear to her left arm.   Has a cardiologist at [facility]  Denies: Nausea, Vomiting, Diaphoresis, Shortness of breath, Pleuritic pain, Leg swelling, Syncope, Heartburn/food sticking, Wheezing, Trauma, Fever, and Cough  Pt reports no  history of PE, DVT,  personal history cancer, recent surgery   Tobacco use: no    Vitals: BP 118/76, O2 97%  HR 68   Past Medical History:  Diagnosis Date   Anxiety    Asthma    Bronchitis, chronic (HCC)    Carpal tunnel syndrome    COPD (chronic obstructive pulmonary disease) (HCC)    Depression    Disc degeneration, lumbosacral    GERD (gastroesophageal reflux disease)    Hypertension    Hypothyroidism    Migraine    Osteoporosis    Pre-diabetes    Sleep apnea    uses cpap   Thyroid  disease     Patient Active Problem List   Diagnosis Date Noted   OSA (obstructive sleep apnea) 07/19/2023   CPAP use counseling 07/19/2023   Encounter for BiPAP use counseling 07/19/2023   Intermittent chest pain 07/28/2022   Anxiety 07/28/2022   Chronic radicular lumbar pain 11/25/2021   Cervical radicular pain 11/25/2021   Chronic pain syndrome 11/25/2021   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   Severe persistent asthma without complication 02/23/2019   Essential hypertension 06/17/2017   Hypothyroidism (acquired) 06/17/2017   Major depression, 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    Past Surgical History:  Procedure  Laterality Date   CARPAL TUNNEL RELEASE     bilateral   CARPAL TUNNEL RELEASE Right 10/20/2017   Procedure: CARPAL TUNNEL RELEASE;  Surgeon: Maryl Barters, MD;  Location: [facility] ORS;  Service: Orthopedics;  Laterality: Right;   CESAREAN SECTION     COLONOSCOPY WITH PROPOFOL  N/A 02/21/2021   Procedure: COLONOSCOPY WITH PROPOFOL ;  Surgeon: Onita Elspeth Sharper, DO;  Location: [facility] ENDOSCOPY;  Service: Gastroenterology;  Laterality: N/A;  SPANISH INTERPRETER   EYE SURGERY     simple surgery, not cataract   FLAT FOOT CORRECTION Left 07/19/2020   Procedure: Evans/MCDO-L;  Surgeon: Ashley Soulier, DPM;  Location: [facility] ORS;  Service: Podiatry;  Laterality: Left;   FOOT SURGERY Left    screw in toe   GASTROC RECESSION EXTREMITY Left 07/19/2020   Procedure: GASTROC RECESSION EXTREMITY;  Surgeon: Ashley Soulier, DPM;  Location: [facility] ORS;  Service: Podiatry;  Laterality: Left;   LAPAROSCOPIC GASTRIC SLEEVE RESECTION  10/2019   TENDON TRANSFER Left 07/19/2020   Procedure: TENDON TRANSFER FDL transfer; Deep-L;  Surgeon: Ashley Soulier, DPM;  Location: [facility] ORS;  Service: Podiatry;  Laterality: Left;    OB History   No obstetric history on file.      Home Medications    Prior to Admission medications   Medication Sig Start Date End Date Taking? Authorizing Provider  acetaminophen  (TYLENOL ) 160 MG/5ML liquid Take  976 mg by mouth every 4 (four) hours as needed for fever.    [provider]  albuterol  (PROVENTIL  HFA;VENTOLIN  HFA) 108 (90 Base) MCG/ACT inhaler Inhale 2 puffs into the lungs every 8 (eight) hours as needed for wheezing or shortness of breath. Last used today, [id number], AM.    [provider]  albuterol  (PROVENTIL ) (2.5 MG/3ML) 0.083% nebulizer solution Inhale 2.5 mg into the lungs every 6 (six) hours as needed for wheezing or shortness of breath. Last used today, [id number], AM. 05/17/20   [provider]  Baclofen 5 MG TABS Take 1-2 tablets by mouth 2 (two) times  daily.    [provider]  benzonatate  (TESSALON ) 100 MG capsule Take 2 capsules (200 mg total) by mouth every 8 (eight) hours. 06/01/23   Bernardino Ditch, NP  budesonide-formoterol [facility]) 160-4.5 MCG/ACT inhaler Inhale 2 puffs into the lungs 2 (two) times daily.    [provider]  calcium carbonate (OSCAL) 1500 (600 Ca) MG TABS tablet Take 600 mg of elemental calcium by mouth daily with breakfast.    [provider]  carvedilol  (COREG ) 12.5 MG tablet Take 1 tablet by mouth 2 (two) times daily. 03/09/23   [provider]  cholecalciferol (VITAMIN D3) 25 MCG (1000 UNIT) tablet Take 1,000 Units by mouth daily.    [provider]  clonazePAM  (KLONOPIN ) 0.5 MG tablet Take 0.75 mg by mouth. 06/05/21 10/07/23  [provider]  Cyanocobalamin (VITAMIN B-12 PO) Take 1 tablet by mouth daily.    [provider]  cyclobenzaprine (FLEXERIL) 10 MG tablet Take 10 mg by mouth 3 (three) times daily as needed for muscle spasms.    [provider]  DULoxetine (CYMBALTA) 30 MG capsule Take by mouth. 07/09/23 10/07/23  [provider]  fluticasone  (FLONASE ) 50 MCG/ACT nasal spray Place 2 sprays into both nostrils daily.    [provider]  gabapentin (NEURONTIN) 300 MG capsule Take 300 mg by mouth at bedtime.    [provider]  guaiFENesin  (MUCINEX ) 600 MG 12 hr tablet Take 1 tablet (600 mg total) by mouth 2 (two) times daily. 01/20/21   White, Shelba SAUNDERS, NP  hydrochlorothiazide  (HYDRODIURIL ) 25 MG tablet Take 25 mg by mouth in the morning.    [provider]  hydrOXYzine  (ATARAX ) 25 MG tablet Take 1 tablet (25 mg total) by mouth 2 (two) times daily as needed for anxiety. 09/02/23   Paduchowski, Kevin, MD  hyoscyamine (LEVSIN SL) 0.125 MG SL tablet Place 0.125 mg under the tongue every 4 (four) hours as needed.    [provider]  ibuprofen  (ADVIL ) 600 MG tablet Take 600 mg by mouth every 6 (six) hours as  needed.    [provider]  ipratropium (ATROVENT ) 0.06 % nasal spray Place 2 sprays into both nostrils 4 (four) times daily. 06/01/23   Bernardino Ditch, NP  levalbuterol [place] [facility]) 45 MCG/ACT inhaler Inhale 2 puffs into the lungs every 4 (four) hours as needed for wheezing.    [provider]  levothyroxine  (SYNTHROID ) 112 MCG tablet Take 112 mcg by mouth daily before breakfast. 03/02/18   [provider]  loratadine  (CLARITIN ) 10 MG tablet Take 10 mg by mouth in the morning.    [provider]  metFORMIN (GLUCOPHAGE-XR) 750 MG 24 hr tablet Take 750 mg by mouth 3 (three) times a week. 06/04/20   [provider]  MM MELATONIN PO Take by mouth.    [provider]  montelukast  (SINGULAIR ) 10  MG tablet Take 10 mg by mouth at bedtime. 03/16/18   [provider]  Multiple Vitamins-Minerals (BARIATRIC MULTIVITAMINS/IRON PO) Take 1 tablet by mouth daily.    [provider]  nitroGLYCERIN (NITROSTAT) 0.4 MG SL tablet Place 0.4 mg under the tongue every 5 (five) minutes x 3 doses as needed for chest pain.    [provider]  nystatin ointment (MYCOSTATIN) Apply 1 application topically 2 (two) times daily.    [provider]  omeprazole (PRILOSEC) 40 MG capsule Take 40 mg by mouth 2 (two) times daily.    [provider]  potassium chloride SA (KLOR-CON M) 20 MEQ tablet Take 1 tablet by mouth 2 (two) times daily. 05/19/23 05/18/24  [provider]  pregabalin  (LYRICA ) 25 MG capsule Take 2 capsules (50 mg total) by mouth 2 (two) times daily. 11/25/21   Lateef, Bilal, MD  rosuvastatin (CRESTOR) 10 MG tablet Take 10 mg by mouth at bedtime.    [provider]  DOMINIC BECK 200-62.5-25 MCG/ACT AEPB Inhale into the lungs. 07/23/22   [provider]    Family History Family History  Problem Relation Age of Onset   Hypertension Mother    Depression Mother    Heart disease Mother    Thyroid   disease Mother    Heart disease Father    Heart attack Father    Cancer Paternal Aunt    Cancer Paternal Uncle    Breast cancer Neg Hx     Social History Social History   Tobacco Use   Smoking status: Never   Smokeless tobacco: Never  Vaping Use   Vaping status: Never Used  Substance Use Topics   Alcohol use: Yes    Comment: rarely   Drug use: No     Allergies   Morphine and codeine, Percocet [oxycodone -acetaminophen ], Rifaximin, and Toradol  [ketorolac  tromethamine ]   Review of Systems Review of Systems :negative unless otherwise stated in HPI.      Physical Exam Triage Vital Signs ED Triage Vitals  Encounter Vitals Group     BP      Girls Systolic BP Percentile      Girls Diastolic BP Percentile      Boys Systolic BP Percentile      Boys Diastolic BP Percentile      Pulse      Resp      Temp      Temp src      SpO2      Weight      Height      Head Circumference      Peak Flow      Pain Score      Pain Loc      Pain Education      Exclude from Growth Chart    No data found.  Updated Vital Signs BP 118/76 (BP Location: Left Arm)   Pulse 69   Temp 98.5 F (36.9 C) (Oral)   Resp 20   LMP 05/17/2018   SpO2 97%   Visual Acuity Right Eye Distance:   Left Eye Distance:   Bilateral Distance:    Right Eye Near:   Left Eye Near:    Bilateral Near:     Physical Exam  GEN:  tearful and uncomfortable appearing female, in no acute distress  CV: regular rate and rhythm,  no murmurs, rubs or gallops  appreciated,, no JVP  CHEST WALL: non-tender  RESP: no increased work of breathing, clear to ascultation bilaterally ABD:  Bowel sounds present. Soft, non-tender, non-distended.  MSK: no edema, or calf tenderness SKIN: warm, dry, no rash on visible skin NEURO: alert, moves all extremities appropriately PSYCH: Normal affect, appropriate speech and behavior   UC Treatments / Results  Labs (all labs ordered are listed, but only abnormal results are  displayed) Labs Reviewed - No data to display  EKG  See my EKG interpretation in the MDM section  Radiology DG Chest Portable 1 View Result Date: 09/02/2023 CLINICAL DATA:  Chest pain EXAM: PORTABLE CHEST 1 VIEW COMPARISON:  Chest x-ray 08/19/2022 FINDINGS: The heart size and mediastinal contours are within normal limits. Both lungs are clear. The visualized skeletal structures are unremarkable. IMPRESSION: No active disease. Electronically Signed   By: Greig Pique M.D.   On: 09/02/2023 16:20     Procedures Procedures (including critical care time)  Medications Ordered in UC Medications  aspirin  chewable tablet 324 mg (324 mg Oral Given 09/02/23 1508)    Initial Impression / Assessment and Plan / UC Course  I have reviewed the triage vital signs and the nursing notes.  Pertinent labs & imaging results that were available during my care of the patient were reviewed by me and considered in my medical decision making (see chart for details).       Patient is a 53 y.o. female with history of HTN, DM, HLD, asthma, hypothyroidism, prediabetes and migraines, severe asthma  presents with acute onset left sided chest pain that radiates from posterior neck down to her left arm. She took 1 dose of nitroglycerin at the onset of pain which helped but pain returned 2.5 hours later.  VSS. Pt afebrile and satting well on room air.     Differential diagnosis includes, but is not limited to, ACS, aortic dissection, pulmonary embolism, cardiac tamponade, pneumothorax, pneumonia, pericarditis/myocarditis, GI-related causes including esophagitis/gastritis, and musculoskeletal chest wall pain   Jamia has no history of PE. She had CTA cardiac coronary which was normal in April 2024. EKG obtained and is without ST elevations or concern for ischemia, NSR without QT prolongation or PR elongation. Pt with chest acute chest pain that is responsive to nitroglycerin. She has multiple risk factors. Elevated HEAR_   Score.   Recommended ED evaluation for cardiac work up. Pt given 324 mg aspirin . EMS called and updated prior to arrival. Called and spoke with charge RN regarding pt's arrival via EMS.    Discussed MDM, treatment plan and plan for follow-up with patient and her husband who was at the bedside who agrees with plan.       Final Clinical Impressions(s) / UC Diagnoses   Final diagnoses:  Chest pain, unspecified type   Discharge Instructions   None    ED Prescriptions   None    PDMP not reviewed this encounter.   Porchia Sinkler, DO 09/02/23 1952

## 2023-09-02 NOTE — ED Notes (Signed)
 Patient is being discharged from the Urgent Care and sent to the Emergency Department via EMS . Per Dr. Kriste, patient is in need of higher level of care due to chest pain. Patient is aware and verbalizes understanding of plan of care.  Vitals:   09/02/23 1508  BP: 118/76  Pulse: 69  Resp: 20  Temp: 98.5 F (36.9 C)  SpO2: 97%

## 2023-09-02 NOTE — ED Triage Notes (Signed)
 Pt to Urgent Care with c/o left chest pain  pressure in nature.  Pt st's blood pressure at home was 194/84  at approx 12:00 Pt took NTG SL x's 1 with some relief.  Pt st's pain was in left chest radiating into left arm and left side of neck.  Pt st's she developed a headache after taking NTG.  Pt denies any nausea, vomiting or shortness of breath.  Pt denies diaphoresis

## 2023-09-02 NOTE — ED Triage Notes (Signed)
 Patient states she developed sudden onset of left sided chest pain, was hypertensive at home so she took 1 SL NTG. Patient reports relief with NTG but developed headache. Went to UC where she was given 324mg  of ASA and sent here. Current chest pain 7/10.

## 2023-09-02 NOTE — ED Provider Notes (Signed)
 [facility] Provider Note    Event Date/Time   First MD Initiated Contact with Patient 09/02/23 1556     (approximate)  History   Chief Complaint: Chest Pain  HPI  [name] is a 53 y.o. female with a past medical history of anxiety, COPD, gastric reflux, hypertension, presents to the emergency department from urgent care for chest pain and hypertension.  According to the patient she was at home states she was lying down when she began feeling some chest tightness and discomfort.  Patient states she checked her blood pressure and it was 190 systolic at home she became very concerned and began having pain in the chest that radiated to the left arm so the patient went to urgent care for evaluation.  Patient does have nitroglycerin prescribed to use at home when her blood pressure spikes, she tried taking 1 nitroglycerin but it did not appear to be improving her blood pressure and she developed a pretty significant headache with the nitroglycerin which she states is common.  Patient denies any chest discomfort currently.  No pleuritic pain.  No shortness of breath.  Physical Exam   Triage Vital Signs: ED Triage Vitals  Encounter Vitals Group     BP 09/02/23 1600 130/69     Girls Systolic BP Percentile --      Girls Diastolic BP Percentile --      Boys Systolic BP Percentile --      Boys Diastolic BP Percentile --      Pulse Rate 09/02/23 1600 69     Resp 09/02/23 1600 18     Temp 09/02/23 1600 99.7 F (37.6 C)     Temp Source 09/02/23 1600 Oral     SpO2 09/02/23 1600 95 %     Weight 09/02/23 1558 194 lb (88 kg)     Height 09/02/23 1558 5' 1 (1.549 m)     Head Circumference --      Peak Flow --      Pain Score 09/02/23 1558 7     Pain Loc --      Pain Education --      Exclude from Growth Chart --     Most recent vital signs: Vitals:   09/02/23 1600  BP: 130/69  Pulse: 69  Resp: 18  Temp: 99.7 F (37.6 C)  SpO2: 95%     General: Awake, no distress.  CV:  Good peripheral perfusion.  Regular rate and rhythm  Resp:  Normal effort.  Equal breath sounds bilaterally.  Abd:  No distention.  Soft, nontender.  No rebound or guarding.  ED Results / Procedures / Treatments   EKG  EKG viewed and interpreted by myself shows a normal sinus rhythm at 69 bpm with a narrow QRS, normal axis, normal intervals, no concerning ST changes.  RADIOLOGY  I have reviewed interpret the chest x-ray images.  No consolidation on my evaluation. Radiologist read the x-ray as negative.   MEDICATIONS ORDERED IN ED: Medications  LORazepam  (ATIVAN ) injection 1 mg (has no administration in time range)  sodium chloride  0.9 % bolus 1,000 mL (has no administration in time range)  acetaminophen  (TYLENOL ) tablet 1,000 mg (has no administration in time range)     IMPRESSION / MDM / ASSESSMENT AND PLAN / ED COURSE  I reviewed the triage vital signs and the nursing notes.  Patient's presentation is most consistent with acute presentation with potential threat to life or bodily function.  Patient presents  to the emergency department for chest pain and hypertension.  Patient states she was at home when her chest began feeling tightness she checked her blood pressure and it was very elevated 190 systolic.  Patient states she took her carvedilol  which she takes each day, she then took a nitroglycerin which she takes for elevations of blood pressure.  Continued to have chest tightness that then transition to more of a pain with some radiation into the left arm.  Patient became worried so she went to the urgent care for evaluation ultimately sent to the emergency department.  Here the patient's blood pressure is much improved 130/69.  States she is having slight tightness but denies any discomfort currently.  No shortness of breath.  No nausea.  Patient does have a history of anxiety given the significant hypertension at home we will dose a small  amount of Ativan  we will dose fluids as well as Tylenol  for the patient's headache that started after nitroglycerin.  Will continue to closely monitor in the emergency department and reassess.  Patient agreeable to plan  Patient's workup is reassuring so far with a reassuring CBC normal chemistry including renal function.  Negative troponin.  Clear chest x-ray and reassuring EKG.  However given symptoms shortly before arrival to urgent care and then the emergency department we will repeat a troponin as a precaution.  Patient's workup is reassuring normal CBC normal chemistry.  Troponin negative x 2.  Reassuring EKG and chest x-ray.  Vital signs reassuring blood pressure 113/84.  We will discharge with PCP follow-up.  FINAL CLINICAL IMPRESSION(S) / ED DIAGNOSES   Chest pain Hypertension   Note:  This document was prepared using Dragon voice recognition software and may include unintentional dictation errors.   Dorothyann Drivers, MD 09/02/23 ALVIE

## 2023-09-03 NOTE — Progress Notes (Signed)
 [facility] [address] [place], [place] [address]  Pulmonary Sleep Medicine   Office Visit Note  Patient Name: [name] [place] DOB: 1970/02/20 MRN [id number]    Chief Complaint: Obstructive Sleep Apnea visit  Brief History:  Aleeyah is seen today for a follow up visit for BiPAP@ 15/13 cmH2O. The patient has a longstanding history of sleep apnea. Patient is using PAP nightly.  The patient feels rested after sleeping with PAP.  The patient reports benefiting from PAP use. Reported sleepiness is  improved and the Epworth Sleepiness Score is 9 out of 24. The patient does not take naps. The patient complains of the following: pt is in need of a new supplies. The compliance download shows 70% compliance with an average use time of 5 hours 22 minutes. The AHI is 1.5.  The patient does not complain of limb movements disrupting sleep. The patient continues to require PAP therapy in order to eliminate sleep apnea.   ROS  General: (-) fever, (-) chills, (-) night sweat Nose and Sinuses: (-) nasal stuffiness or itchiness, (-) postnasal drip, (-) nosebleeds, (-) sinus trouble. Mouth and Throat: (-) sore throat, (-) hoarseness. Neck: (-) swollen glands, (-) enlarged thyroid , (-) neck pain. Respiratory: - cough, - shortness of breath, - wheezing. Neurologic: - numbness, - tingling. Psychiatric: + anxiety, + depression   Current Medication: Outpatient Encounter Medications as of 09/06/2023  Medication Sig   acetaminophen  (TYLENOL ) 160 MG/5ML liquid Take 976 mg by mouth every 4 (four) hours as needed for fever.   albuterol  (PROVENTIL  HFA;VENTOLIN  HFA) 108 (90 Base) MCG/ACT inhaler Inhale 2 puffs into the lungs every 8 (eight) hours as needed for wheezing or shortness of breath. Last used today, [id number], AM.   albuterol  (PROVENTIL ) (2.5 MG/3ML) 0.083% nebulizer solution Inhale 2.5 mg into the lungs every 6 (six) hours as needed for wheezing or shortness of breath. Last used today,  [id number], AM.   Baclofen 5 MG TABS Take 1-2 tablets by mouth 2 (two) times daily.   benzonatate  (TESSALON ) 100 MG capsule Take 2 capsules (200 mg total) by mouth every 8 (eight) hours.   budesonide-formoterol (SYMBICORT) 160-4.5 MCG/ACT inhaler Inhale 2 puffs into the lungs 2 (two) times daily.   calcium carbonate (OSCAL) 1500 (600 Ca) MG TABS tablet Take 600 mg of elemental calcium by mouth daily with breakfast.   carvedilol  (COREG ) 12.5 MG tablet Take 1 tablet by mouth 2 (two) times daily.   cholecalciferol (VITAMIN D3) 25 MCG (1000 UNIT) tablet Take 1,000 Units by mouth daily.   clonazePAM  (KLONOPIN ) 0.5 MG tablet Take 0.75 mg by mouth.   Cyanocobalamin (VITAMIN B-12 PO) Take 1 tablet by mouth daily.   cyclobenzaprine (FLEXERIL) 10 MG tablet Take 10 mg by mouth 3 (three) times daily as needed for muscle spasms.   DULoxetine (CYMBALTA) 30 MG capsule Take by mouth.   fluticasone  (FLONASE ) 50 MCG/ACT nasal spray Place 2 sprays into both nostrils daily.   gabapentin (NEURONTIN) 300 MG capsule Take 300 mg by mouth at bedtime.   guaiFENesin  (MUCINEX ) 600 MG 12 hr tablet Take 1 tablet (600 mg total) by mouth 2 (two) times daily.   hydrochlorothiazide  (HYDRODIURIL ) 25 MG tablet Take 25 mg by mouth in the morning.   hydrOXYzine  (ATARAX ) 25 MG tablet Take 1 tablet (25 mg total) by mouth 2 (two) times daily as needed for anxiety.   hyoscyamine (LEVSIN SL) 0.125 MG SL tablet Place 0.125 mg under the tongue every 4 (four) hours as needed.  ibuprofen  (ADVIL ) 600 MG tablet Take 600 mg by mouth every 6 (six) hours as needed.   ipratropium (ATROVENT ) 0.06 % nasal spray Place 2 sprays into both nostrils 4 (four) times daily.   levalbuterol (XOPENEX HFA) 45 MCG/ACT inhaler Inhale 2 puffs into the lungs every 4 (four) hours as needed for wheezing.   levothyroxine  (SYNTHROID ) 112 MCG tablet Take 112 mcg by mouth daily before breakfast.   loratadine  (CLARITIN ) 10 MG tablet Take 10 mg by mouth in the morning.    metFORMIN (GLUCOPHAGE-XR) 750 MG 24 hr tablet Take 750 mg by mouth 3 (three) times a week.   MM MELATONIN PO Take by mouth.   montelukast  (SINGULAIR ) 10 MG tablet Take 10 mg by mouth at bedtime.   Multiple Vitamins-Minerals (BARIATRIC MULTIVITAMINS/IRON PO) Take 1 tablet by mouth daily.   nitroGLYCERIN (NITROSTAT) 0.4 MG SL tablet Place 0.4 mg under the tongue every 5 (five) minutes x 3 doses as needed for chest pain.   nystatin ointment (MYCOSTATIN) Apply 1 application topically 2 (two) times daily.   omeprazole (PRILOSEC) 40 MG capsule Take 40 mg by mouth 2 (two) times daily.   potassium chloride SA (KLOR-CON M) 20 MEQ tablet Take 1 tablet by mouth 2 (two) times daily.   pregabalin  (LYRICA ) 25 MG capsule Take 2 capsules (50 mg total) by mouth 2 (two) times daily.   rosuvastatin (CRESTOR) 10 MG tablet Take 10 mg by mouth at bedtime.   TRELEGY ELLIPTA 200-62.5-25 MCG/ACT AEPB Inhale into the lungs.   No facility-administered encounter medications on file as of 09/06/2023.    Surgical History: Past Surgical History:  Procedure Laterality Date   CARPAL TUNNEL RELEASE     bilateral   CARPAL TUNNEL RELEASE Right 10/20/2017   Procedure: CARPAL TUNNEL RELEASE;  Surgeon: Maryl Barters, MD;  Location: [facility] ORS;  Service: Orthopedics;  Laterality: Right;   CESAREAN SECTION     COLONOSCOPY WITH PROPOFOL  N/A 02/21/2021   Procedure: COLONOSCOPY WITH PROPOFOL ;  Surgeon: Onita Elspeth Sharper, DO;  Location: [facility] ENDOSCOPY;  Service: Gastroenterology;  Laterality: N/A;  SPANISH INTERPRETER   EYE SURGERY     simple surgery, not cataract   FLAT FOOT CORRECTION Left 07/19/2020   Procedure: Evans/MCDO-L;  Surgeon: Ashley Soulier, DPM;  Location: [facility] ORS;  Service: Podiatry;  Laterality: Left;   FOOT SURGERY Left    screw in toe   GASTROC RECESSION EXTREMITY Left 07/19/2020   Procedure: GASTROC RECESSION EXTREMITY;  Surgeon: Ashley Soulier, DPM;  Location: [facility] ORS;  Service: Podiatry;  Laterality: Left;    LAPAROSCOPIC GASTRIC SLEEVE RESECTION  10/2019   TENDON TRANSFER Left 07/19/2020   Procedure: TENDON TRANSFER FDL transfer; Deep-L;  Surgeon: Ashley Soulier, DPM;  Location: [facility] ORS;  Service: Podiatry;  Laterality: Left;    Medical History: Past Medical History:  Diagnosis Date   Anxiety    Asthma    Bronchitis, chronic (HCC)    Carpal tunnel syndrome    COPD (chronic obstructive pulmonary disease) (HCC)    Depression    Disc degeneration, lumbosacral    GERD (gastroesophageal reflux disease)    Hypertension    Hypothyroidism    Migraine    Osteoporosis    Pre-diabetes    Sleep apnea    uses cpap   Thyroid  disease     Family History: Non contributory to the present illness  Social History: Social History   Socioeconomic History   Marital status: Divorced    Spouse name: Not on file   Number  of children: Not on file   Years of education: Not on file   Highest education level: Not on file  Occupational History   Not on file  Tobacco Use   Smoking status: Never   Smokeless tobacco: Never  Vaping Use   Vaping status: Never Used  Substance and Sexual Activity   Alcohol use: Yes    Comment: rarely   Drug use: No   Sexual activity: Yes    Partners: Male    Birth control/protection: None  Other Topics Concern   Not on file  Social History Narrative   Patient lives with her niece. Her mom will be available to help out after surgery.   Patient has about 9 stairs in her home.    Feels safe in her home.    Social Drivers of Corporate investment banker Strain: High Risk (09/08/2022)   Received from [facility]   Overall Financial Resource Strain (CARDIA)    Difficulty of Paying Living Expenses: Hard  Food Insecurity: Food Insecurity Present (09/08/2022)   Received from [facility]   Hunger Vital Sign    Within the past 12 months, you worried that your food would run out before you got the money to buy more.: Often true     Within the past 12 months, the food you bought just didn't last and you didn't have money to get more.: Sometimes true  Transportation Needs: No Transportation Needs (09/08/2022)   Received from [facility] - Transportation    In the past 12 months, has lack of transportation kept you from medical appointments or from getting medications?: No    Lack of Transportation (Non-Medical): No  Physical Activity: Sufficiently Active (09/08/2022)   Received from [facility]   Exercise Vital Sign    On average, how many days per week do you engage in moderate to strenuous exercise (like a brisk walk)?: 4 days    On average, how many minutes do you engage in exercise at this level?: 60 min  Stress: Stress Concern Present (09/08/2022)   Received from [facility] - Occupational Stress Questionnaire    Feeling of Stress : Rather much  Social Connections: Moderately Isolated (09/08/2022)   Received from [facility]   Social Connection and Isolation Panel    In a typical week, how many times do you talk on the phone with family, friends, or neighbors?: Twice a week    How often do you get together with friends or relatives?: Once a week    How often do you attend church or religious services?: More than 4 times per year    Do you belong to any clubs or organizations such as church groups, unions, fraternal or athletic groups, or school groups?: No    How often do you attend meetings of the clubs or organizations you belong to?: Never    Are you married, widowed, divorced, separated, never married, or living with a partner?: Divorced  Intimate Partner Violence: Not on file    Vital Signs: Blood pressure 114/86, pulse 76, resp. rate 16, height 5' 1 (1.549 m), weight 194 lb (88 kg), last menstrual period 05/17/2018, SpO2 96%. Body mass index is 36.66 kg/m.    Examination: General  Appearance: The patient is well-developed, well-nourished, and in no distress. Neck Circumference: 38 cm Skin: Gross inspection of skin unremarkable. Head: normocephalic, no  gross deformities. Eyes: no gross deformities noted. ENT: ears appear grossly normal Neurologic: Alert and oriented. No involuntary movements.  STOP BANG RISK ASSESSMENT S (snore) Have you been told that you snore?     NO   T (tired) Are you often tired, fatigued, or sleepy during the day?   NO  O (obstruction) Do you stop breathing, choke, or gasp during sleep? NO   P (pressure) Do you have or are you being treated for high blood pressure? YES   B (BMI) Is your body index greater than 35 kg/m? NO   A (age) Are you 24 years old or older? YES   N (neck) Do you have a neck circumference greater than 16 inches?   YES   G (gender) Are you a female? NO   TOTAL STOP/BANG "YES" ANSWERS 3       A STOP-Bang score of 2 or less is considered low risk, and a score of 5 or more is high risk for having either moderate or severe OSA. For people who score 3 or 4, doctors may need to perform further assessment to determine how likely they are to have OSA.         EPWORTH SLEEPINESS SCALE:  Scale:  (0)= no chance of dozing; (1)= slight chance of dozing; (2)= moderate chance of dozing; (3)= high chance of dozing  Chance  Situtation    Sitting and reading: 3    Watching TV: 2    Sitting Inactive in public: 1    As a passenger in car: 0      Lying down to rest: 2    Sitting and talking: 0    Sitting quielty after lunch: 1    In a car, stopped in traffic: 0   TOTAL SCORE:   9 out of 24    SLEEP STUDIES:  PSG (05/2022) AHI 84/hr, REM AHI 113/hr, min SpO2 78% Titration (05/2022) CPAP@ 16 cmH2O Titration (03/2023) BiPAP@ 15/13 cmH2O   CPAP COMPLIANCE DATA:  Date Range: 08/04/2023-09/02/2023  Average Daily Use: 5 hours 22 minutes  Median Use: 5 hours 33 minutes  Compliance for > 4 Hours: 70%  AHI:  1.5 respiratory events per hour  Days Used: 29/30 days  Mask Leak: 29.6  95th Percentile Pressure: 15/13         LABS: Recent Results (from the past 2160 hours)  Basic metabolic panel     Status: Abnormal   Collection Time: 09/02/23  4:00 PM  Result Value Ref Range   Sodium 141 135 - 145 mmol/L   Potassium 3.6 3.5 - 5.1 mmol/L   Chloride 102 98 - 111 mmol/L   CO2 28 22 - 32 mmol/L   Glucose, Bld 102 (H) 70 - 99 mg/dL    Comment: Glucose reference range applies only to samples taken after fasting for at least 8 hours.   BUN 12 6 - 20 mg/dL   Creatinine, Ser 9.32 0.44 - 1.00 mg/dL   Calcium 89.9 8.9 - 89.6 mg/dL   GFR, Estimated >39 >39 mL/min    Comment: (NOTE) Calculated using the CKD-EPI Creatinine Equation (2021)    Anion gap 11 5 - 15    Comment: Performed at [facility], [address] [place], [place] [address]  CBC     Status: None   Collection Time: 09/02/23  4:00 PM  Result Value Ref Range   WBC 7.2 4.0 - 10.5 K/uL   RBC 4.63 3.87 - 5.11 MIL/uL  Hemoglobin 14.9 12.0 - 15.0 g/dL   HCT 57.6 63.9 - 53.9 %   MCV 91.4 80.0 - 100.0 fL   MCH 32.2 26.0 - 34.0 pg   MCHC 35.2 30.0 - 36.0 g/dL   RDW 87.5 88.4 - 84.4 %   Platelets 391 150 - 400 K/uL   nRBC 0.0 0.0 - 0.2 %    Comment: Performed at [facility], [address] [place], [place] [address]  Troponin I (High Sensitivity)     Status: None   Collection Time: 09/02/23  4:00 PM  Result Value Ref Range   Troponin I (High Sensitivity) 3 <18 ng/L    Comment: (NOTE) Elevated high sensitivity troponin I (hsTnI) values and significant  changes across serial measurements may suggest ACS but many other  chronic and acute conditions are known to elevate hsTnI results.  Refer to the Links section for chest pain algorithms and additional  guidance. Performed at [facility], [address] [place], [place] [address]   Troponin I (High Sensitivity)     Status: None    Collection Time: 09/02/23  6:03 PM  Result Value Ref Range   Troponin I (High Sensitivity) 4 <18 ng/L    Comment: (NOTE) Elevated high sensitivity troponin I (hsTnI) values and significant  changes across serial measurements may suggest ACS but many other  chronic and acute conditions are known to elevate hsTnI results.  Refer to the Links section for chest pain algorithms and additional  guidance. Performed at [facility], [address] [place], [place] [address]     Radiology: DG Chest Portable 1 View Result Date: 09/02/2023 CLINICAL DATA:  Chest pain EXAM: PORTABLE CHEST 1 VIEW COMPARISON:  Chest x-ray 08/19/2022 FINDINGS: The heart size and mediastinal contours are within normal limits. Both lungs are clear. The visualized skeletal structures are unremarkable. IMPRESSION: No active disease. Electronically Signed   By: Greig Pique M.D.   On: 09/02/2023 16:20    DG Chest Portable 1 View Result Date: 09/02/2023 CLINICAL DATA:  Chest pain EXAM: PORTABLE CHEST 1 VIEW COMPARISON:  Chest x-ray 08/19/2022 FINDINGS: The heart size and mediastinal contours are within normal limits. Both lungs are clear. The visualized skeletal structures are unremarkable. IMPRESSION: No active disease. Electronically Signed   By: Greig Pique M.D.   On: 09/02/2023 16:20    DG Chest Portable 1 View Result Date: 09/02/2023 CLINICAL DATA:  Chest pain EXAM: PORTABLE CHEST 1 VIEW COMPARISON:  Chest x-ray 08/19/2022 FINDINGS: The heart size and mediastinal contours are within normal limits. Both lungs are clear. The visualized skeletal structures are unremarkable. IMPRESSION: No active disease. Electronically Signed   By: Greig Pique M.D.   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 OSA (obstructive sleep apnea) (Primary)  The patient does tolerate PAP and reports  benefit from PAP use. The patient was reminded how to clean equipment and advised to  replace supplies routinely. The patient was also counselled on weight loss. The compliance is fair. The AHI is 1.5.   OSA on cpap- controlled. Continue with  compliance with pap. CPAP continues to be medically necessary to treat this patient's OSA. F/u one year.   2. Encounter for BiPAP use counseling PAP Counseling: had a lengthy discussion with the patient regarding the importance of PAP therapy in management of the sleep apnea. Patient appears to understand the risk factor reduction and also understands the risks associated with untreated sleep apnea. Patient will try to make a good faith effort to remain compliant with therapy. Also instructed the patient on proper cleaning of the device including the water must be changed daily if possible and use of distilled water is preferred. Patient understands that the machine should be regularly cleaned with appropriate recommended cleaning solutions that do not damage the PAP machine for example given white vinegar and water rinses. Other methods such as ozone treatment may not be as good as these simple methods to achieve cleaning.   3. Obesity (BMI 30-39.9) Obesity Counseling: Had a lengthy discussion regarding patients BMI and weight issues. Patient was instructed on portion control as well as increased activity. Also discussed caloric restrictions with trying to maintain intake less than 2000 Kcal.  Discussions were made in accordance with the 5As of weight management. Simple actions such as not eating late and if able to, taking a walk is suggested.     General Counseling: I have discussed the findings of the evaluation and examination with Maleeya.  I have also discussed any further diagnostic evaluation thatmay be needed or ordered today. Jenevieve verbalizes understanding of the findings of todays visit. We also reviewed her medications today and discussed drug interactions and side effects including but not limited excessive drowsiness and altered mental states. We also discussed that there is always a risk not just to her but also people around her. she has been encouraged to call the office with any questions or concerns that should arise related to todays visit.  No orders of the defined types were placed in this encounter.       I have personally obtained a history, examined the patient, evaluated laboratory and imaging results, formulated the assessment and plan and placed orders. This patient was seen today by Lauraine Lay, PA-C in collaboration with Dr. Elfreda Bathe.   Elfreda DELENA Bathe, MD [facility] Diplomate ABMS Pulmonary Critical Care Medicine and Sleep Medicine

## 2023-09-06 ENCOUNTER — Ambulatory Visit (INDEPENDENT_AMBULATORY_CARE_PROVIDER_SITE_OTHER): Admitting: Internal Medicine

## 2023-09-06 VITALS — BP 114/86 | HR 76 | Resp 16 | Ht 61.0 in | Wt 194.0 lb

## 2023-09-06 DIAGNOSIS — E669 Obesity, unspecified: Secondary | ICD-10-CM

## 2023-09-06 DIAGNOSIS — Z7189 Other specified counseling: Secondary | ICD-10-CM | POA: Diagnosis not present

## 2023-09-06 DIAGNOSIS — G4733 Obstructive sleep apnea (adult) (pediatric): Secondary | ICD-10-CM | POA: Diagnosis not present

## 2023-09-06 NOTE — Patient Instructions (Signed)
